# Patient Record
Sex: Male | Born: 1960 | Race: White | Hispanic: No | Marital: Married | Smoking: Never smoker
Health system: Southern US, Community
[De-identification: ages and names within clinical notes are randomized; demographics above are authoritative.]

## PROBLEM LIST (undated history)

## (undated) DIAGNOSIS — I10 Essential (primary) hypertension: Secondary | ICD-10-CM

## (undated) DIAGNOSIS — M1990 Unspecified osteoarthritis, unspecified site: Secondary | ICD-10-CM

## (undated) DIAGNOSIS — E119 Type 2 diabetes mellitus without complications: Secondary | ICD-10-CM

## (undated) DIAGNOSIS — F1290 Cannabis use, unspecified, uncomplicated: Secondary | ICD-10-CM

## (undated) DIAGNOSIS — F419 Anxiety disorder, unspecified: Secondary | ICD-10-CM

## (undated) DIAGNOSIS — G4730 Sleep apnea, unspecified: Secondary | ICD-10-CM

## (undated) DIAGNOSIS — G4733 Obstructive sleep apnea (adult) (pediatric): Secondary | ICD-10-CM

## (undated) DIAGNOSIS — E669 Obesity, unspecified: Secondary | ICD-10-CM

## (undated) DIAGNOSIS — R072 Precordial pain: Secondary | ICD-10-CM

## (undated) DIAGNOSIS — M797 Fibromyalgia: Secondary | ICD-10-CM

## (undated) DIAGNOSIS — E785 Hyperlipidemia, unspecified: Secondary | ICD-10-CM

## (undated) HISTORY — PX: TONSILLECTOMY: SUR1361

## (undated) HISTORY — PX: KNEE ARTHROSCOPY: SUR90

## (undated) HISTORY — PX: OTHER SURGICAL HISTORY: SHX169

## (undated) HISTORY — PX: UVULECTOMY: SHX2631

## (undated) HISTORY — PX: HERNIA REPAIR: SHX51

---

## 1987-11-20 DIAGNOSIS — G4733 Obstructive sleep apnea (adult) (pediatric): Secondary | ICD-10-CM

## 1987-11-20 HISTORY — DX: Obstructive sleep apnea (adult) (pediatric): G47.33

## 2002-07-09 ENCOUNTER — Ambulatory Visit (HOSPITAL_COMMUNITY): Admission: RE | Admit: 2002-07-09 | Discharge: 2002-07-09 | Payer: Self-pay | Admitting: Pediatrics

## 2002-07-09 ENCOUNTER — Encounter: Payer: Self-pay | Admitting: Pediatrics

## 2003-02-02 ENCOUNTER — Ambulatory Visit: Admission: RE | Admit: 2003-02-02 | Discharge: 2003-02-02 | Payer: Self-pay | Admitting: Otolaryngology

## 2003-07-28 ENCOUNTER — Encounter (INDEPENDENT_AMBULATORY_CARE_PROVIDER_SITE_OTHER): Payer: Self-pay | Admitting: *Deleted

## 2003-07-28 ENCOUNTER — Ambulatory Visit (HOSPITAL_COMMUNITY): Admission: RE | Admit: 2003-07-28 | Discharge: 2003-07-29 | Payer: Self-pay | Admitting: Otolaryngology

## 2004-12-12 ENCOUNTER — Ambulatory Visit (HOSPITAL_COMMUNITY): Admission: RE | Admit: 2004-12-12 | Discharge: 2004-12-12 | Payer: Self-pay | Admitting: Pediatrics

## 2004-12-18 ENCOUNTER — Ambulatory Visit (HOSPITAL_COMMUNITY): Admission: RE | Admit: 2004-12-18 | Discharge: 2004-12-18 | Payer: Self-pay | Admitting: Pediatrics

## 2004-12-30 ENCOUNTER — Ambulatory Visit: Admission: RE | Admit: 2004-12-30 | Discharge: 2004-12-30 | Payer: Self-pay | Admitting: Otolaryngology

## 2004-12-30 ENCOUNTER — Ambulatory Visit: Payer: Self-pay | Admitting: Pulmonary Disease

## 2006-07-15 ENCOUNTER — Ambulatory Visit (HOSPITAL_COMMUNITY): Admission: RE | Admit: 2006-07-15 | Discharge: 2006-07-15 | Payer: Self-pay | Admitting: Pediatrics

## 2006-07-17 ENCOUNTER — Ambulatory Visit (HOSPITAL_COMMUNITY): Admission: RE | Admit: 2006-07-17 | Discharge: 2006-07-17 | Payer: Self-pay | Admitting: Pediatrics

## 2007-08-18 ENCOUNTER — Ambulatory Visit: Payer: Self-pay | Admitting: Orthopedic Surgery

## 2007-09-22 ENCOUNTER — Ambulatory Visit: Payer: Self-pay | Admitting: Orthopedic Surgery

## 2007-09-22 DIAGNOSIS — M722 Plantar fascial fibromatosis: Secondary | ICD-10-CM | POA: Insufficient documentation

## 2007-09-22 DIAGNOSIS — M25569 Pain in unspecified knee: Secondary | ICD-10-CM | POA: Insufficient documentation

## 2008-03-01 ENCOUNTER — Emergency Department (HOSPITAL_COMMUNITY): Admission: EM | Admit: 2008-03-01 | Discharge: 2008-03-01 | Payer: Self-pay | Admitting: Emergency Medicine

## 2009-11-19 HISTORY — PX: COLONOSCOPY: SHX174

## 2009-11-22 ENCOUNTER — Encounter: Payer: Self-pay | Admitting: Internal Medicine

## 2009-11-30 ENCOUNTER — Ambulatory Visit: Payer: Self-pay | Admitting: Internal Medicine

## 2009-11-30 ENCOUNTER — Ambulatory Visit (HOSPITAL_COMMUNITY): Admission: RE | Admit: 2009-11-30 | Discharge: 2009-11-30 | Payer: Self-pay | Admitting: Internal Medicine

## 2010-12-10 ENCOUNTER — Encounter: Payer: Self-pay | Admitting: Pediatrics

## 2010-12-19 NOTE — Letter (Signed)
Summary: Internal Other Domingo Dimes  Internal Other Domingo Dimes   Imported By: Cloria Spring LPN 60/45/4098 11:91:47  _____________________________________________________________________  External Attachment:    Type:   Image     Comment:   External Document

## 2011-04-06 NOTE — Procedures (Signed)
Lance Frazier, Lance Frazier              ACCOUNT NO.:  [id number]   MEDICAL RECORD NO.:  [id number]          PATIENT TYPE:  OUT   LOCATION:  SLEEP LAB                     FACILITY:  [facility]   PHYSICIAN:  Marcelyn Bruins, M.D. [facility] DATE OF BIRTH:  1961/11/13   DATE OF STUDY:  12/30/2004                              NOCTURNAL POLYSOMNOGRAM   REFERRING PHYSICIAN:  Suzanna Obey, MD   INDICATION FOR THE STUDY:  Hypersomnia with sleep apnea.   SLEEP ARCHITECTURE:  The patient had a total sleep time of 350 minutes with  a sleep efficiency of 83%. There was decreased slow wave sleep and adequate  REM. Sleep onset latency was prolonged at 63 minutes and REM latency was  normal.   IMPRESSION:  1.  Split-night study reveals severe obstructive sleep apnea/hypopnea      syndrome with 112 obstructive events noted in the first 63 minutes of      sleep. This gave the patient a respiratory disturbance index of 108      events per hour with O2 desaturation as low as 82%. Events were not      positional however, they were associated with loud snoring. By protocol      the patient was placed on a large ResMed continuous positive airway      pressure mask and titration was initiated. The patient was increased as      high as 9 cm with definite breakthrough events noted. I would consider a      final treatment pressure of 10-11 cm.  2.  No clinically significant cardiac arrhythmia.  3.  Large numbers of leg jerks with mild to moderate sleep disruption.      Clinical correlation is suggested.      KC/MEDQ  D:  01/15/2005 15:54:38  T:  01/15/2005 18:40:52  Job:  [id number]

## 2011-04-06 NOTE — Procedures (Signed)
NAME:  Lance Frazier, Lance Frazier NO.:  [id number]   MEDICAL RECORD NO.:  [id number]          PATIENT TYPE:  OUT   LOCATION:  RAD                           FACILITY:  [facility]   PHYSICIAN:  Darlin Priestly, MD  DATE OF BIRTH:  [birth year]   DATE OF PROCEDURE:  07/17/2006  DATE OF DISCHARGE:                                  ECHOCARDIOGRAM   [name] is a 50 year old male patient of Dr. Milford Cage with a history of  possible stroke. He is now referred for a 2-D echocardiogram to evaluate LV  function and valvular structure to rule out possible source of embolus.   The aortic is within normal limits at 3.9 cm.   The left atrium is within normal limits at 4.0 cm.   There were no clots seen. The patient appears to be sinus rhythm during the  procedure.   ____________ thickened at 1.4 and 1.3 cm, respectively.   The aortic valve appears to be structurally normal.   The mitral valve appears to be structurally normal with trivial mitral  regurgitation.   Structurally normal pulmonic valve with trivial pulmonic regurgitation.   Structurally normal tricuspid valve with trivial tricuspid regurgitation.   Left ventricular internal dimensions within normal limits at ____________  2.7 cm, respectively. There is good overall left ventricular function,  estimated EF of 60%.   Normal LV size and systolic function.   CONCLUSION:  1. Mild concentric left ventricular hypertrophy with normal left      ventricular systolic function estimated at 60%.  2. Structurally normal aortic valve.  3. Structurally normal mitral valve with trivial mitral regurgitation.  4. Trivial pulmonic regurgitation.  5. Structurally normal tricuspid valve with trivial tricuspid      regurgitation.  6. Normal right ventricular size and systolic function.  7. No intracardiac evidence of mass or thrombus present.      Darlin Priestly, MD  Electronically Signed     RHM/MEDQ  D:  07/17/2006  T:  07/18/2006   Job:  [id number]

## 2011-04-06 NOTE — Op Note (Signed)
NAME:  Lance Frazier, Lance Frazier                        ACCOUNT NO.:  [id number]   MEDICAL RECORD NO.:  [id number]                   PATIENT TYPE:  OIB   LOCATION:  2550                                 FACILITY:  [facility]   PHYSICIAN:  Suzanna Obey, M.D.                    DATE OF BIRTH:  10/01/1961   DATE OF PROCEDURE:  07/28/2003  DATE OF DISCHARGE:                                 OPERATIVE REPORT   PREOPERATIVE DIAGNOSIS:  Obstructive sleep apnea, deviated septum, turbinate  hypertrophy.   POSTOPERATIVE DIAGNOSIS:  Obstructive sleep apnea, deviated septum,  turbinate hypertrophy.   OPERATION PERFORMED:  Septoplasty, submucous resection of inferior  turbinates, uvulopalatopharyngoplasty, tonsillectomy.   SURGEON:  Suzanna Obey, M.D.   ANESTHESIA:  General endotracheal tube.   ESTIMATED BLOOD LOSS:  Less than approximately 50mL.   INDICATIONS FOR PROCEDURE:  The patient is a 50 year old who has had a  problem with obstructive breathing and disturbed sleep which he had an RDI  of 25 on his sleep study with oxygen desaturation.  He has not been able to  tolerate CPAP and now is a candidate for surgery.  He does have anatomy  amenable to correction.  He was informed of the risks and benefits of the  procedure including bleeding, infection, perforation, change in the external  appearance of the nose.  Chronic crusting and drying, numbness of the teeth,  velopharyngeal insufficiency, change in the voice, chronic pain, and risks  of the anesthetic.  All questions were answered and consent was obtained.   DESCRIPTION OF PROCEDURE:  The patient was taken to the operating room and  placed in supine position.  After adequate general endotracheal tube  anesthesia, he was placed in the supine position, prepped and draped in the  usual sterile fashion.  The right hemitransfixion incision was performed  after injecting with 1% lidocaine with 1:100,000 epinephrine in the septum  and inferior turbinates.   The right hemitransfixion incision was performed  raising a mucoperichondrial and mucoperiosteal flap.  The cartilage was  divided about 1 cm posterior to the caudal strut and the cartilage was  removed with a Therapist, nutritional.  The deviated portion of the bone was removed  with Jansen-Middleton forceps.  The inferior spur was removed with the 4 mm  osteotome.  This corrected the septal deflection.  The turbinates were  infractured, midline incision made with a 15 blade.  Mucosal flap elevated  superiorly.  The inferior mucosa and bone were removed with turbinate  scissors.  The edge was cauterized with suction cautery and the turbinated  was outfractured bilaterally and the flap was laid back down over the raw  surface.  The hemitransfixion incision was closed with interrupted 4-0  chromic and 4-0 plain gut quilting stitch was placed in the septum.  Telfa  rolls soaked in bacitracin were placed into the nose bilaterally and secured  with a  3-0 nylon.  The operation was directed to his mouth where a Crowe-  Davis mouth gag was inserted, retracted and suspended from a Mayo stand.  The patient had large tonsils and a very thick palate and uvula.  Electrocautery was used to dissect above the uvula and then dissecting into  the left anterior tonsillar pillar identifying the capsular tonsil and  removing it with electrocautery dissection.  The dissection was carried  across the palate into the right side.  Again the tonsil removed.  This  entire specimen, tonsils, palate and uvula were removed as an en bloc  specimen.  Suction cautery  was used to obtain hemostasis.  The palate and tonsillar fossa was then  closed with interrupted 3-0 Vicryl.  There was good hemostasis.  The CroweEarlene Plater was released and resuspended.  There was hemostasis present in all  locations.  The patient was then awakened and brought to recovery in stable  condition.  Counts were correct.                                                 Suzanna Obey, M.D.    Cordelia Pen  D:  07/28/2003  T:  07/28/2003  Job:  [id number]   cc:   Dr. Idelle Jo, Sidney Ace

## 2011-05-21 ENCOUNTER — Other Ambulatory Visit (HOSPITAL_COMMUNITY): Payer: Self-pay | Admitting: Pediatrics

## 2011-05-21 ENCOUNTER — Ambulatory Visit (HOSPITAL_COMMUNITY)
Admission: RE | Admit: 2011-05-21 | Discharge: 2011-05-21 | Disposition: A | Discharge status: Home / Self Care | Payer: BC Managed Care – PPO | Source: Ambulatory Visit | Attending: Pediatrics | Admitting: Pediatrics

## 2011-05-21 DIAGNOSIS — M79609 Pain in unspecified limb: Secondary | ICD-10-CM | POA: Insufficient documentation

## 2011-05-21 DIAGNOSIS — M7989 Other specified soft tissue disorders: Secondary | ICD-10-CM

## 2011-05-21 DIAGNOSIS — M79669 Pain in unspecified lower leg: Secondary | ICD-10-CM

## 2012-02-05 ENCOUNTER — Other Ambulatory Visit: Payer: Self-pay

## 2012-02-05 ENCOUNTER — Encounter (HOSPITAL_COMMUNITY)
Admission: RE | Admit: 2012-02-05 | Discharge: 2012-02-05 | Disposition: A | Discharge status: Home / Self Care | Payer: BC Managed Care – PPO | Source: Ambulatory Visit | Attending: General Surgery | Admitting: General Surgery

## 2012-02-05 ENCOUNTER — Encounter (HOSPITAL_COMMUNITY): Payer: Self-pay | Admitting: Pharmacy Technician

## 2012-02-05 ENCOUNTER — Encounter (HOSPITAL_COMMUNITY): Payer: Self-pay

## 2012-02-05 HISTORY — DX: Fibromyalgia: M79.7

## 2012-02-05 HISTORY — DX: Essential (primary) hypertension: I10

## 2012-02-05 HISTORY — DX: Anxiety disorder, unspecified: F41.9

## 2012-02-05 HISTORY — DX: Sleep apnea, unspecified: G47.30

## 2012-02-05 HISTORY — DX: Unspecified osteoarthritis, unspecified site: M19.90

## 2012-02-05 LAB — SURGICAL PCR SCREEN
MRSA, PCR: NEGATIVE
Staphylococcus aureus: POSITIVE — AB

## 2012-02-05 LAB — DIFFERENTIAL
Basophils Absolute: 0 10*3/uL (ref 0.0–0.1)
Basophils Relative: 0 % (ref 0–1)
Lymphocytes Relative: 35 % (ref 12–46)
Neutro Abs: 3.7 10*3/uL (ref 1.7–7.7)
Neutrophils Relative %: 53 % (ref 43–77)

## 2012-02-05 LAB — BASIC METABOLIC PANEL
Calcium: 9.4 mg/dL (ref 8.4–10.5)
Chloride: 102 mEq/L (ref 96–112)
Creatinine, Ser: 0.99 mg/dL (ref 0.50–1.35)
GFR calc Af Amer: >90 mL/min (ref >90)
GFR calc non Af Amer: >90 mL/min (ref >90)

## 2012-02-05 LAB — CBC
Platelets: 182 10*3/uL (ref 150–400)
RDW: 13 % (ref 11.5–15.5)
WBC: 7 10*3/uL (ref 4.0–10.5)

## 2012-02-05 NOTE — H&P (Signed)
  NTS SOAP Note  Vital Signs:  Vitals as of: 02/05/2012: Systolic 154: Diastolic 94: Heart Rate 80: Temp 97.45F: Height 48ft 7in: Weight 266Lbs 0 Ounces: OFC 0in: Respiratory Rate 0: O2 Saturation 0: Pain Level 7: BMI 42  BMI : 41.66 kg/m2  Subjective: This 51 Years 31 Months old Male presents for of  HEMORRHOIDS: ,Has had hemorrhoidal issues for many years.  Had thrombosed hemorrhoid incised in the past.  Is have ongoing issues with hemorrhoids.  Has some blood on toilet paper over the past few weeks.  Had a TCS several years ago which was unremarkable.  Review of Symptoms:  Constitutional:unremarkable Head:unremarkable Eyes:unremarkable Nose/Mouth/Throat:unremarkable Cardiovascular:unremarkable Respiratory:unremarkable Gastrointestinal:unremarkable Genitourinary:unremarkable Musculoskeletal:unremarkable Skin:unremarkable Hematolgic/Lymphatic:unremarkable Allergic/Immunologic:unremarkable   Past Medical History:Reviewed   Past Medical History  Surgical History: umbilical herniorrhaphy Medical Problems:  Diabetes, High Blood pressure Allergies: nkda Medications: metformin, alprazolam, celebrex, terazosin, celebrex, furosemide, vitamins, fish oil   Social History:Reviewed   Social History  Preferred Language: English ([place]) Race:  White Ethnicity: Not Hispanic / Latino Age: 51 Years 3 Months Marital Status:  M Alcohol:  No Recreational drug(s):  No   Smoking Status: Never smoker reviewed on 02/05/2012  Family History:Reviewed   Family History              Father:  Coronary Artery Disease             Mother:  Cancer-kidney    Objective Information: General:Well appearing, well nourished in no distress. Head:Atraumatic; no masses; no abnormalities Heart:RRR, no murmur or gallop.  Normal S1, S2.  No S3, S4.  Lungs:CTA bilaterally, no wheezes, rhonchi, rales.  Breathing  unlabored. Abdomen:Soft, NT/ND, no HSM, no masses. Prolapsed hemorrrhoid with mucosa present along the right lateral aspect of the anus.  No active bleeding noted.  Assessment:Hemorrhoidal disease  Diagnosis &amp; Procedure: DiagnosisCode: 455.0, ProcedureCode: 40981,    Plan:Scheduled for hemorrhoidectomy on 02/06/12.   Patient Education:Alternative treatments to surgery were discussed with patient (and family).Risks and benefits  of procedure were fully explained to the patient (and family) who gave informed consent. Patient/family questions were addressed.  Follow-up:Pending Surgery

## 2012-02-05 NOTE — Patient Instructions (Addendum)
18 Lance Frazier  02/05/2012   Your procedure is scheduled on:  02/06/2012  Report to [facility] at  1015  AM.  Call this number if you have problems the morning of surgery: [phone]   Remember:   Do not eat food:After Midnight.  May have clear liquids:until Midnight .  Clear liquids include soda, tea, black coffee, apple or grape juice, broth.  Take these medicines the morning of surgery with A SIP OF WATER: norco,hytrin,xanax   Do not wear jewelry, make-up or nail polish.  Do not wear lotions, powders, or perfumes. You may wear deodorant.  Do not shave 48 hours prior to surgery.  Do not bring valuables to the hospital.  Contacts, dentures or bridgework may not be worn into surgery.  Leave suitcase in the car. After surgery it may be brought to your room.  For patients admitted to the hospital, checkout time is 11:00 AM the day of discharge.   Patients discharged the day of surgery will not be allowed to drive home.  Name and phone number of your driver: family  Special Instructions: CHG Shower Use Special Wash: 1/2 bottle night before surgery and 1/2 bottle morning of surgery.   Please read over the following fact sheets that you were given: Pain Booklet, MRSA Information, Surgical Site Infection Prevention, Anesthesia Post-op Instructions and Care and Recovery After Surgery Hemorrhoidectomy Hemorrhoidectomy is surgery to remove hemorrhoids. Hemorrhoids are veins that have become swollen in the rectum. The rectum is the area from the bottom end of the intestines to the opening where bowel movements leave the body. Hemorrhoids can be uncomfortable. They can cause itching, bleeding and pain if a blood clot forms in them (thrombose). If hemorrhoids are small, surgery may not be needed. But if they cover a larger area, surgery is usually suggested.  LET YOUR CAREGIVER KNOW ABOUT:   Any allergies.   All medications you are taking, including:   Herbs, eyedrops, over-the-counter  medications and creams.   Blood thinners (anticoagulants), aspirin or other drugs that could affect blood clotting.   Use of steroids (by mouth or as creams).   Previous problems with anesthetics, including local anesthetics.   Possibility of pregnancy, if this applies.   Any history of blood clots.   Any history of bleeding or other blood problems.   Previous surgery.   Smoking history.   Other health problems.  RISKS AND COMPLICATIONS All surgery carries some risk. However, hemorrhoid surgery usually goes smoothly. Possible complications could include:  Urinary retention.   Bleeding.   Infection.   A painful incision.   A reaction to the anesthesia (this is not common).  BEFORE THE PROCEDURE   Stop using aspirin and non-steroidal anti-inflammatory drugs (NSAIDs) for pain relief. This includes prescription drugs and over-the-counter drugs such as ibuprofen and naproxen. Also stop taking vitamin E. If possible, do this two weeks before your surgery.   If you take blood-thinners, ask your healthcare provider when you should stop taking them.   You will probably have blood and urine tests done several days before your surgery.   Do not eat or drink for about 8 hours before the surgery.   Arrive at least an hour before the surgery, or whenever your surgeon recommends. This will give you time to check in and fill out any needed paperwork.   Hemorrhoidectomy is often an outpatient procedure. This means you will be able to go home the same day. Sometimes, though, people stay overnight in the  hospital after the procedure. Ask your surgeon what to expect. Either way, make arrangements in advance for someone to drive you home.  PROCEDURE   The preparation:   You will change into a hospital gown.   You will be given an IV. A needle will be inserted in your arm. Medication can flow directly into your body through this needle.   You might be given an enema to clear your  rectum.   Once in the operating room, you will probably lie on your side or be repositioned later to lying on your stomach.   You will be given anesthesia (medication) so you will not feel anything during the surgery. The surgery often is done with local anesthesia (the area near the hemorrhoids will be numb and you will be drowsy but awake). Sometimes, general anesthesia is used (you will be asleep during the procedure).   The procedure:   There are a few different procedures for hemorrhoids. Be sure to ask you surgeon about the procedure, the risks and benefits.   Be sure to ask about what you need to do to take care of the wound, if there is one.  AFTER THE PROCEDURE  You will stay in a recovery area until the anesthesia has worn off. Your blood pressure and pulse will be checked every so often.   You may feel a lot of pain in the area of the rectum.   Take all pain medication prescribed by your surgeon. Ask before taking any over-the-counter pain medicines.   Sometimes sitting in a warm bath can help relieve your pain.   To make sure you have bowel movements without straining:   You will probably need to take stool softeners (usually a pill) for a few days.   You should drink 8 to 10 glasses of water each day.   Your activity will be restricted for awhile. Ask your caregiver for a list of what you should and should not do while you recover.  Document Released: 09/02/2009 Document Revised: 10/25/2011 Document Reviewed: 09/02/2009 [facility] Patient Information 2012 [place], [place].PATIENT INSTRUCTIONS POST-ANESTHESIA  IMMEDIATELY FOLLOWING SURGERY:  Do not drive or operate machinery for the first twenty four hours after surgery.  Do not make any important decisions for twenty four hours after surgery or while taking narcotic pain medications or sedatives.  If you develop intractable nausea and vomiting or a severe headache please notify your doctor immediately.  FOLLOW-UP:  Please  make an appointment with your surgeon as instructed. You do not need to follow up with anesthesia unless specifically instructed to do so.  WOUND CARE INSTRUCTIONS (if applicable):  Keep a dry clean dressing on the anesthesia/puncture wound site if there is drainage.  Once the wound has quit draining you may leave it open to air.  Generally you should leave the bandage intact for twenty four hours unless there is drainage.  If the epidural site drains for more than 36-48 hours please call the anesthesia department.  QUESTIONS?:  Please feel free to call your physician or the hospital operator if you have any questions, and they will be happy to assist you.     [name] Anesthesia Department [address] [place] [phone]

## 2012-02-06 ENCOUNTER — Encounter (HOSPITAL_COMMUNITY): Payer: Self-pay | Admitting: Anesthesiology

## 2012-02-06 ENCOUNTER — Ambulatory Visit (HOSPITAL_COMMUNITY)
Admission: RE | Admit: 2012-02-06 | Discharge: 2012-02-06 | Disposition: A | Discharge status: Home / Self Care | Payer: BC Managed Care – PPO | Source: Ambulatory Visit | Attending: General Surgery | Admitting: General Surgery

## 2012-02-06 ENCOUNTER — Encounter (HOSPITAL_COMMUNITY)
Admission: RE | Disposition: A | Discharge status: Home / Self Care | Payer: Self-pay | Source: Ambulatory Visit | Attending: General Surgery

## 2012-02-06 ENCOUNTER — Ambulatory Visit (HOSPITAL_COMMUNITY): Payer: BC Managed Care – PPO | Admitting: Anesthesiology

## 2012-02-06 ENCOUNTER — Encounter (HOSPITAL_COMMUNITY): Payer: Self-pay | Admitting: *Deleted

## 2012-02-06 DIAGNOSIS — Z01812 Encounter for preprocedural laboratory examination: Secondary | ICD-10-CM | POA: Insufficient documentation

## 2012-02-06 DIAGNOSIS — K602 Anal fissure, unspecified: Secondary | ICD-10-CM | POA: Insufficient documentation

## 2012-02-06 DIAGNOSIS — K648 Other hemorrhoids: Secondary | ICD-10-CM | POA: Insufficient documentation

## 2012-02-06 DIAGNOSIS — E119 Type 2 diabetes mellitus without complications: Secondary | ICD-10-CM | POA: Insufficient documentation

## 2012-02-06 DIAGNOSIS — Z01810 Encounter for preprocedural cardiovascular examination: Secondary | ICD-10-CM | POA: Insufficient documentation

## 2012-02-06 DIAGNOSIS — K644 Residual hemorrhoidal skin tags: Secondary | ICD-10-CM | POA: Insufficient documentation

## 2012-02-06 DIAGNOSIS — I10 Essential (primary) hypertension: Secondary | ICD-10-CM | POA: Insufficient documentation

## 2012-02-06 HISTORY — PX: HEMORRHOID SURGERY: SHX153

## 2012-02-06 LAB — GLUCOSE, CAPILLARY: Glucose-Capillary: 118 mg/dL — ABNORMAL HIGH (ref 70–99)

## 2012-02-06 SURGERY — HEMORRHOIDECTOMY
Anesthesia: General | Site: Rectum | Wound class: Clean Contaminated

## 2012-02-06 MED ORDER — PROPOFOL 10 MG/ML IV BOLUS
INTRAVENOUS | Status: DC | PRN
  Administered 2012-02-06: 200 mg via INTRAVENOUS

## 2012-02-06 MED ORDER — LIDOCAINE VISCOUS 2 % MT SOLN
OROMUCOSAL | Status: AC
  Filled 2012-02-06: qty 15

## 2012-02-06 MED ORDER — FENTANYL CITRATE 0.05 MG/ML IJ SOLN
INTRAMUSCULAR | Status: DC | PRN
  Administered 2012-02-06: 50 ug via INTRAVENOUS
  Administered 2012-02-06 (×2): 25 ug via INTRAVENOUS
  Administered 2012-02-06 (×2): 50 ug via INTRAVENOUS

## 2012-02-06 MED ORDER — DEXAMETHASONE SODIUM PHOSPHATE 4 MG/ML IJ SOLN
INTRAMUSCULAR | Status: AC
  Filled 2012-02-06: qty 1

## 2012-02-06 MED ORDER — ERTAPENEM SODIUM 1 G IJ SOLR
1.0000 g | INTRAMUSCULAR | Status: DC | PRN
  Administered 2012-02-06: 1 g via INTRAVENOUS

## 2012-02-06 MED ORDER — DEXAMETHASONE SODIUM PHOSPHATE 4 MG/ML IJ SOLN
4.0000 mg | Freq: Once | INTRAMUSCULAR | Status: AC
  Administered 2012-02-06: 4 mg via INTRAVENOUS

## 2012-02-06 MED ORDER — ERTAPENEM SODIUM 1 G IJ SOLR: 1.0000 g | INTRAMUSCULAR | Status: DC

## 2012-02-06 MED ORDER — GLYCOPYRROLATE 0.2 MG/ML IJ SOLN
0.2000 mg | Freq: Once | INTRAMUSCULAR | Status: AC
  Administered 2012-02-06: 0.2 mg via INTRAVENOUS

## 2012-02-06 MED ORDER — ENOXAPARIN SODIUM 40 MG/0.4ML ~~LOC~~ SOLN
SUBCUTANEOUS | Status: AC
  Filled 2012-02-06: qty 0.4

## 2012-02-06 MED ORDER — SODIUM CHLORIDE 0.9 % IR SOLN
Status: DC | PRN
  Administered 2012-02-06: 1000 mL

## 2012-02-06 MED ORDER — BUPIVACAINE HCL (PF) 0.5 % IJ SOLN
INTRAMUSCULAR | Status: AC
  Filled 2012-02-06: qty 30

## 2012-02-06 MED ORDER — LIDOCAINE HCL 1 % IJ SOLN
INTRAMUSCULAR | Status: DC | PRN
  Administered 2012-02-06: 50 mg via INTRADERMAL

## 2012-02-06 MED ORDER — MUPIROCIN 2 % EX OINT
TOPICAL_OINTMENT | CUTANEOUS | Status: AC
  Filled 2012-02-06: qty 22

## 2012-02-06 MED ORDER — LIDOCAINE VISCOUS 2 % MT SOLN
OROMUCOSAL | Status: DC | PRN
  Administered 2012-02-06: 1 via OROMUCOSAL

## 2012-02-06 MED ORDER — FENTANYL CITRATE 0.05 MG/ML IJ SOLN
INTRAMUSCULAR | Status: AC
  Administered 2012-02-06: 50 ug via INTRAVENOUS
  Filled 2012-02-06: qty 2

## 2012-02-06 MED ORDER — ENOXAPARIN SODIUM 40 MG/0.4ML ~~LOC~~ SOLN
40.0000 mg | Freq: Once | SUBCUTANEOUS | Status: AC
  Administered 2012-02-06: 40 mg via SUBCUTANEOUS

## 2012-02-06 MED ORDER — SODIUM CHLORIDE 0.9 % IV SOLN
INTRAVENOUS | Status: AC
  Filled 2012-02-06: qty 1

## 2012-02-06 MED ORDER — LACTATED RINGERS IV SOLN
INTRAVENOUS | Status: DC
  Administered 2012-02-06: 11:00:00 via INTRAVENOUS

## 2012-02-06 MED ORDER — BUPIVACAINE HCL (PF) 0.5 % IJ SOLN
INTRAMUSCULAR | Status: DC | PRN
  Administered 2012-02-06: 6 mL

## 2012-02-06 MED ORDER — MIDAZOLAM HCL 2 MG/2ML IJ SOLN
1.0000 mg | INTRAMUSCULAR | Status: DC | PRN
  Administered 2012-02-06 (×2): 2 mg via INTRAVENOUS

## 2012-02-06 MED ORDER — MIDAZOLAM HCL 2 MG/2ML IJ SOLN
INTRAMUSCULAR | Status: AC
  Filled 2012-02-06: qty 2

## 2012-02-06 MED ORDER — FENTANYL CITRATE 0.05 MG/ML IJ SOLN
25.0000 ug | INTRAMUSCULAR | Status: DC | PRN
  Administered 2012-02-06 (×3): 50 ug via INTRAVENOUS

## 2012-02-06 MED ORDER — FENTANYL CITRATE 0.05 MG/ML IJ SOLN
INTRAMUSCULAR | Status: AC
  Filled 2012-02-06: qty 5

## 2012-02-06 MED ORDER — ONDANSETRON HCL 4 MG/2ML IJ SOLN: 4.0000 mg | Freq: Once | INTRAMUSCULAR | Status: DC | PRN

## 2012-02-06 MED ORDER — GLYCOPYRROLATE 0.2 MG/ML IJ SOLN
INTRAMUSCULAR | Status: AC
  Filled 2012-02-06: qty 1

## 2012-02-06 MED ORDER — OXYCODONE-ACETAMINOPHEN 7.5-325 MG PO TABS: 1.0000 | ORAL_TABLET | ORAL | Status: AC | PRN

## 2012-02-06 SURGICAL SUPPLY — 28 items
BAG HAMPER (MISCELLANEOUS) ×2 IMPLANT
CLOTH BEACON ORANGE TIMEOUT ST (SAFETY) ×2 IMPLANT
COVER SURGICAL LIGHT HANDLE (MISCELLANEOUS) ×4 IMPLANT
DECANTER SPIKE VIAL GLASS SM (MISCELLANEOUS) ×2 IMPLANT
DRAPE PROXIMA HALF (DRAPES) ×2 IMPLANT
ELECT REM PT RETURN 9FT ADLT (ELECTROSURGICAL) ×2
ELECTRODE REM PT RTRN 9FT ADLT (ELECTROSURGICAL) ×1 IMPLANT
FORMALIN 10 PREFIL 120ML (MISCELLANEOUS) ×2 IMPLANT
GLOVE BIO SURGEON STRL SZ7.5 (GLOVE) ×2 IMPLANT
GLOVE ECLIPSE 6.5 STRL STRAW (GLOVE) ×1 IMPLANT
GLOVE EXAM NITRILE MD LF STRL (GLOVE) ×1 IMPLANT
GLOVE INDICATOR 7.0 STRL GRN (GLOVE) ×1 IMPLANT
GOWN STRL REIN XL XLG (GOWN DISPOSABLE) ×5 IMPLANT
HEMOSTAT SURGICEL 4X8 (HEMOSTASIS) ×2 IMPLANT
KIT ROOM TURNOVER AP CYSTO (KITS) ×2 IMPLANT
LIGASURE IMPACT 36 18CM CVD LR (INSTRUMENTS) ×1 IMPLANT
MANIFOLD NEPTUNE II (INSTRUMENTS) ×2 IMPLANT
NDL HYPO 25X1 1.5 SAFETY (NEEDLE) ×1 IMPLANT
NEEDLE HYPO 25X1 1.5 SAFETY (NEEDLE) ×2 IMPLANT
NS IRRIG 1000ML POUR BTL (IV SOLUTION) ×2 IMPLANT
PACK PERI GYN (CUSTOM PROCEDURE TRAY) ×2 IMPLANT
PAD ARMBOARD 7.5X6 YLW CONV (MISCELLANEOUS) ×2 IMPLANT
SET BASIN LINEN APH (SET/KITS/TRAYS/PACK) ×2 IMPLANT
SHEARS HARMONIC 9CM CVD (BLADE) IMPLANT
SPONGE GAUZE 4X4 12PLY (GAUZE/BANDAGES/DRESSINGS) ×2 IMPLANT
SUT SILK 0 FSL (SUTURE) ×2 IMPLANT
SUT VIC AB 2-0 CT2 27 (SUTURE) ×2 IMPLANT
SYR CONTROL 10ML LL (SYRINGE) ×2 IMPLANT

## 2012-02-06 NOTE — Interval H&P Note (Signed)
History and Physical Interval Note:  02/06/2012 11:04 AM  Lance Frazier  has presented today for surgery, with the diagnosis of Hemorrhoids  The various methods of treatment have been discussed with the patient and family. After consideration of risks, benefits and other options for treatment, the patient has consented to  Procedure(s) (LRB): HEMORRHOIDECTOMY (N/A) as a surgical intervention .  The patients' history has been reviewed, patient examined, no change in status, stable for surgery.  I have reviewed the patients' chart and labs.  Questions were answered to the patient's satisfaction.     Franky Macho A

## 2012-02-06 NOTE — Anesthesia Preprocedure Evaluation (Signed)
Anesthesia Evaluation  Patient identified by MRN, date of birth, ID band Patient awake    Reviewed: Allergy & Precautions, H&P , NPO status , Patient's Chart, lab work & pertinent test results  Airway Mallampati: I TM Distance: >3 FB Neck ROM: Full    Dental  (+) Teeth Intact   Pulmonary sleep apnea (s/p UVPP) and Continuous Positive Airway Pressure Ventilation ,  breath sounds clear to auscultation        Cardiovascular hypertension, Pt. on medications Rhythm:Regular Rate:Normal     Neuro/Psych Anxiety  Neuromuscular disease    GI/Hepatic   Endo/Other  Diabetes mellitus-, Well Controlled, Type 2, Oral Hypoglycemic AgentsMorbid obesity  Renal/GU      Musculoskeletal  (+) Fibromyalgia -  Abdominal (+) + obese,   Peds  Hematology   Anesthesia Other Findings   Reproductive/Obstetrics                           Anesthesia Physical Anesthesia Plan  ASA: III  Anesthesia Plan: General   Post-op Pain Management:    Induction: Intravenous  Airway Management Planned: LMA  Additional Equipment:   Intra-op Plan:   Post-operative Plan: Extubation in OR  Informed Consent: I have reviewed the patients History and Physical, chart, labs and discussed the procedure including the risks, benefits and alternatives for the proposed anesthesia with the patient or authorized representative who has indicated his/her understanding and acceptance.     Plan Discussed with:   Anesthesia Plan Comments: (Possible GOT if LMA is not satisfactory.)        Anesthesia Quick Evaluation

## 2012-02-06 NOTE — Op Note (Signed)
Patient:  Lance Frazier  DOB:  [birth year]  MRN:  [id number]   Preop Diagnosis:  Hemorrhoidal disease  Postop Diagnosis:  Same, chronic anal fissure  Procedure:  Hemorrhoidectomy  Surgeon:  Franky Macho, M.D.  Anes:  General  Indications:  Patient is a 51 year old white male who presents with hemorrhoidal disease. The risks and benefits of the procedure including bleeding, infection, and recurrence of hemorrhoidal disease were fully explained to the patient, gave informed consent.  Procedure note:  Patient is placed in the lithotomy position after general anesthesia was administered. The perineum was prepped and draped using usual sterile technique with Betadine. Surgical site confirmation was performed.  On anoscopy, the patient had an internal hemorrhoid along with external hemorrhoidal skin tissue at approximately the 5:00 position. These were excised without difficulty using the LigaSure. In addition, the patient had evidence of a chronic anal fissure at the 4:00 position. The tissue was cauterized and the remaining mucosa was brought over this fissure using 2-0 Vicryl interrupted sutures. The was no significant anal strictures. No other mass lesions were noted. 0.5% Sensorcaine was instilled the surrounding perineum. Surgicel and Viscous Xylocaine rectal packing was then placed.  All tape and needle counts were correct the end of the procedure. Patient was awakened and transferred to PACU in stable condition.  Complications:  None  EBL:  Minimal  Specimen:  Hemorrhoidal tissue

## 2012-02-06 NOTE — Discharge Instructions (Signed)
Hemorrhoidectomy Care After Hemorrhoidectomy is the removal of enlarged (dilated) veins around the rectum. Until the surgical areas are healed, control of pain and avoiding constipation are the greatest challenges for patients.  For as long as 24 hours after receiving an anesthetic (the medication that made you sleep), and while taking narcotic pain relievers, you may feel dizzy, weak and drowsy. For that reason, the following information applies to the first 24-hour period following surgery, and continues for as long as you are taking narcotic pain medications.  Do not drive a car, ride a bicycle, participate in activities in which you could be hurt. Do not take public transportation until you are off narcotic pain medications and until your caregiver says it is okay.   Do not drink alcohol, take tranquilizers, or medications not prescribed or allowed by your surgical caregiver.   Do not sign important papers or contracts for at least 24 hours or while taking narcotic medications.   Have a responsible person with you for 24 hours.  RISKS AND COMPLICATIONS Some problems that may occur following this procedure include:  Infection. A germ starts growing in the tissue surrounding the site operated on. This can usually be treated with antibiotics.   Damage to the rectal sphincter could occur. This is the muscle that opens in your anus to allow a bowel movement. This could cause incontinence. This is uncommon.   Bleeding following surgery can be a complication of almost any surgery. Your surgeon takes every precaution to keep this from happening.   Complications of anesthesia.  HOME CARE INSTRUCTIONS  Avoid straining when having bowel movements.   Avoid heavy lifting (more than 10 pounds (4.5 kilograms)).   Only take over-the-counter or prescription medicines for pain, discomfort, or fever as directed by your caregiver.   Take hot sitz baths for 20 to 30 minutes, 3 to 4 times per day.   To  keep swelling down, apply an ice pack for twenty minutes three to four times per day between sitz baths. Use a towel between your skin and the ice pack. Do not do this if it causes too much discomfort.   Keep anal area clean and dry. Following a bowel movement, you can gently wash the area with tucks (available for purchase at a drugstore) or cotton swabs. Gently pat the area dry. Do not rub the area.   Eat a well balanced diet and drink 6 to 8 glasses of water every day to avoid constipation. A bulk laxative may be also be helpful.  SEEK MEDICAL CARE IF:   You have increasing pain or tenderness near or in the surgical site.   You are unable to eat or drink.   You develop nausea or vomiting.   You develop uncontrolled bleeding such as soaking two to three pads in one hour.   You have constipation, not helped by changing your diet or increasing your fluid intake. Pain medications are a common cause of constipation.   You have pain and redness (inflammation) extending outside the area of your surgery.   You develop an unexplained oral temperature above 102 F (38.9 C), or any other signs of infection.   You have any other questions or concerns following surgery.  Document Released: 01/26/2004 Document Revised: 10/25/2011 Document Reviewed: 04/25/2009 [employer] Patient Information 2012 [employer]. 

## 2012-02-06 NOTE — Transfer of Care (Signed)
Immediate Anesthesia Transfer of Care Note  Patient: Lance Frazier  Procedure(s) Performed: Procedure(s) (LRB): HEMORRHOIDECTOMY (N/A)  Patient Location: PACU  Anesthesia Type: General  Level of Consciousness: awake, alert  and oriented  Airway & Oxygen Therapy: Patient Spontanous Breathing  Post-op Assessment: Report given to PACU RN  Post vital signs: Reviewed and stable  Complications: No apparent anesthesia complications

## 2012-02-06 NOTE — Anesthesia Procedure Notes (Addendum)
Performed by: Glynn Octave E   Procedure Name: LMA Insertion Date/Time: 02/06/2012 11:21 AM Performed by: Moshe Salisbury Pre-anesthesia Checklist: Patient identified, Patient being monitored, Emergency Drugs available, Timeout performed and Suction available Patient Re-evaluated:Patient Re-evaluated prior to inductionOxygen Delivery Method: Circle System Utilized Preoxygenation: Pre-oxygenation with 100% oxygen Intubation Type: IV induction Ventilation: Mask ventilation without difficulty LMA: LMA inserted LMA Size: 4.0 and 5.0 Number of attempts: 1 Placement Confirmation: positive ETCO2 and breath sounds checked- equal and bilateral

## 2012-02-06 NOTE — Anesthesia Postprocedure Evaluation (Signed)
  Anesthesia Post-op Note  Patient: Lance Frazier  Procedure(s) Performed: Procedure(s) (LRB): HEMORRHOIDECTOMY (N/A)  Patient Location: PACU  Anesthesia Type: General  Level of Consciousness: awake, alert  and oriented  Airway and Oxygen Therapy: Patient Spontanous Breathing and Patient connected to face mask oxygen  Post-op Pain: none  Post-op Assessment: Post-op Vital signs reviewed, Patient's Cardiovascular Status Stable, Respiratory Function Stable, Patent Airway and No signs of Nausea or vomiting  Post-op Vital Signs: Reviewed and stable  Complications: No apparent anesthesia complications

## 2012-02-08 ENCOUNTER — Encounter (HOSPITAL_COMMUNITY): Payer: Self-pay | Admitting: General Surgery

## 2012-05-27 ENCOUNTER — Encounter (HOSPITAL_COMMUNITY): Payer: Self-pay | Admitting: *Deleted

## 2012-05-27 ENCOUNTER — Observation Stay (HOSPITAL_COMMUNITY)
Admission: EM | Admit: 2012-05-27 | Discharge: 2012-05-28 | Disposition: A | Discharge status: Home / Self Care | Payer: BC Managed Care – PPO | Attending: Internal Medicine | Admitting: Internal Medicine

## 2012-05-27 ENCOUNTER — Emergency Department (HOSPITAL_COMMUNITY): Payer: BC Managed Care – PPO

## 2012-05-27 DIAGNOSIS — M722 Plantar fascial fibromatosis: Secondary | ICD-10-CM

## 2012-05-27 DIAGNOSIS — I10 Essential (primary) hypertension: Secondary | ICD-10-CM | POA: Diagnosis present

## 2012-05-27 DIAGNOSIS — I498 Other specified cardiac arrhythmias: Secondary | ICD-10-CM | POA: Insufficient documentation

## 2012-05-27 DIAGNOSIS — E785 Hyperlipidemia, unspecified: Secondary | ICD-10-CM | POA: Diagnosis present

## 2012-05-27 DIAGNOSIS — M25569 Pain in unspecified knee: Secondary | ICD-10-CM

## 2012-05-27 DIAGNOSIS — IMO0001 Reserved for inherently not codable concepts without codable children: Secondary | ICD-10-CM

## 2012-05-27 DIAGNOSIS — R072 Precordial pain: Secondary | ICD-10-CM | POA: Diagnosis present

## 2012-05-27 DIAGNOSIS — R079 Chest pain, unspecified: Principal | ICD-10-CM

## 2012-05-27 DIAGNOSIS — M797 Fibromyalgia: Secondary | ICD-10-CM | POA: Diagnosis present

## 2012-05-27 DIAGNOSIS — F411 Generalized anxiety disorder: Secondary | ICD-10-CM | POA: Insufficient documentation

## 2012-05-27 DIAGNOSIS — E1169 Type 2 diabetes mellitus with other specified complication: Secondary | ICD-10-CM | POA: Diagnosis present

## 2012-05-27 DIAGNOSIS — E669 Obesity, unspecified: Secondary | ICD-10-CM | POA: Diagnosis present

## 2012-05-27 DIAGNOSIS — G4733 Obstructive sleep apnea (adult) (pediatric): Secondary | ICD-10-CM | POA: Diagnosis present

## 2012-05-27 DIAGNOSIS — E119 Type 2 diabetes mellitus without complications: Secondary | ICD-10-CM

## 2012-05-27 DIAGNOSIS — F1290 Cannabis use, unspecified, uncomplicated: Secondary | ICD-10-CM | POA: Diagnosis present

## 2012-05-27 DIAGNOSIS — F419 Anxiety disorder, unspecified: Secondary | ICD-10-CM | POA: Diagnosis present

## 2012-05-27 HISTORY — DX: Precordial pain: R07.2

## 2012-05-27 HISTORY — DX: Cannabis use, unspecified, uncomplicated: F12.90

## 2012-05-27 HISTORY — DX: Obstructive sleep apnea (adult) (pediatric): G47.33

## 2012-05-27 HISTORY — DX: Hyperlipidemia, unspecified: E78.5

## 2012-05-27 HISTORY — DX: Obesity, unspecified: E66.9

## 2012-05-27 LAB — D-DIMER, QUANTITATIVE: D-Dimer, Quant: 0.46 ug/mL-FEU (ref 0.00–0.48)

## 2012-05-27 LAB — BASIC METABOLIC PANEL
CO2: 26 mEq/L (ref 19–32)
Calcium: 9.1 mg/dL (ref 8.4–10.5)
GFR calc non Af Amer: >90 mL/min (ref >90)
Potassium: 4.3 mEq/L (ref 3.5–5.1)
Sodium: 138 mEq/L (ref 135–145)

## 2012-05-27 LAB — CBC WITH DIFFERENTIAL/PLATELET
Basophils Absolute: 0 10*3/uL (ref 0.0–0.1)
Eosinophils Relative: 3 % (ref 0–5)
Lymphocytes Relative: 38 % (ref 12–46)
Lymphs Abs: 2.4 10*3/uL (ref 0.7–4.0)
MCV: 87.7 fL (ref 78.0–100.0)
Neutrophils Relative %: 51 % (ref 43–77)
Platelets: 153 10*3/uL (ref 150–400)
RBC: 5.37 MIL/uL (ref 4.22–5.81)
RDW: 12.9 % (ref 11.5–15.5)
WBC: 6.2 10*3/uL (ref 4.0–10.5)

## 2012-05-27 LAB — CARDIAC PANEL(CRET KIN+CKTOT+MB+TROPI)
Relative Index: 2.3 (ref 0.0–2.5)
Total CK: 146 U/L (ref 7–232)
Troponin I: <0.3 ng/mL (ref <0.30)
Troponin I: <0.3 ng/mL (ref <0.30)

## 2012-05-27 LAB — GLUCOSE, CAPILLARY: Glucose-Capillary: 120 mg/dL — ABNORMAL HIGH (ref 70–99)

## 2012-05-27 LAB — TROPONIN I: Troponin I: <0.3 ng/mL (ref <0.30)

## 2012-05-27 MED ORDER — FAMOTIDINE 20 MG PO TABS
20.0000 mg | ORAL_TABLET | Freq: Every day | ORAL | Status: DC
  Administered 2012-05-27 – 2012-05-28 (×2): 20 mg via ORAL
  Filled 2012-05-27 (×2): qty 1

## 2012-05-27 MED ORDER — ACETAMINOPHEN 650 MG RE SUPP: 650.0000 mg | Freq: Four times a day (QID) | RECTAL | Status: DC | PRN

## 2012-05-27 MED ORDER — OMEGA-3-ACID ETHYL ESTERS 1 G PO CAPS
3.0000 g | ORAL_CAPSULE | Freq: Every day | ORAL | Status: DC
  Administered 2012-05-27 – 2012-05-28 (×2): 3 g via ORAL
  Filled 2012-05-27 (×2): qty 3

## 2012-05-27 MED ORDER — ALBUTEROL SULFATE (5 MG/ML) 0.5% IN NEBU: 2.5000 mg | INHALATION_SOLUTION | RESPIRATORY_TRACT | Status: DC | PRN

## 2012-05-27 MED ORDER — ALPRAZOLAM 1 MG PO TABS: 2.0000 mg | ORAL_TABLET | Freq: Every evening | ORAL | Status: DC | PRN

## 2012-05-27 MED ORDER — ADULT MULTIVITAMIN W/MINERALS CH
1.0000 | ORAL_TABLET | Freq: Every day | ORAL | Status: DC
  Administered 2012-05-27 – 2012-05-28 (×2): 1 via ORAL
  Filled 2012-05-27 (×2): qty 1

## 2012-05-27 MED ORDER — TERAZOSIN HCL 1 MG PO CAPS
1.0000 mg | ORAL_CAPSULE | Freq: Every day | ORAL | Status: DC
  Administered 2012-05-27: 1 mg via ORAL
  Filled 2012-05-27: qty 1

## 2012-05-27 MED ORDER — ALUM & MAG HYDROXIDE-SIMETH 200-200-20 MG/5ML PO SUSP: 30.0000 mL | Freq: Four times a day (QID) | ORAL | Status: DC | PRN

## 2012-05-27 MED ORDER — ASPIRIN EC 81 MG PO TBEC
81.0000 mg | DELAYED_RELEASE_TABLET | Freq: Every day | ORAL | Status: DC
  Administered 2012-05-28: 81 mg via ORAL
  Filled 2012-05-27: qty 1

## 2012-05-27 MED ORDER — OXYCODONE-ACETAMINOPHEN 5-325 MG PO TABS
1.0000 | ORAL_TABLET | ORAL | Status: DC | PRN
Start: 2012-05-27 — End: 2012-05-28
  Administered 2012-05-27: 2 via ORAL
  Filled 2012-05-27: qty 2

## 2012-05-27 MED ORDER — MORPHINE SULFATE 4 MG/ML IJ SOLN
4.0000 mg | INTRAMUSCULAR | Status: DC | PRN
  Administered 2012-05-27: 4 mg via INTRAVENOUS
  Filled 2012-05-27: qty 1

## 2012-05-27 MED ORDER — MORPHINE SULFATE 4 MG/ML IJ SOLN
4.0000 mg | Freq: Once | INTRAMUSCULAR | Status: AC
  Administered 2012-05-27: 4 mg via INTRAVENOUS

## 2012-05-27 MED ORDER — ENOXAPARIN SODIUM 40 MG/0.4ML ~~LOC~~ SOLN
40.0000 mg | SUBCUTANEOUS | Status: DC
  Administered 2012-05-27: 40 mg via SUBCUTANEOUS
  Filled 2012-05-27: qty 0.4

## 2012-05-27 MED ORDER — NITROGLYCERIN 0.4 MG SL SUBL: 0.4000 mg | SUBLINGUAL_TABLET | SUBLINGUAL | Status: DC | PRN

## 2012-05-27 MED ORDER — OXYCODONE-ACETAMINOPHEN 7.5-325 MG PO TABS: 1.0000 | ORAL_TABLET | ORAL | Status: DC | PRN

## 2012-05-27 MED ORDER — ONDANSETRON HCL 4 MG/2ML IJ SOLN
4.0000 mg | Freq: Once | INTRAMUSCULAR | Status: AC
  Administered 2012-05-27: 4 mg via INTRAVENOUS
  Filled 2012-05-27: qty 2

## 2012-05-27 MED ORDER — ASPIRIN 81 MG PO CHEW
324.0000 mg | CHEWABLE_TABLET | Freq: Once | ORAL | Status: AC
  Administered 2012-05-27: 324 mg via ORAL
  Filled 2012-05-27: qty 4

## 2012-05-27 MED ORDER — ONDANSETRON HCL 4 MG PO TABS: 4.0000 mg | ORAL_TABLET | Freq: Four times a day (QID) | ORAL | Status: DC | PRN

## 2012-05-27 MED ORDER — INSULIN ASPART 100 UNIT/ML ~~LOC~~ SOLN: 0.0000 [IU] | Freq: Three times a day (TID) | SUBCUTANEOUS | Status: DC

## 2012-05-27 MED ORDER — FUROSEMIDE 20 MG PO TABS
20.0000 mg | ORAL_TABLET | Freq: Every day | ORAL | Status: DC
  Administered 2012-05-28: 20 mg via ORAL
  Filled 2012-05-27: qty 1

## 2012-05-27 MED ORDER — CELECOXIB 100 MG PO CAPS: 100.0000 mg | ORAL_CAPSULE | ORAL | Status: DC | PRN

## 2012-05-27 MED ORDER — ONDANSETRON HCL 4 MG/2ML IJ SOLN: 4.0000 mg | Freq: Four times a day (QID) | INTRAMUSCULAR | Status: DC | PRN

## 2012-05-27 MED ORDER — MORPHINE SULFATE 4 MG/ML IJ SOLN
INTRAMUSCULAR | Status: AC
  Filled 2012-05-27: qty 1

## 2012-05-27 MED ORDER — ACETAMINOPHEN 325 MG PO TABS: 650.0000 mg | ORAL_TABLET | Freq: Four times a day (QID) | ORAL | Status: DC | PRN

## 2012-05-27 MED ORDER — NITROGLYCERIN 0.4 MG SL SUBL
0.4000 mg | SUBLINGUAL_TABLET | Freq: Once | SUBLINGUAL | Status: AC
  Administered 2012-05-27: 0.4 mg via SUBLINGUAL
  Filled 2012-05-27: qty 25

## 2012-05-27 MED ORDER — INSULIN ASPART 100 UNIT/ML ~~LOC~~ SOLN: 0.0000 [IU] | Freq: Every day | SUBCUTANEOUS | Status: DC

## 2012-05-27 MED ORDER — OXYCODONE HCL 5 MG PO TABS: 2.5000 mg | ORAL_TABLET | ORAL | Status: DC | PRN

## 2012-05-27 NOTE — ED Notes (Signed)
Second NTG SL given.

## 2012-05-27 NOTE — ED Notes (Signed)
MD at bedside. 

## 2012-05-27 NOTE — ED Notes (Signed)
Pt states that he is getting a HA

## 2012-05-27 NOTE — ED Notes (Signed)
CP x 3 days, states pain is getting worse, now with SOB, denies hx of CP, denies taking any ASA today

## 2012-05-27 NOTE — ED Provider Notes (Signed)
History     CSN: [id number]  Arrival date & time 05/27/12  1100   First MD Initiated Contact with Patient 05/27/12 1121      Chief Complaint  Patient presents with  . Chest Pain    (Consider location/radiation/quality/duration/timing/severity/associated sxs/prior treatment) HPI....anterior chest pain for 3 days.  pain is fleeting and occurs every 15 seconds.  No radiation. Not associated with exercise. Mild dyspnea. No diaphoresis or nausea. Pain is minimal to moderate.  Risk factors include diabetes, hypertension, family history (father died at 18 of an MI)  Past Medical History  Diagnosis Date  . Hypertension   . Arthritis   . Anxiety   . Sleep apnea   . Fibromyalgia   . Diabetes mellitus     Past Surgical History  Procedure Date  . Knee arthroscopy     right x2  . Uvulectomy   . Hernia repair     umbilical-[facility]  . Tonsillectomy   . Hemorrhoid surgery 02/06/2012    Procedure: HEMORRHOIDECTOMY;  Surgeon: Dalia Heading, MD;  Location: [facility];  Service: General;  Laterality: N/A;    Family History  Problem Relation Age of Onset  . Anesthesia problems Neg Hx   . Hypotension Neg Hx   . Malignant hyperthermia Neg Hx   . Pseudochol deficiency Neg Hx     History  Substance Use Topics  . Smoking status: Never Smoker   . Smokeless tobacco: Not on file  . Alcohol Use: No      Review of Systems  All other systems reviewed and are negative.    Allergies  Review of patient's allergies indicates no known allergies.  Home Medications   Current Outpatient Rx  Name Route Sig Dispense Refill  . ALPRAZOLAM 2 MG PO TABS Oral Take 2 mg by mouth at bedtime as needed. For sleep    . CELECOXIB 100 MG PO CAPS Oral Take 100 mg by mouth as needed. For foot pain    . OMEGA-3 FATTY ACIDS 1000 MG PO CAPS Oral Take 3 g by mouth daily.    . FUROSEMIDE 20 MG PO TABS Oral Take 20 mg by mouth.    . METFORMIN HCL 1000 MG PO TABS Oral Take 1,000 mg by mouth 2 (two) times daily.      . ADULT MULTIVITAMIN W/MINERALS CH Oral Take 1 tablet by mouth daily.    . OXYCODONE-ACETAMINOPHEN 7.5-325 MG PO TABS Oral Take 1-2 tablets by mouth every 4 (four) hours as needed for pain. 50 tablet 0  . TERAZOSIN HCL 1 MG PO CAPS Oral Take 1 mg by mouth at bedtime.      BP 124/72  Pulse 78  Temp 98.7 F (37.1 C) (Oral)  Resp 16  Ht 5\' 8"  (1.727 m)  Wt 260 lb (117.935 kg)  BMI 39.53 kg/m2  SpO2 97%  Physical Exam  Nursing note and vitals reviewed. Constitutional: He is oriented to person, place, and time. He appears well-developed and well-nourished.  HENT:  Head: Normocephalic and atraumatic.  Eyes: Conjunctivae and EOM are normal. Pupils are equal, round, and reactive to light.  Neck: Normal range of motion. Neck supple.  Cardiovascular: Normal rate and regular rhythm.   Pulmonary/Chest: Effort normal and breath sounds normal.  Abdominal: Soft. Bowel sounds are normal.  Musculoskeletal: Normal range of motion.  Neurological: He is alert and oriented to person, place, and time.  Skin: Skin is warm and dry.  Psychiatric: He has a normal mood and affect.  ED Course  Procedures (including critical care time)  Labs Reviewed  BASIC METABOLIC PANEL - Abnormal; Notable for the following:    Glucose, Bld 129 (*)     All other components within normal limits  CBC WITH DIFFERENTIAL  TROPONIN I  D-DIMER, QUANTITATIVE   Dg Chest Portable 1 View  05/27/2012  *RADIOLOGY REPORT*  Clinical Data: Chest pain.  Hypertension.  Diabetes.  PORTABLE CHEST - 1 VIEW  Comparison: None.  Findings: Normal sized heart.  Clear lungs with normal vascularity. Mild central peribronchial thickening.  Thoracic spine degenerative changes.  IMPRESSION: Mild bronchitic changes.  Original Report Authenticated By: Darrol Angel, M.D.     No diagnosis found.   Date: 05/27/2012  Rate: 66  Rhythm: normal sinus rhythm  QRS Axis: normal  Intervals: normal  ST/T Wave abnormalities: normal  Conduction  Disutrbances: none  Narrative Interpretation: unremarkable     MDM  Patient has cardiac risk factors. History is moderate. Nitroglycerin x3 given with minimal to moderate results. Aspirin started. Admit        Donnetta Hutching, MD 05/27/12 1334

## 2012-05-27 NOTE — H&P (Signed)
BARTOSZ LUGINBILL MRN: [id number] DOB/AGE: Mar 27, 1961 51 y.o. Primary Care Physician:HALM, Viviann Spare, MD Admit date: 05/27/2012 Chief Complaint: Chest pain HPI: The patient is a 51 year old man with a history significant for hypertension, fibromyalgia, and diabetes mellitus, who presents to the hospital today with a chief complaint of chest pain. His pain is located on the right. There is occasionally some radiation to his right neck. He rates the pain a 3/10 in intensity. It is associated with mild shortness of breath, occasionally pleuritic. It is not associated with diaphoresis, nausea, or dizziness. He denies heartburn, cough, fever, and chills. However, he does manual labor as a landscaper. He was recently trimming some trees The pain occurs at rest and with activity. It was intermittent for the first couple days, however, over the past 24 hours, the pain has been more constant. The pain is sometimes a pressure-like pain.  In the emergency department, he was noted to be afebrile and hemodynamically stable. Currently, in his hospital room, he is the same. His chest x-ray reveals chronic bronchitic changes. His EKG reveals normal sinus rhythm with a heart rate of 66 beats per minute. His initial cardiac enzymes are normal. His d-dimer is within normal limits. He is being admitted for further evaluation and management.  Past Medical History  Diagnosis Date  . Hypertension   . Arthritis   . Anxiety   . Fibromyalgia   . Diabetes mellitus   . Obstructive sleep apnea 05/27/2012  . Marijuana use     Past Surgical History  Procedure Date  . Knee arthroscopy     right x2  . Uvulectomy   . Hernia repair     umbilical-[facility]  . Tonsillectomy   . Hemorrhoid surgery 02/06/2012    Procedure: HEMORRHOIDECTOMY;  Surgeon: Dalia Heading, MD;  Location: [facility];  Service: General;  Laterality: N/A;    Prior to Admission medications   Medication Sig Start Date End Date Taking? Authorizing Provider  alprazolam  Prudy Feeler) 2 MG tablet Take 2 mg by mouth at bedtime as needed. For sleep   Yes Historical Provider, MD  celecoxib (CELEBREX) 100 MG capsule Take 100 mg by mouth as needed. For foot pain   Yes Historical Provider, MD  fish oil-omega-3 fatty acids 1000 MG capsule Take 3 g by mouth daily.   Yes Historical Provider, MD  furosemide (LASIX) 20 MG tablet Take 20 mg by mouth.   Yes Historical Provider, MD  metFORMIN (GLUCOPHAGE) 1000 MG tablet Take 1,000 mg by mouth 2 (two) times daily.   Yes Historical Provider, MD  Multiple Vitamin (MULITIVITAMIN WITH MINERALS) TABS Take 1 tablet by mouth daily.   Yes Historical Provider, MD  oxyCODONE-acetaminophen (PERCOCET) 7.5-325 MG per tablet Take 1-2 tablets by mouth every 4 (four) hours as needed for pain. 02/06/12 02/05/13 Yes Dalia Heading, MD  terazosin (HYTRIN) 1 MG capsule Take 1 mg by mouth at bedtime.   Yes Historical Provider, MD    Allergies: No Known Allergies  Family History  Problem Relation Age of Onset  . Anesthesia problems Neg Hx   . Hypotension Neg Hx   . Malignant hyperthermia Neg Hx   . Pseudochol deficiency Neg Hx   Family history: His father died of a heart attack at 57 years of age. His mother is 65 years of age and has a history of kidney cancer.    Social History: He is married. He has 2 children. He is employed as a Administrator. He denies tobacco and alcohol use.  He smokes marijuana on occasion. He denies any other illicit drug use.         ROS: Positive for fibromyalgia pain, feet swelling, and insomnia. Review of systems as above in history present illness. Otherwise, review of systems is negative.  PHYSICAL EXAM: Blood pressure 121/81, pulse 57, temperature 98.4 F (36.9 C), temperature source Oral, resp. rate 24, height 5\' 8"  (1.727 m), weight 120.4 kg (265 lb 6.9 oz), SpO2 96.00%.  General: Pleasant overweight 51 year old Caucasian man, sitting up in bed, in no acute distress. HEENT: Head is normocephalic,  nontraumatic. Pupils are equal, round, and reactive to light. Extraocular movements are intact. Oropharynx reveals moist mucous membranes. No posterior exudates or erythema. Neck: Obese, supple, no adenopathy, no thyromegaly. No exquisite tenderness over the paracervical muscles. Lungs: Clear to auscultation bilaterally. Heart: S1, S2, with no murmurs rubs or gallops. Chest wall reveals mild tenderness over the right pectoris major muscle. No erythema, edema, or rash. Abdomen: Positive bowel sounds, soft, mildly obese, nontender, nondistended. No hepatosplenomegaly. No masses palpated. Extremities: Trace of pedal edema bilaterally. Pedal pulses palpable. No acute hot red joints. Neurologic: He is alert and oriented x3. Cranial nerves II through XII are intact. Strength is 5 over 5 throughout. Sensation is grossly intact.  Basic Metabolic Panel:  Basename 05/27/12 1124  NA 138  K 4.3  CL 103  CO2 26  GLUCOSE 129*  BUN 10  CREATININE 0.96  CALCIUM 9.1  MG --  PHOS --   Liver Function Tests: No results found for this basename: AST:2,ALT:2,ALKPHOS:2,BILITOT:2,PROT:2,ALBUMIN:2 in the last 72 hours No results found for this basename: LIPASE:2,AMYLASE:2 in the last 72 hours No results found for this basename: AMMONIA:2 in the last 72 hours CBC:  Basename 05/27/12 1124  WBC 6.2  NEUTROABS 3.2  HGB 16.7  HCT 47.1  MCV 87.7  PLT 153   Cardiac Enzymes:  Basename 05/27/12 1323 05/27/12 1124  CKTOTAL 146 --  CKMB 3.5 --  CKMBINDEX -- --  TROPONINI <0.30 <0.30   BNP: No results found for this basename: PROBNP:3 in the last 72 hours D-Dimer:  Basename 05/27/12 1124  DDIMER 0.46   CBG: No results found for this basename: GLUCAP:6 in the last 72 hours Hemoglobin A1C: No results found for this basename: HGBA1C in the last 72 hours Fasting Lipid Panel: No results found for this basename: CHOL,HDL,LDLCALC,TRIG,CHOLHDL,LDLDIRECT in the last 72 hours Thyroid Function Tests: No  results found for this basename: TSH,T4TOTAL,FREET4,T3FREE,THYROIDAB in the last 72 hours Anemia Panel: No results found for this basename: VITAMINB12,FOLATE,FERRITIN,TIBC,IRON,RETICCTPCT in the last 72 hours Coagulation: No results found for this basename: LABPROT:2,INR:2 in the last 72 hours Urine Drug Screen: Drugs of Abuse  No results found for this basename: labopia, cocainscrnur, labbenz, amphetmu, thcu, labbarb    Alcohol Level: No results found for this basename: ETH:2 in the last 72 hours Urinalysis: No results found for this basename: COLORURINE:2,APPERANCEUR:2,LABSPEC:2,PHURINE:2,GLUCOSEU:2,HGBUR:2,BILIRUBINUR:2,KETONESUR:2,PROTEINUR:2,UROBILINOGEN:2,NITRITE:2,LEUKOCYTESUR:2 in the last 72 hours    No results found for this or any previous visit (from the past 240 hour(s)).   Results for orders placed during the hospital encounter of 05/27/12 (from the past 48 hour(s))  CBC WITH DIFFERENTIAL     Status: Normal   Collection Time   05/27/12 11:24 AM      Component Value Range Comment   WBC 6.2  4.0 - 10.5 K/uL    RBC 5.37  4.22 - 5.81 MIL/uL    Hemoglobin 16.7  13.0 - 17.0 g/dL    HCT 47.1  39.0 - 52.0 %    MCV 87.7  78.0 - 100.0 fL    MCH 31.1  26.0 - 34.0 pg    MCHC 35.5  30.0 - 36.0 g/dL    RDW 16.1  09.6 - 04.5 %    Platelets 153  150 - 400 K/uL    Neutrophils Relative 51  43 - 77 %    Neutro Abs 3.2  1.7 - 7.7 K/uL    Lymphocytes Relative 38  12 - 46 %    Lymphs Abs 2.4  0.7 - 4.0 K/uL    Monocytes Relative 7  3 - 12 %    Monocytes Absolute 0.4  0.1 - 1.0 K/uL    Eosinophils Relative 3  0 - 5 %    Eosinophils Absolute 0.2  0.0 - 0.7 K/uL    Basophils Relative 1  0 - 1 %    Basophils Absolute 0.0  0.0 - 0.1 K/uL   BASIC METABOLIC PANEL     Status: Abnormal   Collection Time   05/27/12 11:24 AM      Component Value Range Comment   Sodium 138  135 - 145 mEq/L    Potassium 4.3  3.5 - 5.1 mEq/L    Chloride 103  96 - 112 mEq/L    CO2 26  19 - 32 mEq/L    Glucose,  Bld 129 (*) 70 - 99 mg/dL    BUN 10  6 - 23 mg/dL    Creatinine, Ser 4.09  0.50 - 1.35 mg/dL    Calcium 9.1  8.4 - 81.1 mg/dL    GFR calc non Af Amer >90  >90 mL/min    GFR calc Af Amer >90  >90 mL/min   TROPONIN I     Status: Normal   Collection Time   05/27/12 11:24 AM      Component Value Range Comment   Troponin I <0.30  <0.30 ng/mL   D-DIMER, QUANTITATIVE     Status: Normal   Collection Time   05/27/12 11:24 AM      Component Value Range Comment   D-Dimer, Quant 0.46  0.00 - 0.48 ug/mL-FEU   CARDIAC PANEL(CRET KIN+CKTOT+MB+TROPI)     Status: Normal   Collection Time   05/27/12  1:23 PM      Component Value Range Comment   Total CK 146  7 - 232 U/L    CK, MB 3.5  0.3 - 4.0 ng/mL    Troponin I <0.30  <0.30 ng/mL    Relative Index 2.4  0.0 - 2.5     Dg Chest Portable 1 View  05/27/2012  *RADIOLOGY REPORT*  Clinical Data: Chest pain.  Hypertension.  Diabetes.  PORTABLE CHEST - 1 VIEW  Comparison: None.  Findings: Normal sized heart.  Clear lungs with normal vascularity. Mild central peribronchial thickening.  Thoracic spine degenerative changes.  IMPRESSION: Mild bronchitic changes.  Original Report Authenticated By: Darrol Angel, M.D.    Impression:  Principal Problem:  *Chest pain, precordial Active Problems:  Hypertension  Diabetes mellitus type 2 in obese  Obstructive sleep apnea  Marijuana use  Fibromyalgia  Chronic anxiety   This is a 51 year old man with history of hypertension, diabetes mellitus, fibromyalgia, and obstructive sleep apnea. His father died of a heart attack at 80 years of age. His chest pain appears somewhat atypical, however, because of his risk factors, it warrants further evaluation. His relatively normal EKG and cardiac enzymes are reassuring.  Plan: 1.  Will admit for observation. 2. Sublingual nitroglycerin as needed for chest pain. Otherwise, as needed morphine will be ordered for pain. 3. Continue his chronic medications. We'll add  prophylactic Pepcid. Was aspirin daily at 81 mg. 4. We'll hold metformin and treat his diabetes with sliding scale NovoLog. 5. For further evaluation, we'll order cardiac enzymes, lipase, liver transaminases, TSH, hemoglobin A1c, and fasting lipid profile. 6. We'll consult cardiology for possible cardiac stress test.      Lavella Myren 05/27/2012, 5:18 PM

## 2012-05-27 NOTE — Progress Notes (Signed)
Pt has own cpap machine to use tonight

## 2012-05-27 NOTE — ED Notes (Signed)
Third SL nTG given, EDP is aware of pt's pain returning

## 2012-05-27 NOTE — ED Notes (Signed)
Pt c/o mid center chest pain that is non radiating that started Saturday, pt states that the pain comes and goes, pain was associated with sob today, denies any n/v or diaphoresis. Pt states that the pain will come at rest.

## 2012-05-28 ENCOUNTER — Encounter (HOSPITAL_COMMUNITY): Payer: Self-pay | Admitting: Cardiology

## 2012-05-28 DIAGNOSIS — G4733 Obstructive sleep apnea (adult) (pediatric): Secondary | ICD-10-CM

## 2012-05-28 DIAGNOSIS — R079 Chest pain, unspecified: Secondary | ICD-10-CM

## 2012-05-28 DIAGNOSIS — E669 Obesity, unspecified: Secondary | ICD-10-CM | POA: Insufficient documentation

## 2012-05-28 DIAGNOSIS — E785 Hyperlipidemia, unspecified: Secondary | ICD-10-CM | POA: Diagnosis present

## 2012-05-28 HISTORY — DX: Hyperlipidemia, unspecified: E78.5

## 2012-05-28 LAB — COMPREHENSIVE METABOLIC PANEL
Alkaline Phosphatase: 48 U/L (ref 39–117)
BUN: 13 mg/dL (ref 6–23)
CO2: 27 mEq/L (ref 19–32)
Calcium: 8.9 mg/dL (ref 8.4–10.5)
GFR calc Af Amer: 85 mL/min — ABNORMAL LOW (ref >90)
GFR calc non Af Amer: 73 mL/min — ABNORMAL LOW (ref >90)
Glucose, Bld: 163 mg/dL — ABNORMAL HIGH (ref 70–99)
Total Protein: 6.2 g/dL (ref 6.0–8.3)

## 2012-05-28 LAB — CBC
HCT: 42.4 % (ref 39.0–52.0)
Hemoglobin: 15 g/dL (ref 13.0–17.0)
RDW: 12.7 % (ref 11.5–15.5)
WBC: 5.3 10*3/uL (ref 4.0–10.5)

## 2012-05-28 LAB — GLUCOSE, CAPILLARY: Glucose-Capillary: 115 mg/dL — ABNORMAL HIGH (ref 70–99)

## 2012-05-28 LAB — CARDIAC PANEL(CRET KIN+CKTOT+MB+TROPI)
CK, MB: 3.5 ng/mL (ref 0.3–4.0)
Relative Index: 2.5 (ref 0.0–2.5)
Relative Index: 2.7 — ABNORMAL HIGH (ref 0.0–2.5)
Total CK: 119 U/L (ref 7–232)
Troponin I: <0.3 ng/mL (ref <0.30)
Troponin I: <0.3 ng/mL (ref <0.30)

## 2012-05-28 LAB — LIPASE, BLOOD: Lipase: 20 U/L (ref 11–59)

## 2012-05-28 LAB — LIPID PANEL
Cholesterol: 156 mg/dL (ref 0–200)
HDL: 21 mg/dL — ABNORMAL LOW (ref >39)
Total CHOL/HDL Ratio: 7.4 RATIO

## 2012-05-28 MED ORDER — CYCLOBENZAPRINE HCL 10 MG PO TABS
5.0000 mg | ORAL_TABLET | Freq: Once | ORAL | Status: AC
  Administered 2012-05-28: 5 mg via ORAL
  Filled 2012-05-28: qty 1

## 2012-05-28 MED ORDER — FAMOTIDINE 20 MG PO TABS: 20.0000 mg | ORAL_TABLET | Freq: Every day | ORAL | Status: DC

## 2012-05-28 MED ORDER — CYCLOBENZAPRINE HCL 5 MG PO TABS: 5.0000 mg | ORAL_TABLET | Freq: Three times a day (TID) | ORAL | Status: AC | PRN

## 2012-05-28 NOTE — Progress Notes (Signed)
UR Chart Review Completed  

## 2012-05-28 NOTE — Consult Note (Signed)
Patient Name: Lance Frazier  MRN: [id number]  HPI: Lance Frazier is an 51 y.o. malereferred for consultation by Dr.Denise Sherrie Mustache, MD for chest pain.  Mr. Holley has enjoyed generally good health, but does have multiple cardiovascular risk factors including hypertension and diabetes. Has not previously been evaluated by a cardiologist nor has he undergone coronary angiography. He has no known vascular disease or cardiovascular disease. An echocardiogram performed in 2007 was normal except for mild LVH.  Lipids have been reportedly good in the past with no need for pharmacologic treatment.  Current symptoms began approximately 3 days ago when he noted intermittent localized lower right parasternal discomfort of moderate severity. Episodes were fairly fleeting initially, but did become more sustained. There was no relationship to activity or exertion, but there has been a modest respiratory component. He denies localized tenderness. 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  Past Medical History  Diagnosis Date  . Hypertension   . Arthritis   . Anxiety   . Fibromyalgia   . Diabetes mellitus   . Obstructive sleep apnea 1989    CPAP treatment since 1989; status post uvulectomy  . Marijuana use    Past Surgical History  Procedure Date  . Knee arthroscopy     right x2  . Uvulectomy   . Hernia repair     umbilical-[facility]  . Tonsillectomy   . Hemorrhoid surgery 02/06/2012    Procedure: HEMORRHOIDECTOMY;  Surgeon: Dalia Heading, MD;  Location: [facility];  Service: General;  Laterality: N/A;  . Colonoscopy 11/2009    Negative screening study   Family History  Problem Relation Age of Onset  . Anesthesia problems Neg Hx   . Hypotension Neg Hx   . Malignant hyperthermia Neg Hx   . Pseudochol deficiency Neg Hx   . Coronary artery disease Father 46    Acute MI   Social History:  reports that he has never smoked. He does not have any smokeless tobacco history on file. He  reports that he does not drink alcohol or use illicit drugs.  Allergies: No Known Allergies  Medications: I have reviewed the patient's current medications.  Results for orders placed during the hospital encounter of 05/27/12 (from the past 48 hour(s))  CBC WITH DIFFERENTIAL     Status: Normal   Collection Time   05/27/12 11:24 AM      Component Value Range Comment   WBC 6.2  4.0 - 10.5 K/uL    RBC 5.37  4.22 - 5.81 MIL/uL    Hemoglobin 16.7  13.0 - 17.0 g/dL    HCT 30.8  65.7 - 84.6 %    MCV 87.7  78.0 - 100.0 fL   TROPONIN I     Status: Normal   Collection Time   05/27/12 11:24 AM      Component Value Range Comment   Troponin I <0.30  <0.30 ng/mL   D-DIMER, QUANTITATIVE     Status: Normal   Collection Time   05/27/12 11:24 AM      Component Value Range Comment   D-Dimer, Quant 0.46  0.00 - 0.48 ug/mL-FEU   CARDIAC PANEL(CRET KIN+CKTOT+MB+TROPI)     Status: Normal   Collection Time   05/27/12  1:23 PM      Component Value Range Comment   Total CK 146  7 - 232 U/L    CK, MB 3.5  0.3 - 4.0 ng/mL    Troponin I <0.30  <0.30 ng/mL  Relative Index 2.4  0.0 - 2.5   HEMOGLOBIN A1C     Status: Abnormal   Collection Time   05/27/12  5:19 PM      Component Value Range Comment   Hemoglobin A1C 5.9 (*) <5.7 %    Mean Plasma Glucose 123 (*) <117 mg/dL   TSH     Status: Normal   Collection Time   05/27/12  5:19 PM      Component Value Range Comment   TSH 1.213  0.350 - 4.500 uIU/mL   CARDIAC PANEL(CRET KIN+CKTOT+MB+TROPI)     Status: Normal   Collection Time   05/27/12  5:24 PM      Component Value Range Comment   Total CK 145  7 - 232 U/L    CK, MB 3.3  0.3 - 4.0 ng/mL    Troponin I <0.30  <0.30 ng/mL     143 (*) 70 - 99 mg/dL   CARDIAC PANEL(CRET KIN+CKTOT+MB+TROPI)     Status: Normal   Collection Time   05/28/12  1:18 AM      Component Value Range Comment   Total CK 139  7 - 232 U/L    CK, MB 3.5  0.3 - 4.0 ng/mL    Troponin I <0.30  <0.30 ng/mL    Relative Index 2.5  0.0 - 2.5     LIPID PANEL     Status: Abnormal   Collection Time   05/28/12  4:48 AM      Component Value Range Comment   Cholesterol 156  0 - 200 mg/dL    Triglycerides 161 (*) <150 mg/dL    HDL 21 (*) >09 mg/dL    Total CHOL/HDL Ratio 7.4      VLDL 74 (*) 0 - 40 mg/dL    LDL Cholesterol 61  0 - 99 mg/dL   COMPREHENSIVE METABOLIC PANEL     Status: Abnormal   Collection Time   05/28/12  4:48 AM      Component Value Range Comment   Sodium 137  135 - 145 mEq/L    Potassium 3.5  3.5 - 5.1 mEq/L DELTA CHECK NOTED   Chloride 102  96 - 112 mEq/L    CO2 27  19 - 32 mEq/L    Glucose, Bld 163 (*) 70 - 99 mg/dL    BUN 13  6 - 23 mg/dL    Creatinine, Ser 6.04  0.50 - 1.35 mg/dL    Calcium 8.9  8.4 - 54.0 mg/dL    Total Protein 6.2  6.0 - 8.3 g/dL    Albumin 3.3 (*) 3.5 - 5.2 g/dL    AST 17  0 - 37 U/L    ALT 23  0 - 53 U/L    Alkaline Phosphatase 48  39 - 117 U/L    Total Bilirubin 0.3  0.3 - 1.2 mg/dL    GFR calc non Af Amer 73 (*) >90 mL/min    GFR calc Af Amer 85 (*) >90 mL/min   CBC     Status: Normal  LIPASE, BLOOD     Status: Normal   Dg Chest Portable 1 View  05/27/2012  *RADIOLOGY REPORT*  Clinical Data: Chest pain.  Hypertension.  Diabetes.  PORTABLE CHEST - 1 VIEW  Comparison: None.  Findings: Normal sized heart.  Clear lungs with normal vascularity. Mild central peribronchial thickening.  Thoracic spine degenerative changes.  IMPRESSION: Mild bronchitic changes.  Original Report Authenticated By: Darrol Angel, M.D.  Review of Systems: General: no anorexia,  weight fluctuates  Cardiac: no dyspnea, orthopnea, PND,  or syncope Respiratory: no cough, sputum production or hemoptysis GI: no nausea, abdominal pain, emesis, diarrhea or constipation Integument: no significant lesions Neurologic: No muscle weakness or paralysis; no speech disturbance; no headache; Chronic muscle pain   All other systems reviewed and are negative.  Physical Exam: Blood pressure 118/79, pulse 60, temperature  97.7 F (36.5 C), temperature source Oral, resp. rate 20, height 5\' 8"  (1.727 m), weight 120.4 kg (265 lb 6.9 oz), SpO2 91.00%. Body mass index is 40.36 kg/(m^2). General-Well-developed; no acute distress; overweight  HEENT-[place]/AT; PERRL; EOM intact; conjunctiva and lids nl Neck-No JVD; no carotid bruits Endocrine- slight  thyromegaly Lungs-Clear lung fields; resonant percussion; normal I-to-E ratio; decreased breath sounds at the bases  Cardiovascular- normal PMI;  distant  S1 and S2 Abdomen-BS normal;  firm and non-tender without masses or organomegaly Musculoskeletal-No deformities, cyanosis or clubbing Neurologic-Nl cranial nerves; symmetric strength and tone Skin- Warm, no significant lesions Extremities-Nl distal pulses; no edema  Assessment/Plan:  Patient Active Problem List   Diagnosis Date Noted  . Chest pain, precordial 05/27/2012  . Hypertension 05/27/2012  . Diabetes mellitus type 2 in obese 05/27/2012  . Obstructive sleep apnea 05/27/2012  . Marijuana use 05/27/2012  . Fibromyalgia 05/27/2012  . Chronic anxiety 05/27/2012  . KNEE PAIN, RIGHT 09/22/2007  . PLANTAR FASCIITIS, RIGHT 09/22/2007   Although patient's chest discomfort is atypical, there is adequate risk and concern to justify proceeding with stress testing.  Because myocardial infarction has been ruled out, and EKG is low risk, patient can be discharged to return within the next few days for stress testing.  George Mason Bing, MD 05/28/2012, 11:23 AM

## 2012-05-28 NOTE — Progress Notes (Signed)
D/c instructions reviewed with patient and wife.  Verbalized understanding.  Pt dc'd to home with wife.  Schonewitz, Candelaria Stagers 05/28/2012

## 2012-05-28 NOTE — Discharge Summary (Signed)
Physician Discharge Summary  RAFAN Frazier MRN: [id number] DOB/AGE: 04/06/1961 51 y.o.  PCP: Vivia Ewing, MD   Admit date: 05/27/2012 Discharge date: 05/28/2012  Discharge Diagnoses:  1. Atypical chest pain. Myocardial infarction ruled out. 2. Hyperlipidemia. The patient's fasting lipid profile revealed total cholesterol of 156, triglycerides of 369, HDL of 21, and LDL of 61. 3. Hypertension. Well controlled. 4. Type 2 diabetes mellitus. Well controlled. Hemoglobin A1c 5.9. 5. Obstructive sleep apnea, on CPAP. 6. Fibromyalgia. 7. Chronic anxiety. 8. Mild bradycardia.   Medication List  As of 05/28/2012 12:20 PM   TAKE these medications         alprazolam 2 MG tablet   Commonly known as: XANAX   Take 2 mg by mouth at bedtime as needed. For sleep      celecoxib 100 MG capsule   Commonly known as: CELEBREX   Take 100 mg by mouth as needed. For foot pain      cyclobenzaprine 5 MG tablet   Commonly known as: FLEXERIL   Take 1 tablet (5 mg total) by mouth 3 (three) times daily as needed for muscle spasms.      famotidine 20 MG tablet   Commonly known as: PEPCID   Take 1 tablet (20 mg total) by mouth daily.      fish oil-omega-3 fatty acids 1000 MG capsule   Take 3 g by mouth daily.      furosemide 20 MG tablet   Commonly known as: LASIX   Take 20 mg by mouth.      metFORMIN 1000 MG tablet   Commonly known as: GLUCOPHAGE   Take 1,000 mg by mouth 2 (two) times daily.      multivitamin with minerals Tabs   Take 1 tablet by mouth daily.      oxyCODONE-acetaminophen 7.5-325 MG per tablet   Commonly known as: PERCOCET   Take 1-2 tablets by mouth every 4 (four) hours as needed for pain.      terazosin 1 MG capsule   Commonly known as: HYTRIN   Take 1 mg by mouth at bedtime.            Discharge Condition: Improved.  Disposition: 01-Home or Self Care   Consults: [place] Bing, M.D.   Significant Diagnostic Studies: Dg Chest Portable 1  View  05/27/2012  *RADIOLOGY REPORT*  Clinical Data: Chest pain.  Hypertension.  Diabetes.  PORTABLE CHEST - 1 VIEW  Comparison: None.  Findings: Normal sized heart.  Clear lungs with normal vascularity. Mild central peribronchial thickening.  Thoracic spine degenerative changes.  IMPRESSION: Mild bronchitic changes.  Original Report Authenticated By: Darrol Angel, M.D.     Microbiology: No results found for this or any previous visit (from the past 240 hour(s)).   Labs: Results for orders placed during the hospital encounter of 05/27/12 (from the past 48 hour(s))  CBC WITH DIFFERENTIAL     Status: Normal   Collection Time   05/27/12 11:24 AM      Component Value Range Comment   WBC 6.2  4.0 - 10.5 K/uL    RBC 5.37  4.22 - 5.81 MIL/uL    Hemoglobin 16.7  13.0 - 17.0 g/dL    HCT 08.6  57.8 - 46.9 %    MCV 87.7  78.0 - 100.0 fL    MCH 31.1  26.0 - 34.0 pg    MCHC 35.5  30.0 - 36.0 g/dL    RDW 62.9  52.8 - 41.3 %  Platelets 153  150 - 400 K/uL    Neutrophils Relative 51  43 - 77 %    Neutro Abs 3.2  1.7 - 7.7 K/uL    Lymphocytes Relative 38  12 - 46 %    Lymphs Abs 2.4  0.7 - 4.0 K/uL    Monocytes Relative 7  3 - 12 %    Monocytes Absolute 0.4  0.1 - 1.0 K/uL    Eosinophils Relative 3  0 - 5 %    Eosinophils Absolute 0.2  0.0 - 0.7 K/uL    Basophils Relative 1  0 - 1 %    Basophils Absolute 0.0  0.0 - 0.1 K/uL   BASIC METABOLIC PANEL     Status: Abnormal   Collection Time   05/27/12 11:24 AM      Component Value Range Comment   Sodium 138  135 - 145 mEq/L    Potassium 4.3  3.5 - 5.1 mEq/L    Chloride 103  96 - 112 mEq/L    CO2 26  19 - 32 mEq/L    Glucose, Bld 129 (*) 70 - 99 mg/dL    BUN 10  6 - 23 mg/dL    Creatinine, Ser 1.61  0.50 - 1.35 mg/dL    Calcium 9.1  8.4 - 09.6 mg/dL    GFR calc non Af Amer >90  >90 mL/min    GFR calc Af Amer >90  >90 mL/min   TROPONIN I     Status: Normal   Collection Time   05/27/12 11:24 AM      Component Value Range Comment   Troponin I  <0.30  <0.30 ng/mL   D-DIMER, QUANTITATIVE     Status: Normal   Collection Time   05/27/12 11:24 AM      Component Value Range Comment   D-Dimer, Quant 0.46  0.00 - 0.48 ug/mL-FEU   CARDIAC PANEL(CRET KIN+CKTOT+MB+TROPI)     Status: Normal   Collection Time   05/27/12  1:23 PM      Component Value Range Comment   Total CK 146  7 - 232 U/L    CK, MB 3.5  0.3 - 4.0 ng/mL    Troponin I <0.30  <0.30 ng/mL    Relative Index 2.4  0.0 - 2.5   GLUCOSE, CAPILLARY     Status: Abnormal   Collection Time   05/27/12  5:18 PM      Component Value Range Comment   Glucose-Capillary 120 (*) 70 - 99 mg/dL    Comment 1 Notify RN      Comment 2 Documented in Chart     HEMOGLOBIN A1C     Status: Abnormal   Collection Time   05/27/12  5:19 PM      Component Value Range Comment   Hemoglobin A1C 5.9 (*) <5.7 %    Mean Plasma Glucose 123 (*) <117 mg/dL   TSH     Status: Normal   Collection Time   05/27/12  5:19 PM      Component Value Range Comment   TSH 1.213  0.350 - 4.500 uIU/mL   CARDIAC PANEL(CRET KIN+CKTOT+MB+TROPI)     Status: Normal   Collection Time   05/27/12  5:24 PM      Component Value Range Comment   Total CK 145  7 - 232 U/L    CK, MB 3.3  0.3 - 4.0 ng/mL    Troponin I <0.30  <0.30 ng/mL    Relative  Index 2.3  0.0 - 2.5   GLUCOSE, CAPILLARY     Status: Abnormal   Collection Time   05/27/12  9:15 PM      Component Value Range Comment   Glucose-Capillary 143 (*) 70 - 99 mg/dL   CARDIAC PANEL(CRET KIN+CKTOT+MB+TROPI)     Status: Normal   Collection Time   05/28/12  1:18 AM      Component Value Range Comment   Total CK 139  7 - 232 U/L    CK, MB 3.5  0.3 - 4.0 ng/mL    Troponin I <0.30  <0.30 ng/mL    Relative Index 2.5  0.0 - 2.5   LIPID PANEL     Status: Abnormal   Collection Time   05/28/12  4:48 AM      Component Value Range Comment   Cholesterol 156  0 - 200 mg/dL    Triglycerides 098 (*) <150 mg/dL    HDL 21 (*) >11 mg/dL    Total CHOL/HDL Ratio 7.4      VLDL 74 (*) 0 - 40 mg/dL     LDL Cholesterol 61  0 - 99 mg/dL   COMPREHENSIVE METABOLIC PANEL     Status: Abnormal   Collection Time   05/28/12  4:48 AM      Component Value Range Comment   Sodium 137  135 - 145 mEq/L    Potassium 3.5  3.5 - 5.1 mEq/L DELTA CHECK NOTED   Chloride 102  96 - 112 mEq/L    CO2 27  19 - 32 mEq/L    Glucose, Bld 163 (*) 70 - 99 mg/dL    BUN 13  6 - 23 mg/dL    Creatinine, Ser 9.14  0.50 - 1.35 mg/dL    Calcium 8.9  8.4 - 78.2 mg/dL    Total Protein 6.2  6.0 - 8.3 g/dL    Albumin 3.3 (*) 3.5 - 5.2 g/dL    AST 17  0 - 37 U/L    ALT 23  0 - 53 U/L    Alkaline Phosphatase 48  39 - 117 U/L    Total Bilirubin 0.3  0.3 - 1.2 mg/dL    GFR calc non Af Amer 73 (*) >90 mL/min    GFR calc Af Amer 85 (*) >90 mL/min   CBC     Status: Normal   Collection Time   05/28/12  4:48 AM      Component Value Range Comment   WBC 5.3  4.0 - 10.5 K/uL    RBC 4.81  4.22 - 5.81 MIL/uL    Hemoglobin 15.0  13.0 - 17.0 g/dL    HCT 95.6  21.3 - 08.6 %    MCV 88.1  78.0 - 100.0 fL    MCH 31.2  26.0 - 34.0 pg    MCHC 35.4  30.0 - 36.0 g/dL    RDW 57.8  46.9 - 62.9 %    Platelets 150  150 - 400 K/uL   LIPASE, BLOOD     Status: Normal   Collection Time   05/28/12  4:48 AM      Component Value Range Comment   Lipase 20  11 - 59 U/L   CARDIAC PANEL(CRET KIN+CKTOT+MB+TROPI)     Status: Abnormal   Collection Time   05/28/12  7:09 AM      Component Value Range Comment   Total CK 119  7 - 232 U/L    CK, MB 3.2  0.3 - 4.0 ng/mL    Troponin I <0.30  <0.30 ng/mL    Relative Index 2.7 (*) 0.0 - 2.5   GLUCOSE, CAPILLARY     Status: Abnormal   Collection Time   05/28/12  7:22 AM      Component Value Range Comment   Glucose-Capillary 109 (*) 70 - 99 mg/dL    Comment 1 Notify RN     GLUCOSE, CAPILLARY     Status: Abnormal   Collection Time   05/28/12 11:12 AM      Component Value Range Comment   Glucose-Capillary 115 (*) 70 - 99 mg/dL    Comment 1 Notify RN        HPI : The patient is a 51 year old man with a  history significant for hypertension, fibromyalgia, and diabetes mellitus, who presented to the emergency department on 05/27/2012 with a chief complaint of right-sided chest pain. In the emergency department, he was noted to be afebrile and hemodynamically stable. His chest x-ray revealed chronic bronchitic changes. His EKG revealed normal sinus rhythm with a heart rate of 66 beats per minute and no acute abnormalities. His initial cardiac enzymes were normal. His d-dimer was within normal limits. He was admitted for further evaluation and management.  HOSPITAL COURSE: The patient was apparently given 3 sublingual nitroglycerin tablets in the emergency department with little if any relief. Sublingual nitroglycerin was continued when necessary. He was started on antiplatelet therapy with aspirin. His pain was treated with as needed morphine. H2 blocker therapy was started with Pepcid. His other chronic medications were continued for treatment of fibromyalgia. Metformin was temporarily withheld, but his diabetes was treated with sliding scale NovoLog. For further evaluation, a number of studies were ordered. All of his cardiac enzymes were well within normal limits. The results of his fasting lipid profile were dictated above. He had been taking fish oil capsules on an inconsistent basis, in fact, he had stopped taking it for 2 months. Rather than starting him on another pharmacological agent for hyperlipidemia/hypertriglyceridemia, he was instructed to restart the fish oil capsules at 3 g daily and to take it more consistently. He voiced understanding. His TSH was within normal limits at 1.2. His hemoglobin A1c was 5.9. His followup EKG revealed sinus bradycardia with a heart rate of 57 beats per minute, but no ST or T wave abnormalities.  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 Therefore,  Flexeril was started as needed. It did give him some symptomatic relief.   Because of his strong family history of coronary artery disease, cardiologist, Dr. Dietrich Pates was consulted. He evaluated the patient. From his standpoint, the patient could be discharged to home, but with a followup outpatient cardiac stress test. He arranged for the stress test to be performed on 05/29/2012.  The patient remained hemodynamically stable and afebrile. He was discharged to home in improved and stable condition.    Discharge Exam: Blood pressure 118/79, pulse 60, temperature 97.7 F (36.5 C), temperature source Oral, resp. rate 20, height 5\' 8"  (1.727 m), weight 120.4 kg (265 lb 6.9 oz), SpO2 91.00%.  Lungs: Clear to auscultation bilaterally. Heart: S1, S2, with borderline bradycardia. Abdomen: Obese, positive bowel sounds, soft, nontender, nondistended. Extremities: No pedal edema. Neurologic: He is alert and oriented x3. Cranial nerves II through XII are intact.   Discharge Orders    Future Appointments: Provider: Department: Dept Phone:  Center:   05/29/2012 10:00 AM [facility] [phone]     Future Orders Please Complete By Expires   Diet - low sodium heart healthy      Diet Carb Modified      Increase activity slowly      Discharge instructions      Comments:   Take your fish oil capsules more consistently and daily. Follow a low-fat diet.      Follow-up Information    Follow up with [facility] on 05/29/2012. (10:00 am for Treadmill Stress Test)    Contact information:   [address] [place] [address] [phone]         Total discharge time: Greater than 35 minutes.   Signed: Bera Pinela 05/28/2012, 12:20 PM

## 2012-05-29 ENCOUNTER — Encounter (HOSPITAL_COMMUNITY): Payer: Self-pay | Admitting: Cardiology

## 2012-05-29 ENCOUNTER — Ambulatory Visit (INDEPENDENT_AMBULATORY_CARE_PROVIDER_SITE_OTHER): Payer: BC Managed Care – PPO | Admitting: Cardiology

## 2012-05-29 DIAGNOSIS — R072 Precordial pain: Secondary | ICD-10-CM

## 2012-05-29 NOTE — Progress Notes (Signed)
Stress Lab Nurses Notes - Lance Frazier  Lance Frazier 05/29/2012 Reason for doing test: Chest Pain Type of test: Regular GTX Nurse performing test: Parke Poisson, RN Nuclear Medicine Tech: Not Applicable Echo Tech: Not Applicable MD performing test: R. Rothbart Family MD: Halm Test explained and consent signed: yes IV started: No IV started Symptoms: none Treatment/Intervention: None Reason test stopped: reached target HR After recovery IV was: none Patient to return to Nuc. Med at : NA Patient discharged: Home Patient's Condition upon discharge was: stable Comments: During test peak BP 192/78 & HR 157.  Recovery BP 140/70 & HR 87.  No symptoms noted in recovery. [name] T  Graded Exercise Test-Interpretation      Graded exercise performed to a work load of 10.1 METs and a heart rate of 157, 93% of age-predicted maximum.  Exercise discontinued due to dyspnea and fatigue; no chest discomfort reported.  Blood pressure increased from a resting value of 145/80 to 190/80 in recovery, a normal response..  No arrhythmias noted.  EKG: normal sinus rhythm, low-voltage, otherwise normal. Stress EKG:  No significant change. Impression:  Negative graded exercise test revealing good exercise tolerance, no or arrhythmias and no evidence for myocardial ischemia.  Other findings as noted.  [name], M.D.

## 2012-09-09 ENCOUNTER — Other Ambulatory Visit (HOSPITAL_COMMUNITY): Payer: Self-pay | Admitting: Sports Medicine

## 2012-09-09 DIAGNOSIS — R52 Pain, unspecified: Secondary | ICD-10-CM

## 2012-09-10 ENCOUNTER — Ambulatory Visit (HOSPITAL_COMMUNITY)
Admission: RE | Admit: 2012-09-10 | Discharge: 2012-09-10 | Disposition: A | Discharge status: Home / Self Care | Payer: BC Managed Care – PPO | Source: Ambulatory Visit | Attending: Sports Medicine | Admitting: Sports Medicine

## 2012-09-10 ENCOUNTER — Other Ambulatory Visit (HOSPITAL_COMMUNITY): Payer: Self-pay | Admitting: Sports Medicine

## 2012-09-10 ENCOUNTER — Ambulatory Visit (HOSPITAL_COMMUNITY): Admission: RE | Admit: 2012-09-10 | Payer: BC Managed Care – PPO | Source: Ambulatory Visit

## 2012-09-10 DIAGNOSIS — M25519 Pain in unspecified shoulder: Secondary | ICD-10-CM | POA: Insufficient documentation

## 2012-09-10 DIAGNOSIS — M719 Bursopathy, unspecified: Secondary | ICD-10-CM | POA: Insufficient documentation

## 2012-09-10 DIAGNOSIS — M67919 Unspecified disorder of synovium and tendon, unspecified shoulder: Secondary | ICD-10-CM | POA: Insufficient documentation

## 2012-09-10 DIAGNOSIS — R52 Pain, unspecified: Secondary | ICD-10-CM

## 2012-09-15 ENCOUNTER — Other Ambulatory Visit (HOSPITAL_COMMUNITY): Payer: Self-pay | Admitting: Sports Medicine

## 2012-09-15 DIAGNOSIS — M545 Low back pain, unspecified: Secondary | ICD-10-CM

## 2012-09-17 ENCOUNTER — Ambulatory Visit (HOSPITAL_COMMUNITY)
Admission: RE | Admit: 2012-09-17 | Discharge: 2012-09-17 | Disposition: A | Discharge status: Home / Self Care | Payer: BC Managed Care – PPO | Source: Ambulatory Visit | Attending: Sports Medicine | Admitting: Sports Medicine

## 2012-09-17 DIAGNOSIS — R209 Unspecified disturbances of skin sensation: Secondary | ICD-10-CM | POA: Insufficient documentation

## 2012-09-17 DIAGNOSIS — M5137 Other intervertebral disc degeneration, lumbosacral region: Secondary | ICD-10-CM | POA: Insufficient documentation

## 2012-09-17 DIAGNOSIS — M545 Low back pain, unspecified: Secondary | ICD-10-CM | POA: Insufficient documentation

## 2012-09-17 DIAGNOSIS — M5126 Other intervertebral disc displacement, lumbar region: Secondary | ICD-10-CM | POA: Insufficient documentation

## 2012-09-17 DIAGNOSIS — M51379 Other intervertebral disc degeneration, lumbosacral region without mention of lumbar back pain or lower extremity pain: Secondary | ICD-10-CM | POA: Insufficient documentation

## 2013-07-15 ENCOUNTER — Encounter: Payer: Self-pay | Admitting: Family Medicine

## 2013-07-15 ENCOUNTER — Ambulatory Visit (INDEPENDENT_AMBULATORY_CARE_PROVIDER_SITE_OTHER): Payer: BC Managed Care – PPO | Admitting: Family Medicine

## 2013-07-15 VITALS — BP 118/84 | Temp 98.2°F | Wt 275.0 lb

## 2013-07-15 DIAGNOSIS — R7309 Other abnormal glucose: Secondary | ICD-10-CM

## 2013-07-15 DIAGNOSIS — H60399 Other infective otitis externa, unspecified ear: Secondary | ICD-10-CM

## 2013-07-15 DIAGNOSIS — R739 Hyperglycemia, unspecified: Secondary | ICD-10-CM

## 2013-07-15 DIAGNOSIS — H60392 Other infective otitis externa, left ear: Secondary | ICD-10-CM

## 2013-07-15 LAB — GLUCOSE, POCT (MANUAL RESULT ENTRY): POC Glucose: 175 mg/dl — AB (ref 70–99)

## 2013-07-15 MED ORDER — METFORMIN HCL 1000 MG PO TABS: 1000.0000 mg | ORAL_TABLET | Freq: Two times a day (BID) | ORAL | Status: DC

## 2013-07-15 MED ORDER — NEOMYCIN-POLYMYXIN-HC 1 % OT SOLN: 3.0000 [drp] | Freq: Four times a day (QID) | OTIC | Status: DC

## 2013-07-15 NOTE — Progress Notes (Signed)
CC - 1 concern over possibly having new onset diabetes 2 - left ear pain  HPI Diabetes concern - Several years ago pt was dxd with prediabetes and was on metformin. He lost some weight and was able to d/c the metformin a few years ago (he is unsure exactly when). Over the winter he has had back problems which have led hom to be more sedentary. As a result he has been eating more and gained 20-30 pounds this year. Over the past month he has noticed increased thirst and urination as well as just not feeling well. The past 3 days he has had a dull headache and occasional blurred vision. He checked his sugar and it was over 300.   Ear pain - left ear, for 2 days, worsneing, has not tried any meds, has not been swimming a lot nor has he had any FBs in his ear canal. No h/o ear problems. No associated sx.   ROS - pos - polyuria, polydipsia, left ear pain  FH - multiple family members pos for DM  PE -  BP 118/84  Temp(Src) 98.2 F (36.8 C) (Temporal)  Wt 275 lb (124.739 kg)  BMI 41.82 kg/m2  General Appearance:  Alert, cooperative, no distress, appears stated age  Head:  Normocephalic, without obvious abnormality, atraumatic  Eyes:  PERRL, conjunctiva/corneas clear, EOM's intact, fundi benign, both eyes  Ears:  Normal TM's and external ear canals on right side, left ear cnaal swollen and with exudate - consistant with otitis externa, no discharge, TM visible  Nose: Nares normal, septum midline, mucosa normal, no drainage or sinus tenderness  Throat: Lips, mucosa, and tongue normal but lips slightly dry; teeth and gums normal  Neck: Supple, symmetrical, trachea midline, no adenopathy, thyroid: not enlarged, symmetric, no tenderness/mass/nodules, no carotid bruit or JVD     Lungs:   Clear to auscultation bilaterally, respirations unlabored  Chest Wall:  No tenderness or deformity  Heart:  Regular rate and rhythm, S1, S2 normal, no murmur, rub or gallop  Abdomen:   Soft, non-tender, bowel sounds  active all four quadrants,  no masses, no organomegaly        Extremities: Extremities normal, atraumatic, no cyanosis or edema  Pulses: 2+ and symmetric  Skin: Skin color, texture, turgor normal, no rashes or lesions  Lymph nodes: Cervical, supraclavicular, and axillary nodes normal  Neurologic: Normal    A/P  1 - hyperglycemia - suspect DM, FSBG today 173, pt does not look unwell and exam reassuring. Will check labs including a1c. Restart metformin (suggested he start with 500 bid for a couple days and then increase to 1000 bid to minimize adverse effects). Record sugars 4x/day for next 2 days. Hydrate well. Discussed diet and have given handout on diet. Suggetsed he refrain from activities that will firther dehydrate him, such as yard work. Will see him back in 2 days. If worse before that time he will rtc or go to the ED.   2 - OE - cortisporin, recheck Friday on his hyperglycemia f/u

## 2013-07-15 NOTE — Patient Instructions (Addendum)

## 2013-07-16 LAB — CBC WITH DIFFERENTIAL/PLATELET
Eosinophils Absolute: 0.1 10*3/uL (ref 0.0–0.7)
Hemoglobin: 17.3 g/dL — ABNORMAL HIGH (ref 13.0–17.0)
Lymphocytes Relative: 27 % (ref 12–46)
Lymphs Abs: 3 10*3/uL (ref 0.7–4.0)
MCH: 29.9 pg (ref 26.0–34.0)
MCV: 86.4 fL (ref 78.0–100.0)
Monocytes Relative: 7 % (ref 3–12)
Neutrophils Relative %: 65 % (ref 43–77)
RBC: 5.79 MIL/uL (ref 4.22–5.81)
WBC: 10.8 10*3/uL — ABNORMAL HIGH (ref 4.0–10.5)

## 2013-07-16 LAB — COMPREHENSIVE METABOLIC PANEL
ALT: 34 U/L (ref 0–53)
Albumin: 4.4 g/dL (ref 3.5–5.2)
CO2: 26 mEq/L (ref 19–32)
Calcium: 9.3 mg/dL (ref 8.4–10.5)
Chloride: 98 mEq/L (ref 96–112)
Glucose, Bld: 199 mg/dL — ABNORMAL HIGH (ref 70–99)
Sodium: 133 mEq/L — ABNORMAL LOW (ref 135–145)
Total Bilirubin: 0.7 mg/dL (ref 0.3–1.2)
Total Protein: 7.3 g/dL (ref 6.0–8.3)

## 2013-07-16 LAB — HEMOGLOBIN A1C
Hgb A1c MFr Bld: 9.4 % — ABNORMAL HIGH (ref <5.7)
Mean Plasma Glucose: 223 mg/dL — ABNORMAL HIGH (ref <117)

## 2013-07-17 ENCOUNTER — Ambulatory Visit (INDEPENDENT_AMBULATORY_CARE_PROVIDER_SITE_OTHER): Payer: BC Managed Care – PPO | Admitting: Family Medicine

## 2013-07-17 VITALS — BP 124/78 | Wt 277.2 lb

## 2013-07-17 DIAGNOSIS — H6691 Otitis media, unspecified, right ear: Secondary | ICD-10-CM

## 2013-07-17 DIAGNOSIS — H6690 Otitis media, unspecified, unspecified ear: Secondary | ICD-10-CM

## 2013-07-17 DIAGNOSIS — E119 Type 2 diabetes mellitus without complications: Secondary | ICD-10-CM

## 2013-07-17 DIAGNOSIS — H60392 Other infective otitis externa, left ear: Secondary | ICD-10-CM

## 2013-07-17 DIAGNOSIS — H60399 Other infective otitis externa, unspecified ear: Secondary | ICD-10-CM

## 2013-07-17 MED ORDER — AMOXICILLIN 875 MG PO TABS: 875.0000 mg | ORAL_TABLET | Freq: Two times a day (BID) | ORAL | Status: AC

## 2013-07-17 MED ORDER — CIPROFLOXACIN-DEXAMETHASONE 0.3-0.1 % OT SUSP: 4.0000 [drp] | Freq: Two times a day (BID) | OTIC | Status: DC

## 2013-07-17 NOTE — Progress Notes (Signed)
Diabetes Follow-Up Visit Chief Complaint:  DM Chief Complaint  Patient presents with  . Follow-up     Exam Regularity:  nl Edema:  None  Polyuria:  mild  Polydipsia:  mild Polyphagia:  none  BMI:  Body mass index is 42.16 kg/(m^2).   Weight changes:  n/a General Appearance:  alert, oriented, no acute distress, well nourished and obese Mood/Affect:  normal  HPI:  Pt here for f/u from visit 3 days ago when he was feeling unwell and we were both concerned about DM. In the past he had been pre-DM but was able to dc meds as he lost weight. Today he feels much better. Blurred vision competely resolved. Headaches polyuria and polydipsia mostly resolved.   Low fat/carbohydrate diet?  Yes Nicotine Abuse?  No Medication Compliance?  Yes Exercise?  No Alcohol Abuse?  No  Home BG Monitoring:  Checking 2 times a day. Average:  185   High: 328  Low:  103   Lab Results  Component Value Date   HGBA1C 9.4* 07/15/2013    Lab Results  Component Value Date   MICROALBUR 0.94 07/15/2013    Lab Results  Component Value Date   CHOL 156 05/28/2012   HDL 21* 05/28/2012   LDLCALC 61 05/28/2012   TRIG 369* 05/28/2012   CHOLHDL 7.4 05/28/2012      Assessment: 1.  Diabetes.  We discussed that his a1c confirms DM. He will continue his metformin at 1000mg  bid. He has started his dietary modifications and will continue. I have referred him to DM ed and I think this will be helpful in the dietary area in particular.  2.  Blood Pressure.  At goal 3.  Lipids.  ldl pending, discussed he may need a statin. Have asked him to start 81mg  ec asa 4.  Foot Care.  No issues, discussed soft tissue and nails 5.  Dental Care.  Discussed, has not been for 1 year and he will schedule 6.  Eye Care/Exam.  Follows with optho, due to go in so he will schedule  Recommendations: 1.  Patient is counseled on appropriate foot care. 2.  BP goal < 130/80. 3.  LDL goal of < 100, HDL > 40 and TG < 150. 4.  Eye Exam yearly and  Dental Exam every 6 months. 5.  Dietary recommendations:  DM diet, discuss with DM ed, if further questions let us know.  6.  Physical Activity recommendations:  Pt says he is starting to get out walking. Encouraged this.   He needs a refill on his test strips but is not sure the brand. He will call.    Other problem today - b/l otalgia. Has been on cortisporin for 2 days for OE of left canal. Says it has not helped. PE reveals no change. Will change to ciprodex. Right TM looks infected. Starting on amox. If not clering up he will let us know.   7.  Medication recommendations at this time are as follows:  Cont metformin, start asa 8.  Return to clinic in 3 mos, earlier if needed   Time spent counseling patient:  25 min  Physician time spent with patient:  25 min

## 2013-07-17 NOTE — Patient Instructions (Addendum)
Start aspirin 81mg  per day Follow up with DM ed  Diabetes and Exercise Regular exercise is important and can help:   Control blood glucose (sugar).  Decrease blood pressure.    Control blood lipids (cholesterol, triglycerides).  Improve overall health. BENEFITS FROM EXERCISE  Improved fitness.  Improved flexibility.  Improved endurance.  Increased bone density.  Weight control.  Increased muscle strength.  Decreased body fat.  Improvement of the body's use of insulin, a hormone.  Increased insulin sensitivity.  Reduction of insulin needs.  Reduced stress and tension.  Helps you feel better. People with diabetes who add exercise to their lifestyle gain additional benefits, including:  Weight loss.  Reduced appetite.  Improvement of the body's use of blood glucose.  Decreased risk factors for heart disease:  Lowering of cholesterol and triglycerides.  Raising the level of good cholesterol (high-density lipoproteins, HDL).  Lowering blood sugar.  Decreased blood pressure. TYPE 1 DIABETES AND EXERCISE  Exercise will usually lower your blood glucose.  If blood glucose is greater than 240 mg/dl, check urine ketones. If ketones are present, do not exercise.  Location of the insulin injection sites may need to be adjusted with exercise. Avoid injecting insulin into areas of the body that will be exercised. For example, avoid injecting insulin into:  The arms when playing tennis.  The legs when jogging. For more information, discuss this with your caregiver.  Keep a record of:  Food intake.  Type and amount of exercise.  Expected peak times of insulin action.  Blood glucose levels. Do this before, during, and after exercise. Review your records with your caregiver. This will help you to develop guidelines for adjusting food intake and insulin amounts.  TYPE 2 DIABETES AND EXERCISE  Regular physical activity can help control blood  glucose.  Exercise is important because it may:  Increase the body's sensitivity to insulin.  Improve blood glucose control.  Exercise reduces the risk of heart disease. It decreases serum cholesterol and triglycerides. It also lowers blood pressure.  Those who take insulin or oral hypoglycemic agents should watch for signs of hypoglycemia. These signs include dizziness, shaking, sweating, chills, and confusion.  Body water is lost during exercise. It must be replaced. This will help to avoid loss of body fluids (dehydration) or heat stroke. Be sure to talk to your caregiver before starting an exercise program to make sure it is safe for you. Remember, any activity is better than none.  Document Released: 01/26/2004 Document Revised: 01/28/2012 Document Reviewed: 05/12/2009 [facility] Patient Information 2014 [place], [place].

## 2013-07-29 ENCOUNTER — Telehealth: Payer: Self-pay | Admitting: *Deleted

## 2013-07-29 ENCOUNTER — Other Ambulatory Visit: Payer: Self-pay | Admitting: Family Medicine

## 2013-07-29 DIAGNOSIS — E119 Type 2 diabetes mellitus without complications: Secondary | ICD-10-CM

## 2013-07-29 MED ORDER — GLUCOSE BLOOD VI STRP: ORAL_STRIP | Status: DC

## 2013-07-29 NOTE — Telephone Encounter (Signed)
Message copied by [facility], Bonnell Public on Wed Jul 29, 2013  2:47 PM ------      Message from: Acey Lav      Created: Wed Jul 29, 2013  2:19 PM       Great - please let pt know i have sent hin in strips. If he needs anything else for his monitoring, let me know. Thanks AW. ------

## 2013-07-29 NOTE — Telephone Encounter (Signed)
Pt called and left VM on nurse line stating that MD advised him to call and inform her of type of diabetic strips he uses. He stated that he uses contour strips. Will route to MD

## 2013-07-29 NOTE — Telephone Encounter (Signed)
Nurse called , no answer, message left for callback.  

## 2013-07-30 ENCOUNTER — Telehealth: Payer: Self-pay | Admitting: *Deleted

## 2013-07-30 NOTE — Telephone Encounter (Signed)
Pt notified and appreciative.

## 2013-07-30 NOTE — Telephone Encounter (Signed)
Message copied by [facility], Bonnell Public on Thu Jul 30, 2013 10:15 AM ------      Message from: Acey Lav      Created: Wed Jul 29, 2013  2:19 PM       Great - please let pt know i have sent hin in strips. If he needs anything else for his monitoring, let me know. Thanks AW. ------

## 2013-09-17 ENCOUNTER — Telehealth (HOSPITAL_COMMUNITY): Payer: Self-pay | Admitting: Dietician

## 2013-09-17 NOTE — Telephone Encounter (Signed)
Referral from [facility] entered on 07/17/13, however, just located in referral work que for dx: DM.

## 2013-09-17 NOTE — Telephone Encounter (Signed)
Noted pt was previous referred in 01/2011, however, pt did not respond to contact attempts to schedule appointment.

## 2013-09-17 NOTE — Telephone Encounter (Signed)
Called at 1037. Appointment scheduled for 09/23/13 at 1400.

## 2013-09-23 ENCOUNTER — Encounter (HOSPITAL_COMMUNITY): Payer: Self-pay | Admitting: Dietician

## 2013-09-23 NOTE — Progress Notes (Signed)
Outpatient Initial Nutrition Assessment  Date:09/23/2013   Appt Start Time: 1409  Referring Physician: TMPA Reason for Visit: diabetes  Nutrition Assessment:  Height: 5\' 7"  (170.2 cm)   Weight: 274 lb (124.286 kg)   IBW: 148#  %IBW: 147% UBW: 235#  %UBW: 117% Body mass index is 42.9 kg/(m^2).  Goal Weight: 247# (10% loss of current weight) Weight hx: Pt reports a 40# (17%) wt gain x 1 year due to inactivity as a result of back pain.   Estimated nutritional needs:  Kcals/ day: 2000-2100 Protein (grams)/day: 100-125 Fluid (L)/ day: 2.0-2.1  PMH:  Past Medical History  Diagnosis Date  . Hypertension   . Arthritis   . Anxiety   . Fibromyalgia   . Diabetes mellitus   . Obstructive sleep apnea 1989    CPAP treatment since 1989; status post uvulectomy  . Marijuana use   . Obesity     BMI of 40.4 at a weight of 265 pounds  . Hyperlipidemia 05/28/2012  . Chest pain, precordial 05/27/2012    Myocardial infarction ruled out in 05/2012; atypical chest discomfort, most likely of musculoskeletal origin.     Medications:  Current Outpatient Rx  Name  Route  Sig  Dispense  Refill  . celecoxib (CELEBREX) 100 MG capsule   Oral   Take 100 mg by mouth as needed. For foot pain         . ciprofloxacin-dexamethasone (CIPRODEX) otic suspension   Left Ear   Place 4 drops into the left ear 2 (two) times daily.   7.5 mL   0   . glucose blood (BAYER CONTOUR TEST) test strip      Use as instructed   100 each   12   . ketoprofen (ORUDIS) 75 MG capsule               . ketorolac (TORADOL) 10 MG tablet               . metFORMIN (GLUCOPHAGE) 1000 MG tablet   Oral   Take 1 tablet (1,000 mg total) by mouth 2 (two) times daily.   60 tablet   2   . NEOMYCIN-POLYMYXIN-HYDROCORTISONE (CORTISPORIN) 1 % SOLN otic solution   Left Ear   Place 3 drops into the left ear 4 (four) times daily.   10 mL   0     Labs: CMP     Component Value Date/Time   NA 133* 07/15/2013 1126   K 4.4  07/15/2013 1126   CL 98 07/15/2013 1126   CO2 26 07/15/2013 1126   GLUCOSE 199* 07/15/2013 1126   BUN 19 07/15/2013 1126   CREATININE 1.18 07/15/2013 1126   CREATININE 1.13 05/28/2012 0448   CALCIUM 9.3 07/15/2013 1126   PROT 7.3 07/15/2013 1126   ALBUMIN 4.4 07/15/2013 1126   AST 22 07/15/2013 1126   ALT 34 07/15/2013 1126   ALKPHOS 61 07/15/2013 1126   BILITOT 0.7 07/15/2013 1126   GFRNONAA 73* 05/28/2012 0448   GFRAA 85* 05/28/2012 0448    Lipid Panel     Component Value Date/Time   CHOL 156 05/28/2012 0448   TRIG 369* 05/28/2012 0448   HDL 21* 05/28/2012 0448   CHOLHDL 7.4 05/28/2012 0448   VLDL 74* 05/28/2012 0448   LDLCALC 61 05/28/2012 0448     Lab Results  Component Value Date   HGBA1C 9.4* 07/15/2013   HGBA1C 5.9* 05/27/2012   Lab Results  Component Value Date   MICROALBUR 0.94 07/15/2013  LDLCALC 61 05/28/2012   CREATININE 1.18 07/15/2013     Lifestyle/ social habits: Mr. Serfass is a very pleasant gentleman who resides in [place] with his wife and 3 daughters (one who is a Holiday representative at [employer] and twin girls who are seniors in [employer]). He works full time as a Administrator.  He reports that his stress level is a 5/10, citing back pain as his main source of stress. He reveals that he has had back issues for the last year, which as caused him to be very inactive due to the pain. He recently had a procedure done at the pain management clinic approximately one month ago, which he reveals has drastically improved his quality of life. He reports is is a struggle for him to get through his work day and he spends most of his time on the couch. Prior to his back issues, he reports he was very active, walking daily.  He reports that he was checking his CBGS daily, however, now only checks about 2 times per week. He generally checks immediately after breakfast and achieves readings in the 190's or higher.   Nutrition hx/habits: Records indicate that pt was referred to [facility] in 01/2011 for dx: prediabetes, however, pt did not follow through with appointment.  Mr. Sossamon was recently diagnosed with diabetes at his last office visit. Per MD notes, he has had a hx of prediabetes and was able to lose weight in the past. Pt reports that he was without a doctor for the past 8 months due to the departure of Dr. Milford Cage. He reports his weight loss efforts over the past 2 years have been inconsistent. He states he tried being vegan, which consisted of fruit and vegetables drinks made out of a juicer- he reports he lost 22 pounds in 17 days on this diet, but was unable to sustain it long term, due to the lack of variety. He also reports that he is "addicted" to food; he reveals her craves sweets and describes himself as a "grazer". Unfortunately, due to his back issues, he finds himself sitting in front of the TV and snacking. He also eats his meals in front of the TV.  Mr. Housey has made some positive changes in his diet, including decreasing the amount of time he eats out (down from 4 times a week to two times per week). Unfortunately, he complains of constant hunger and cravings. He eats several high calorie snacks (potato chips) throughout the day and drinks 3 cans of Coke daily.  He reports that he would like to take control of his diabetes and reduce dosages of medications, if possible. His goal is to treat diabetes with diet and exercise alone.   Diet recall: Breakfast (0900-1000): cheerios with 2% milk and can of Coke; Lunch (1300-1400): sandwich with wheat bread, [place], mayo, and cheese with potato chips, and Coke; Snack: can of Coke; Dinner: spaghetti OR pasta, Coke; Snack: chips.   Nutrition Diagnosis: Excessive carbohydrate intake r/t excessive snacking, undesireable food choices AEB Hgb A1c: 9.6.  Nutrition Intervention: Nutrition rx: 1500-1600 kcal NAS, diabetic diet; 3 meals per day (limit 1 starch per meal); low calorie beverages only; physical activity as  tolerated; low calorie beverages only  Education/Counseling Provided: Educated pt on principles of diabetic diet. Discussed carbohydrate metabolism in relation to diabetes. Educated pt on basic self-management principles including: signs and symptoms of hyperglycemia and hypoglycemia, goals for fasting and postprandial blood sugars, goals for Hgb A1c, importance  of checking feet, importance of keeping PCP appointments, and foot care. Educated pt on plate method, portion sizes, and sources of carbohydrate. Discussed importance of regular meal pattern. Discussed importance of adding sources of whole grains to diet to improve glycemic control. Also encouraged to choose low fat dairy, lean meats, and whole fruits and vegetables more often. Discussed options of artificial sweeteners and encouraged pt to use which brand she liked best. Discussed nutritional content of foods commonly eaten and discussed healthier alternatives. Discussed importance of compliance to prevent further complications of disease. Educated pt on importance of physical activity (goal of at least 30 minutes 5 times per week) along with a healthy diet to achieve weight loss and glycemic goals. Encouraged slow, moderate weight loss of 1-2# per week, or 7-10% of current body weight. Provided "Carb Counting and Meal Planning", "Diabetes and You", and "Your Guide To Better Office Visits" handouts. Used TeachBack to assess understanding.   Understanding, Motivation, Ability to Follow Recommendations: Expect fair compliance  Monitoring and Evaluation: Goals: 1) 1-2# weight loss per week; 2) Physical activity as tolerated; 3) Hgb A1c < 7.0  Recommendations: 1) For weight loss: 1500-1600 kcals daily; 2) Drink water, low calorie drink mixes, or flavored water instead of regular soda; 3) Be as physically active as possible; 4) Eat in an environment away from distractions; 5) Choose fruit, veggies, or yogurt as snacks instead of chips; 6) Check AM  fasting blood sugar or wait to test 1-2 hours after meals for a more accurate reading  F/U: PRN. Provided RD contact information.   Kairee Isa A. Mayford Knife, RD, LDN 09/23/2013  Appt EndTime: 1535

## 2013-10-19 ENCOUNTER — Encounter: Payer: Self-pay | Admitting: Family Medicine

## 2013-10-19 ENCOUNTER — Ambulatory Visit (INDEPENDENT_AMBULATORY_CARE_PROVIDER_SITE_OTHER): Payer: BC Managed Care – PPO | Admitting: Family Medicine

## 2013-10-19 VITALS — BP 126/74 | HR 89 | Temp 97.8°F | Resp 20 | Ht 64.2 in | Wt 269.5 lb

## 2013-10-19 DIAGNOSIS — H6692 Otitis media, unspecified, left ear: Secondary | ICD-10-CM

## 2013-10-19 DIAGNOSIS — H6690 Otitis media, unspecified, unspecified ear: Secondary | ICD-10-CM

## 2013-10-19 DIAGNOSIS — E119 Type 2 diabetes mellitus without complications: Secondary | ICD-10-CM

## 2013-10-19 MED ORDER — ASPIRIN EC 81 MG PO TBEC
81.0000 mg | DELAYED_RELEASE_TABLET | Freq: Every day | ORAL | Status: DC
Start: 2013-10-19 — End: 2014-08-30

## 2013-10-19 MED ORDER — AZITHROMYCIN 250 MG PO TABS: ORAL_TABLET | ORAL | Status: DC

## 2013-10-19 MED ORDER — GLIPIZIDE 5 MG PO TABS: ORAL_TABLET | ORAL | Status: DC

## 2013-10-19 MED ORDER — LISINOPRIL 2.5 MG PO TABS: 2.5000 mg | ORAL_TABLET | Freq: Every day | ORAL | Status: DC

## 2013-10-19 NOTE — Patient Instructions (Signed)

## 2013-10-19 NOTE — Progress Notes (Signed)
Diabetes Follow-Up Visit Chief Complaint:   Chief Complaint  Patient presents with  . Follow-up     Exam Regularity:  Normal Edema:  trace  Polyuria:  none  Polydipsia:  none Polyphagia:  none  BMI:  Body mass index is 45.95 kg/(m^2).   Weight changes:  Down 7# General Appearance:  alert, oriented, no acute distress and well nourished Mood/Affect:  normal  HPI:  Lance Frazier says he feels great. He had a procedure done to his back which has aleviated his chronic pain. He is starting PT soon and after that will be able to exercise. He has lost 7 pounds today.   He c/o a full feeling in his left ear. It hurt recently.   Low fat/carbohydrate diet?  Yes Nicotine Abuse?  No Medication Compliance?  Yes Exercise?  Yes - PT for back at this point Alcohol Abuse?  No  Home BG Monitoring:  Checking  Every other day. Average:  Fasting 150  High: 280  Low:  137 Postprandial averaging 180  Lab Results  Component Value Date   HGBA1C 9.4* 07/15/2013    Lab Results  Component Value Date   MICROALBUR 0.94 07/15/2013    Lab Results  Component Value Date   CHOL 156 05/28/2012   HDL 21* 05/28/2012   LDLCALC 61 05/28/2012   TRIG 369* 05/28/2012   CHOLHDL 7.4 05/28/2012    ROS - per hpi  PE Nursing note and vitals reviewed. Constitutional:  oriented to person, place, and time.  appears well-developed and well-nourished.  HENT:  Right Ear: External ear normal.  Left Ear: red and distorted LR Cardiovascular: Normal rate, regular rhythm and normal heart sounds.   Pulmonary/Chest: Effort normal and breath sounds normal.  Abdominal: Soft. Bowel sounds are normal. She exhibits no distension. There is no tenderness. There is no rebound.  Skin: Skin is warm and dry.  Psychiatric: She has a normal mood and affect. Her behavior is normal.  Feet - mildly ingrown left second nail, cut in half moon instead of straight, too short. No other abnormalities.   Assessment: 1.  Diabetes.  Improving, will  check a1c this visit 2.  Blood Pressure.  At goal, will start ace at low dose for cardiac protective 3.  Lipids.  Not on statin. Will start and check flp. 4.  Foot Care.  Epsom salt soaks for mildly ingrown nail. Checks feet daily.  5.  Dental Care.  Has not been, will go in next few mos 6.  Eye Care/Exam.  Is due, will see in next few mos  Recommendations: 1.  Patient is counseled on appropriate foot care. 2.  BP goal < 130/80. 3.  LDL goal of < 100, HDL > 40 and TG < 150. 4.  Eye Exam yearly and Dental Exam every 6 months. 5.  Dietary recommendations:  Per dietician, discussed today, pt comfortable with them and committed 6.  Physical Activity recommendations:  Increase as tolerated and when cleared by PT 7.  Medication recommendations at this time are as follows:  Start low dose asa and ace, get flp checked and will start statin based on results. Cont metformin and start glucotrol.  8.  Return to clinic in 3-4 months   Time spent counseling patient:   Physician time spent with patient:    Left OM - azithromycin

## 2013-11-24 ENCOUNTER — Other Ambulatory Visit: Payer: Self-pay | Admitting: Family Medicine

## 2013-12-21 ENCOUNTER — Other Ambulatory Visit: Payer: Self-pay | Admitting: Family Medicine

## 2014-01-15 LAB — LIPID PANEL
Cholesterol: 186 mg/dL (ref 0–200)
HDL: 28 mg/dL — ABNORMAL LOW (ref >39)
Total CHOL/HDL Ratio: 6.6 Ratio
Triglycerides: 466 mg/dL — ABNORMAL HIGH (ref <150)

## 2014-01-15 LAB — HEMOGLOBIN A1C
Hgb A1c MFr Bld: 6.8 % — ABNORMAL HIGH (ref <5.7)
Mean Plasma Glucose: 148 mg/dL — ABNORMAL HIGH (ref <117)

## 2014-01-18 ENCOUNTER — Telehealth: Payer: Self-pay | Admitting: *Deleted

## 2014-01-18 ENCOUNTER — Ambulatory Visit (INDEPENDENT_AMBULATORY_CARE_PROVIDER_SITE_OTHER): Payer: BC Managed Care – PPO | Admitting: Family Medicine

## 2014-01-18 ENCOUNTER — Encounter: Payer: Self-pay | Admitting: Family Medicine

## 2014-01-18 DIAGNOSIS — E119 Type 2 diabetes mellitus without complications: Secondary | ICD-10-CM

## 2014-01-18 DIAGNOSIS — Z0000 Encounter for general adult medical examination without abnormal findings: Secondary | ICD-10-CM

## 2014-01-18 DIAGNOSIS — Z23 Encounter for immunization: Secondary | ICD-10-CM

## 2014-01-18 MED ORDER — ORLISTAT 120 MG PO CAPS: 120.0000 mg | ORAL_CAPSULE | Freq: Three times a day (TID) | ORAL | Status: DC

## 2014-01-18 NOTE — Telephone Encounter (Signed)
Notified pt and he stated that he was fasting and that he has an appointment with MD in office today. Explained that MD would go over all results with him when he came in.

## 2014-01-18 NOTE — Patient Instructions (Signed)

## 2014-01-18 NOTE — Telephone Encounter (Signed)
Message copied by [facility], Louis Meckel on Mon Jan 18, 2014 11:50 AM ------      Message from: Doran Heater      Created: Mon Jan 18, 2014  8:20 AM       Please let pt know that a1c is 6.8 - very good for diabetic. Keep up the good work. Triglycerides high - was he fasting at the time of test? ------

## 2014-01-18 NOTE — Progress Notes (Signed)
   Subjective:    Patient ID: Lance Frazier, male    DOB: 26-Jan-1961, 53 y.o.   MRN: [id number]  HPI  Pt here for cpe and to discuss obestiy.   obestyiy - has been working on diet and exercise. Would like to try something else if it will not be a danger to his heart as he has heard weight loss drugs can be.   # Health maint - PSA and DRE- no FH, declines DRE, discussed pros and cons of psa, pt would like the test - c-scope/pert FH - 11/30/09 - due in 5 years, in 2016 - BP screen -  - cholesterol screen - due today - fasting glucose screen -  Diabetic - improved a1c from 9.4 - 6.8 last month - chlamydia screen (if male under age 24) - Hep C (born 61-1967) - due - tdap (once over age 35 in place of DT) - - due - flu - had per pt - D - due   Review of Systems A 12 point review of systems is negative except as per hpi.       Objective:   Physical Exam Nursing note and vitals reviewed. Constitutional: He is oriented to person, place, and time. He  appears well-developed and well-nourished. morbidly obese HENT:  Right Ear: External ear normal.  Left Ear: External ear normal.  Nose: Nose normal.  Mouth/Throat: Oropharynx is clear and moist. No oropharyngeal exudate.  Eyes: Conjunctivae are normal. Pupils are equal, round, and reactive to light.  Neck: Normal range of motion. Neck supple. No thyromegaly present.  Cardiovascular: Normal rate, regular rhythm and normal heart sounds.   Pulmonary/Chest: Effort normal and breath sounds normal.  Abdominal: Soft. Bowel sounds are normal.  no distension. There is no tenderness. There is no rebound.  Lymphadenopathy:    He has no cervical adenopathy.  Neurological: He is alert and oriented to person, place, and time. He has normal reflexes.  Skin: Skin is warm and dry.He has no concerning moles or skin lesions Psychiatric: He has a normal mood and affect. His behavior is normal.        Assessment & Plan:  Lance Frazier was seen  today for annual exam.  Diagnoses and associated orders for this visit:  Severe obesity (BMI >= 40) - LDL cholesterol, direct - Discontinue: orlistat (XENICAL) 120 MG capsule; Take 1 capsule (120 mg total) by mouth 3 (three) times daily with meals. Work up to this dose slowly over a couple weeks. - orlistat (XENICAL) 120 MG capsule; Take 1 capsule (120 mg total) by mouth 3 (three) times daily with meals. Work up to this dose slowly over a couple weeks.  Diabetes - LDL cholesterol, direct - Hepatic function panel  Health maintenance examination - LDL cholesterol, direct - PSA - Tdap vaccine greater than or equal to 7yo IM - Vit D  25 hydroxy (rtn osteoporosis monitoring) - Hepatitis C antibody  Orders as above. Discussed vitamin requrements with oprlistat. F/u as scheduled for DM, earlier if needed.

## 2014-01-19 LAB — HEPATIC FUNCTION PANEL
ALBUMIN: 4.3 g/dL (ref 3.5–5.2)
ALK PHOS: 42 U/L (ref 39–117)
ALT: 24 U/L (ref 0–53)
AST: 18 U/L (ref 0–37)
BILIRUBIN INDIRECT: 0.3 mg/dL (ref 0.2–1.2)
BILIRUBIN TOTAL: 0.4 mg/dL (ref 0.2–1.2)
Bilirubin, Direct: 0.1 mg/dL (ref 0.0–0.3)
Total Protein: 6.8 g/dL (ref 6.0–8.3)

## 2014-01-19 LAB — VITAMIN D 25 HYDROXY (VIT D DEFICIENCY, FRACTURES): Vit D, 25-Hydroxy: 41 ng/mL (ref 30–89)

## 2014-01-19 LAB — LDL CHOLESTEROL, DIRECT: Direct LDL: 111 mg/dL — ABNORMAL HIGH

## 2014-01-19 LAB — PSA: PSA: 2.48 ng/mL (ref <=4.00)

## 2014-01-19 LAB — HEPATITIS C ANTIBODY: HCV AB: NEGATIVE

## 2014-01-22 ENCOUNTER — Other Ambulatory Visit: Payer: Self-pay | Admitting: Family Medicine

## 2014-01-22 ENCOUNTER — Telehealth: Payer: Self-pay | Admitting: *Deleted

## 2014-01-22 NOTE — Telephone Encounter (Signed)
Pt notified and appreciative.

## 2014-01-22 NOTE — Telephone Encounter (Signed)
Message copied by [facility], Louis Meckel on Fri Jan 22, 2014 10:46 AM ------      Message from: Doran Heater      Created: Thu Jan 21, 2014  3:47 PM       Please let pt know labs are all wnl (except direct ldl which is near optimal). No concerns regarding labs. Thanks AW ------

## 2014-02-25 ENCOUNTER — Other Ambulatory Visit: Payer: Self-pay | Admitting: Physical Medicine and Rehabilitation

## 2014-02-25 DIAGNOSIS — M47817 Spondylosis without myelopathy or radiculopathy, lumbosacral region: Secondary | ICD-10-CM

## 2014-02-26 ENCOUNTER — Other Ambulatory Visit: Payer: Self-pay | Admitting: *Deleted

## 2014-02-26 DIAGNOSIS — E119 Type 2 diabetes mellitus without complications: Secondary | ICD-10-CM

## 2014-02-26 MED ORDER — METFORMIN HCL 1000 MG PO TABS: ORAL_TABLET | ORAL | Status: DC

## 2014-02-26 MED ORDER — GLIPIZIDE 5 MG PO TABS: ORAL_TABLET | ORAL | Status: DC

## 2014-03-01 ENCOUNTER — Ambulatory Visit (HOSPITAL_COMMUNITY): Payer: BC Managed Care – PPO

## 2014-04-20 ENCOUNTER — Ambulatory Visit: Payer: BC Managed Care – PPO | Admitting: Family Medicine

## 2014-06-21 ENCOUNTER — Ambulatory Visit (HOSPITAL_COMMUNITY)
Admission: RE | Admit: 2014-06-21 | Discharge: 2014-06-21 | Disposition: A | Discharge status: Home / Self Care | Payer: BC Managed Care – PPO | Source: Ambulatory Visit | Attending: Physical Medicine and Rehabilitation | Admitting: Physical Medicine and Rehabilitation

## 2014-06-21 DIAGNOSIS — M47817 Spondylosis without myelopathy or radiculopathy, lumbosacral region: Secondary | ICD-10-CM

## 2014-07-23 ENCOUNTER — Other Ambulatory Visit (HOSPITAL_COMMUNITY): Payer: Self-pay | Admitting: Neurosurgery

## 2014-08-10 ENCOUNTER — Encounter (HOSPITAL_COMMUNITY): Payer: Self-pay | Admitting: Pharmacy Technician

## 2014-08-10 ENCOUNTER — Encounter (HOSPITAL_COMMUNITY)
Admission: RE | Admit: 2014-08-10 | Discharge: 2014-08-10 | Disposition: A | Discharge status: Home / Self Care | Payer: BC Managed Care – PPO | Source: Ambulatory Visit | Attending: General Surgery | Admitting: General Surgery

## 2014-08-10 ENCOUNTER — Encounter (HOSPITAL_COMMUNITY): Payer: Self-pay

## 2014-08-10 DIAGNOSIS — K6289 Other specified diseases of anus and rectum: Secondary | ICD-10-CM | POA: Diagnosis not present

## 2014-08-10 DIAGNOSIS — I10 Essential (primary) hypertension: Secondary | ICD-10-CM | POA: Diagnosis not present

## 2014-08-10 DIAGNOSIS — K648 Other hemorrhoids: Secondary | ICD-10-CM | POA: Diagnosis not present

## 2014-08-10 DIAGNOSIS — E119 Type 2 diabetes mellitus without complications: Secondary | ICD-10-CM | POA: Diagnosis not present

## 2014-08-10 DIAGNOSIS — Z7982 Long term (current) use of aspirin: Secondary | ICD-10-CM | POA: Diagnosis not present

## 2014-08-10 DIAGNOSIS — K645 Perianal venous thrombosis: Secondary | ICD-10-CM | POA: Diagnosis present

## 2014-08-10 DIAGNOSIS — F411 Generalized anxiety disorder: Secondary | ICD-10-CM | POA: Diagnosis not present

## 2014-08-10 DIAGNOSIS — Z79899 Other long term (current) drug therapy: Secondary | ICD-10-CM | POA: Diagnosis not present

## 2014-08-10 LAB — CBC WITH DIFFERENTIAL/PLATELET
Basophils Absolute: 0 10*3/uL (ref 0.0–0.1)
Basophils Relative: 0 % (ref 0–1)
EOS PCT: 1 % (ref 0–5)
Eosinophils Absolute: 0.1 10*3/uL (ref 0.0–0.7)
HCT: 47.9 % (ref 39.0–52.0)
Hemoglobin: 17.3 g/dL — ABNORMAL HIGH (ref 13.0–17.0)
LYMPHS ABS: 2.7 10*3/uL (ref 0.7–4.0)
Lymphocytes Relative: 31 % (ref 12–46)
MCH: 31 pg (ref 26.0–34.0)
MCHC: 36.1 g/dL — ABNORMAL HIGH (ref 30.0–36.0)
MCV: 85.8 fL (ref 78.0–100.0)
Monocytes Absolute: 0.6 10*3/uL (ref 0.1–1.0)
Monocytes Relative: 7 % (ref 3–12)
NEUTROS PCT: 61 % (ref 43–77)
Neutro Abs: 5.2 10*3/uL (ref 1.7–7.7)
PLATELETS: 214 10*3/uL (ref 150–400)
RBC: 5.58 MIL/uL (ref 4.22–5.81)
RDW: 13 % (ref 11.5–15.5)
WBC: 8.8 10*3/uL (ref 4.0–10.5)

## 2014-08-10 LAB — BASIC METABOLIC PANEL
Anion gap: 13 (ref 5–15)
BUN: 10 mg/dL (ref 6–23)
CO2: 28 mEq/L (ref 19–32)
Calcium: 9.5 mg/dL (ref 8.4–10.5)
Chloride: 96 mEq/L (ref 96–112)
Creatinine, Ser: 1.15 mg/dL (ref 0.50–1.35)
GFR calc Af Amer: 82 mL/min — ABNORMAL LOW (ref >90)
GFR, EST NON AFRICAN AMERICAN: 70 mL/min — AB (ref >90)
Glucose, Bld: 333 mg/dL — ABNORMAL HIGH (ref 70–99)
Potassium: 4.2 mEq/L (ref 3.7–5.3)
Sodium: 137 mEq/L (ref 137–147)

## 2014-08-10 NOTE — Patient Instructions (Addendum)
Lance Frazier  08/10/2014   Your procedure is scheduled on:  08/13/2014  Report to [facility] at *8:30 AM.  Call this number if you have problems the morning of surgery: [phone]   Remember:   Do not eat food or drink liquids after midnight.   Take these medicines the morning of surgery with A SIP OF WATER: Lisinopril   Do not wear jewelry, make-up or nail polish.  Do not wear lotions, powders, or perfumes. You may wear deodorant.  Do not shave 48 hours prior to surgery. Men may shave face and neck.  Do not bring valuables to the hospital.  [facility] is not responsible                  for any belongings or valuables.               Contacts, dentures or bridgework may not be worn into surgery.  Leave suitcase in the car. After surgery it may be brought to your room.  For patients admitted to the hospital, discharge time is determined by your                treatment team.               Patients discharged the day of surgery will not be allowed to drive  home.  Name and phone number of your driver: Lance Frazier  Special Instructions: Shower using CHG 2 nights before surgery and the night before surgery.  If you shower the day of surgery use CHG.  Use special wash - you have one bottle of CHG for all showers.  You should use approximately 1/3 of the bottle for each shower.   Please read over the following fact sheets that you were given: Care and Recovery After Surgery

## 2014-08-10 NOTE — H&P (Signed)
  NTS SOAP Note  Vital Signs:  Vitals as of: 12/31/2480: Systolic 500: Diastolic 95: Heart Rate 90: Temp 96.42F: Height 30ft 5in: Weight 276Lbs 0 Ounces: Pain Level 9: BMI 45.93  BMI : 45.93 kg/m2  Subjective: This 53 Years old Male presents for of painful hemorrhoid.  Just started hurting several days ago.  No blood per rectum.  Review of Symptoms:  Constitutional:unremarkable   Head:unremarkable    Eyes:unremarkable   Nose/Mouth/Throat:unremarkable Cardiovascular:  unremarkable   Respiratory:unremarkable   Gastrointestinal:  unremarkable   Genitourinary:unremarkable     Musculoskeletal:unremarkable   Skin:unremarkable Hematolgic/Lymphatic:unremarkable     Allergic/Immunologic:unremarkable     Past Medical History:    Reviewed  Past Medical History  Surgical History: umbilical herniorrhaphy Medical Problems:  Diabetes, High Blood pressure Allergies: nkda Medications: metformin, alprazolam, celebrex, terazosin, celebrex, furosemide, vitamins, fish oil   Social History:Reviewed  Social History  Preferred Language: English ([place]) Race:  White Ethnicity: Not Hispanic / Latino Age: 15 Years 3 Months Marital Status:  M Alcohol:  No Recreational drug(s):  No   Smoking Status: Never smoker reviewed on 05/27/2014 Functional Status reviewed on 05/27/2014 ------------------------------------------------ Bathing: Normal Cooking: Normal Dressing: Normal Driving: Normal Eating: Normal Managing Meds: Normal Oral Care: Normal Shopping: Normal Toileting: Normal Transferring: Normal Walking: Normal Cognitive Status reviewed on 05/27/2014 ------------------------------------------------ Attention: Normal Decision Making: Normal Language: Normal Memory: Normal Motor: Normal Perception: Normal Problem Solving: Normal Visual and Spatial: Normal   Family History:  Reviewed  Family Health History Mother,  Healthy;  Father, Healthy;     Objective Information: General:  Well appearing, well nourished in no distress. Heart:  RRR, no murmur Lungs:    CTA bilaterally, no wheezes, rhonchi, rales.  Breathing unlabored.   Small thrombosed external hemorrhoid at the 5 o'clock position.  Exam limited secondary to pain.  Assessment:Thrombosed external hemorrhoid  Diagnoses: 455.4 Thrombosed external hemorrhoids (Perianal venous thrombosis)  Procedures: 99213 - OFFICE OUTPATIENT VISIT 15 MINUTES    Plan:Scheduled for hemorrhoidectomy on 08/13/14.

## 2014-08-13 ENCOUNTER — Encounter (HOSPITAL_COMMUNITY): Payer: BC Managed Care – PPO | Admitting: Anesthesiology

## 2014-08-13 ENCOUNTER — Ambulatory Visit (HOSPITAL_COMMUNITY): Payer: BC Managed Care – PPO | Admitting: Anesthesiology

## 2014-08-13 ENCOUNTER — Encounter (HOSPITAL_COMMUNITY): Payer: Self-pay | Admitting: *Deleted

## 2014-08-13 ENCOUNTER — Encounter (HOSPITAL_COMMUNITY)
Admission: RE | Disposition: A | Discharge status: Home / Self Care | Payer: Self-pay | Source: Ambulatory Visit | Attending: General Surgery

## 2014-08-13 ENCOUNTER — Ambulatory Visit (HOSPITAL_COMMUNITY)
Admission: RE | Admit: 2014-08-13 | Discharge: 2014-08-13 | Disposition: A | Discharge status: Home / Self Care | Payer: BC Managed Care – PPO | Source: Ambulatory Visit | Attending: General Surgery | Admitting: General Surgery

## 2014-08-13 DIAGNOSIS — K6289 Other specified diseases of anus and rectum: Secondary | ICD-10-CM | POA: Insufficient documentation

## 2014-08-13 DIAGNOSIS — Z79899 Other long term (current) drug therapy: Secondary | ICD-10-CM | POA: Insufficient documentation

## 2014-08-13 DIAGNOSIS — K645 Perianal venous thrombosis: Secondary | ICD-10-CM | POA: Insufficient documentation

## 2014-08-13 DIAGNOSIS — E119 Type 2 diabetes mellitus without complications: Secondary | ICD-10-CM | POA: Insufficient documentation

## 2014-08-13 DIAGNOSIS — F411 Generalized anxiety disorder: Secondary | ICD-10-CM | POA: Insufficient documentation

## 2014-08-13 DIAGNOSIS — I10 Essential (primary) hypertension: Secondary | ICD-10-CM | POA: Insufficient documentation

## 2014-08-13 DIAGNOSIS — Z7982 Long term (current) use of aspirin: Secondary | ICD-10-CM | POA: Insufficient documentation

## 2014-08-13 DIAGNOSIS — K648 Other hemorrhoids: Secondary | ICD-10-CM | POA: Insufficient documentation

## 2014-08-13 HISTORY — PX: HEMORRHOID SURGERY: SHX153

## 2014-08-13 LAB — GLUCOSE, CAPILLARY
GLUCOSE-CAPILLARY: 213 mg/dL — AB (ref 70–99)
Glucose-Capillary: 207 mg/dL — ABNORMAL HIGH (ref 70–99)

## 2014-08-13 SURGERY — HEMORRHOIDECTOMY
Anesthesia: General | Site: Buttocks

## 2014-08-13 MED ORDER — LIDOCAINE VISCOUS 2 % MT SOLN
OROMUCOSAL | Status: DC | PRN
  Administered 2014-08-13: 20 mL via OROMUCOSAL

## 2014-08-13 MED ORDER — METRONIDAZOLE IN NACL 5-0.79 MG/ML-% IV SOLN
INTRAVENOUS | Status: AC
  Filled 2014-08-13: qty 100

## 2014-08-13 MED ORDER — FENTANYL CITRATE 0.05 MG/ML IJ SOLN
INTRAMUSCULAR | Status: AC
  Filled 2014-08-13: qty 2

## 2014-08-13 MED ORDER — ONDANSETRON HCL 4 MG/2ML IJ SOLN
4.0000 mg | Freq: Once | INTRAMUSCULAR | Status: AC
  Administered 2014-08-13: 4 mg via INTRAVENOUS

## 2014-08-13 MED ORDER — METRONIDAZOLE IN NACL 5-0.79 MG/ML-% IV SOLN: 500.0000 mg | INTRAVENOUS | Status: DC

## 2014-08-13 MED ORDER — BUPIVACAINE HCL (PF) 0.5 % IJ SOLN
INTRAMUSCULAR | Status: DC | PRN
  Administered 2014-08-13: 10 mL

## 2014-08-13 MED ORDER — FENTANYL CITRATE 0.05 MG/ML IJ SOLN: 25.0000 ug | INTRAMUSCULAR | Status: DC | PRN

## 2014-08-13 MED ORDER — BUPIVACAINE HCL (PF) 0.5 % IJ SOLN
INTRAMUSCULAR | Status: AC
  Filled 2014-08-13: qty 30

## 2014-08-13 MED ORDER — ONDANSETRON HCL 4 MG/2ML IJ SOLN
4.0000 mg | Freq: Once | INTRAMUSCULAR | Status: DC | PRN
Start: 2014-08-13 — End: 2014-08-13

## 2014-08-13 MED ORDER — MIDAZOLAM HCL 2 MG/2ML IJ SOLN
1.0000 mg | INTRAMUSCULAR | Status: DC | PRN
  Administered 2014-08-13: 2 mg via INTRAVENOUS

## 2014-08-13 MED ORDER — FENTANYL CITRATE 0.05 MG/ML IJ SOLN
INTRAMUSCULAR | Status: DC | PRN
  Administered 2014-08-13 (×5): 50 ug via INTRAVENOUS

## 2014-08-13 MED ORDER — ONDANSETRON HCL 4 MG/2ML IJ SOLN
INTRAMUSCULAR | Status: AC
  Filled 2014-08-13: qty 2

## 2014-08-13 MED ORDER — OXYCODONE-ACETAMINOPHEN 7.5-325 MG PO TABS: 1.0000 | ORAL_TABLET | ORAL | Status: DC | PRN

## 2014-08-13 MED ORDER — KETOROLAC TROMETHAMINE 30 MG/ML IJ SOLN
30.0000 mg | Freq: Once | INTRAMUSCULAR | Status: AC
  Administered 2014-08-13: 30 mg via INTRAVENOUS
  Filled 2014-08-13: qty 1

## 2014-08-13 MED ORDER — MIDAZOLAM HCL 2 MG/2ML IJ SOLN
INTRAMUSCULAR | Status: AC
  Filled 2014-08-13: qty 2

## 2014-08-13 MED ORDER — LIDOCAINE HCL 1 % IJ SOLN
INTRAMUSCULAR | Status: DC | PRN
  Administered 2014-08-13: 40 mg via INTRADERMAL

## 2014-08-13 MED ORDER — GLYCOPYRROLATE 0.2 MG/ML IJ SOLN
INTRAMUSCULAR | Status: AC
  Filled 2014-08-13: qty 1

## 2014-08-13 MED ORDER — LACTATED RINGERS IV SOLN
INTRAVENOUS | Status: DC
  Administered 2014-08-13 (×2): via INTRAVENOUS

## 2014-08-13 MED ORDER — FENTANYL CITRATE 0.05 MG/ML IJ SOLN
25.0000 ug | INTRAMUSCULAR | Status: AC
  Administered 2014-08-13: 25 ug via INTRAVENOUS

## 2014-08-13 MED ORDER — GLYCOPYRROLATE 0.2 MG/ML IJ SOLN
0.2000 mg | Freq: Once | INTRAMUSCULAR | Status: AC
  Administered 2014-08-13: 0.2 mg via INTRAVENOUS

## 2014-08-13 MED ORDER — PROPOFOL 10 MG/ML IV BOLUS
INTRAVENOUS | Status: DC | PRN
  Administered 2014-08-13: 200 mg via INTRAVENOUS

## 2014-08-13 MED ORDER — LIDOCAINE VISCOUS 2 % MT SOLN
OROMUCOSAL | Status: AC
  Filled 2014-08-13: qty 15

## 2014-08-13 MED ORDER — SODIUM CHLORIDE 0.9 % IR SOLN
Status: DC | PRN
  Administered 2014-08-13: 300 mL

## 2014-08-13 SURGICAL SUPPLY — 33 items
BAG HAMPER (MISCELLANEOUS) ×2 IMPLANT
CLOTH BEACON ORANGE TIMEOUT ST (SAFETY) ×2 IMPLANT
COVER LIGHT HANDLE STERIS (MISCELLANEOUS) ×4 IMPLANT
COVER MAYO STAND XLG (DRAPE) ×2 IMPLANT
DECANTER SPIKE VIAL GLASS SM (MISCELLANEOUS) ×2 IMPLANT
DRAPE PROXIMA HALF (DRAPES) ×2 IMPLANT
ELECT REM PT RETURN 9FT ADLT (ELECTROSURGICAL) ×2
ELECTRODE REM PT RTRN 9FT ADLT (ELECTROSURGICAL) ×1 IMPLANT
FORMALIN 10 PREFIL 120ML (MISCELLANEOUS) ×2 IMPLANT
GAUZE SPONGE 4X4 12PLY STRL (GAUZE/BANDAGES/DRESSINGS) ×2 IMPLANT
GLOVE BIOGEL M 7.0 STRL (GLOVE) ×1 IMPLANT
GLOVE EXAM NITRILE MD LF STRL (GLOVE) ×1 IMPLANT
GLOVE INDICATOR 6.0 STRL GRN (GLOVE) ×1 IMPLANT
GLOVE SURG SS PI 7.5 STRL IVOR (GLOVE) ×4 IMPLANT
GOWN STRL REUS W/ TWL XL LVL3 (GOWN DISPOSABLE) ×1 IMPLANT
GOWN STRL REUS W/TWL LRG LVL3 (GOWN DISPOSABLE) ×2 IMPLANT
GOWN STRL REUS W/TWL XL LVL3 (GOWN DISPOSABLE) ×2
HEMOSTAT SURGICEL 4X8 (HEMOSTASIS) ×2 IMPLANT
KIT ROOM TURNOVER AP CYSTO (KITS) ×2 IMPLANT
LIGASURE IMPACT 36 18CM CVD LR (INSTRUMENTS) IMPLANT
MANIFOLD NEPTUNE II (INSTRUMENTS) ×2 IMPLANT
NDL HYPO 25X1 1.5 SAFETY (NEEDLE) ×1 IMPLANT
NEEDLE HYPO 25X1 1.5 SAFETY (NEEDLE) ×2 IMPLANT
NS IRRIG 1000ML POUR BTL (IV SOLUTION) ×2 IMPLANT
PACK PERI GYN (CUSTOM PROCEDURE TRAY) ×2 IMPLANT
PAD ARMBOARD 7.5X6 YLW CONV (MISCELLANEOUS) ×2 IMPLANT
SET BASIN LINEN APH (SET/KITS/TRAYS/PACK) ×2 IMPLANT
SPONGE GAUZE 2X2 8PLY STRL LF (GAUZE/BANDAGES/DRESSINGS) ×4 IMPLANT
SURGILUBE 3G PEEL PACK STRL (MISCELLANEOUS) ×2 IMPLANT
SUT SILK 0 FSL (SUTURE) ×2 IMPLANT
SUT VIC AB 2-0 CT2 27 (SUTURE) IMPLANT
SYRINGE CONTROL L 12CC (SYRINGE) ×2 IMPLANT
SYRINGE CONTROL LL 12CC (SYRINGE) ×1 IMPLANT

## 2014-08-13 NOTE — Anesthesia Postprocedure Evaluation (Signed)
  Anesthesia Post-op Note  Patient: Lance Frazier  Procedure(s) Performed: Procedure(s): EXTENSIVE HEMORRHOIDECTOMY (N/A)  Patient Location: PACU  Anesthesia Type:General  Level of Consciousness: awake, alert  and oriented  Airway and Oxygen Therapy: Patient Spontanous Breathing and Patient connected to face mask oxygen  Post-op Pain: mild  Post-op Assessment: Post-op Vital signs reviewed, Patient's Cardiovascular Status Stable, Respiratory Function Stable, Patent Airway and No signs of Nausea or vomiting  Post-op Vital Signs: Reviewed and stable  Last Vitals:  Filed Vitals:   08/13/14 1010  BP: 005/11  Resp:     Complications: No apparent anesthesia complications

## 2014-08-13 NOTE — Interval H&P Note (Signed)
History and Physical Interval Note:  08/13/2014 9:24 AM  Lance Frazier  has presented today for surgery, with the diagnosis of thrombosed external hemorrhoids  The various methods of treatment have been discussed with the patient and family. After consideration of risks, benefits and other options for treatment, the patient has consented to  Procedure(s): EXTENSIVE HEMORRHOIDECTOMY (N/A) as a surgical intervention .  The patient's history has been reviewed, patient examined, no change in status, stable for surgery.  I have reviewed the patient's chart and labs.  Questions were answered to the patient's satisfaction.     Aviva Signs A

## 2014-08-13 NOTE — Discharge Instructions (Signed)
Hemorrhoidectomy °Care After °Hemorrhoidectomy is the removal of enlarged (dilated) veins around the rectum. Until the surgical areas are healed, control of pain and avoiding constipation are the greatest challenges for patients.  °For as long as 24 hours after receiving an anesthetic (the medication that made you sleep), and while taking narcotic pain relievers, you may feel dizzy, weak and drowsy. For that reason, the following information applies to the first 24-hour period following surgery, and continues for as long as you are taking narcotic pain medications. °· Do not drive a car, ride a bicycle, participate in activities in which you could be hurt. Do not take public transportation until you are off narcotic pain medications and until your caregiver says it is okay. °· Do not drink alcohol, take tranquilizers, or medications not prescribed or allowed by your surgical caregiver. °· Do not sign important papers or contracts for at least 24 hours or while taking narcotic medications. °· Have a responsible person with you for 24 hours. °RISKS AND COMPLICATIONS °Some problems that may occur following this procedure include: °· Infection. A germ starts growing in the tissue surrounding the site operated on. This can usually be treated with antibiotics. °· Damage to the rectal sphincter could occur. This is the muscle that opens in your anus to allow a bowel movement. This could cause incontinence. This is uncommon. °· Bleeding following surgery can be a complication of almost any surgery. Your surgeon takes every precaution to keep this from happening. °· Complications of anesthesia. °HOME CARE INSTRUCTIONS °· Avoid straining when having bowel movements. °· Avoid heavy lifting (more than 10 pounds (4.5 kilograms)). °· Only take over-the-counter or prescription medicines for pain, discomfort, or fever as directed by your caregiver. °· Take hot sitz baths for 20 to 30 minutes, 3 to 4 times per day. °· To keep  swelling down, apply an ice pack for twenty minutes three to four times per day between sitz baths. Use a towel between your skin and the ice pack. Do not do this if it causes too much discomfort. °· Keep anal area clean and dry. Following a bowel movement, you can gently wash the area with tucks (available for purchase at a drugstore) or cotton swabs. Gently pat the area dry. Do not rub the area. °· Eat a well balanced diet and drink 6 to 8 glasses of water every day to avoid constipation. A bulk laxative may be also be helpful. °SEEK MEDICAL CARE IF:  °· You have increasing pain or tenderness near or in the surgical site. °· You are unable to eat or drink. °· You develop nausea or vomiting. °· You develop uncontrolled bleeding such as soaking two to three pads in one hour. °· You have constipation, not helped by changing your diet or increasing your fluid intake. Pain medications are a common cause of constipation. °· You have pain and redness (inflammation) extending outside the area of your surgery. °· You develop an unexplained oral temperature above 102° F (38.9° C), or any other signs of infection. °· You have any other questions or concerns following surgery. °Document Released: 01/26/2004 Document Revised: 01/28/2012 Document Reviewed: 04/25/2009 °[employer]® Patient Information ©2015 [employer]. This information is not intended to replace advice given to you by your health care provider. Make sure you discuss any questions you have with your health care provider. ° °

## 2014-08-13 NOTE — Transfer of Care (Signed)
Immediate Anesthesia Transfer of Care Note  Patient: Lance Frazier  Procedure(s) Performed: Procedure(s): EXTENSIVE HEMORRHOIDECTOMY (N/A)  Patient Location: PACU  Anesthesia Type:General  Level of Consciousness: awake, alert  and oriented  Airway & Oxygen Therapy: Patient Spontanous Breathing and Patient connected to face mask oxygen  Post-op Assessment: Report given to PACU RN  Post vital signs: Reviewed and stable  Complications: No apparent anesthesia complications

## 2014-08-13 NOTE — Anesthesia Procedure Notes (Signed)
Procedure Name: LMA Insertion Date/Time: 08/13/2014 10:23 AM Performed by: Tressie Stalker E Pre-anesthesia Checklist: Patient identified, Patient being monitored, Emergency Drugs available, Timeout performed and Suction available Patient Re-evaluated:Patient Re-evaluated prior to inductionOxygen Delivery Method: Circle System Utilized Preoxygenation: Pre-oxygenation with 100% oxygen Intubation Type: IV induction Ventilation: Mask ventilation without difficulty LMA: LMA inserted LMA Size: 4.0 Number of attempts: 1 Placement Confirmation: positive ETCO2 and breath sounds checked- equal and bilateral

## 2014-08-13 NOTE — Op Note (Signed)
Patient:  Lance Frazier  DOB:  1961/10/13  MRN:  [id number]   Preop Diagnosis:  Rectal pain, hemorrhoidal disease  Postop Diagnosis:  Same  Procedure:  Extensive hemorrhoidectomy  Surgeon:  Aviva Signs, M.D.  Anes:  General  Indications:  Patient is a 53 year old white male who presents with intermittent rectal plain and prolapsing hemorrhoid. He has had hemorrhoid surgery in the past. The risks and benefits of the procedure including bleeding, infection, and recurrence of hemorrhoidal disease were fully explained to the patient, who gave informed consent.  Procedure note:  The patient was placed in lithotomy position after general anesthesia was administered. The perineum was prepped and draped using the usual sterile technique with Betadine. Surgical site confirmation was performed.  On rectal examination, the patient had an internal and external hemorrhoid which were mild in nature at the 4:00 position. Another internal hemorrhoid was noted at the 7:00 position. There was no evidence of an anal fissure. The external sphincter allowed 2 fingerbreadths to be placed without difficulty. No internal sphincter stricture was noted. Both the 4:00 and 7:00 hemorrhoidal tissues were removed in a collar-like fashion using the LigaSure. 0.5% Sensorcaine was instilled the surrounding peritoneum. Surgicel and Viscous Xylocaine rectal packing was then placed.  All tape and needle counts were correct at the end of the procedure. The patient was awakened and transferred to PACU in stable condition.  Complications:  None  EBL:  None  Specimen:  Hemorrhoids

## 2014-08-13 NOTE — Anesthesia Preprocedure Evaluation (Signed)
Anesthesia Evaluation  Patient identified by MRN, date of birth, ID band Patient awake    Reviewed: Allergy & Precautions, H&P , NPO status , Patient's Chart, lab work & pertinent test results  Airway Mallampati: I TM Distance: >3 FB Neck ROM: Full    Dental  (+) Teeth Intact   Pulmonary sleep apnea (s/p UVPP) and Continuous Positive Airway Pressure Ventilation ,  breath sounds clear to auscultation        Cardiovascular hypertension, Pt. on medications Rhythm:Regular Rate:Normal     Neuro/Psych Anxiety  Neuromuscular disease    GI/Hepatic   Endo/Other  diabetes, Well Controlled, Type 2, Oral Hypoglycemic AgentsMorbid obesity  Renal/GU      Musculoskeletal  (+) Fibromyalgia -  Abdominal (+) + obese,   Peds  Hematology   Anesthesia Other Findings   Reproductive/Obstetrics                           Anesthesia Physical Anesthesia Plan  ASA: III  Anesthesia Plan: General   Post-op Pain Management:    Induction: Intravenous  Airway Management Planned: LMA  Additional Equipment:   Intra-op Plan:   Post-operative Plan: Extubation in OR  Informed Consent: I have reviewed the patients History and Physical, chart, labs and discussed the procedure including the risks, benefits and alternatives for the proposed anesthesia with the patient or authorized representative who has indicated his/her understanding and acceptance.     Plan Discussed with:   Anesthesia Plan Comments: (Possible GOT if LMA is not satisfactory.)        Anesthesia Quick Evaluation

## 2014-08-13 NOTE — Progress Notes (Signed)
Rectal packing d/c'd. No rectal drainage. Tolerated well. 

## 2014-08-13 NOTE — Interval H&P Note (Signed)
History and Physical Interval Note:  08/13/2014 9:38 AM  [name]  has presented today for surgery, with the diagnosis of thrombosed external hemorrhoids  The various methods of treatment have been discussed with the patient and family. After consideration of risks, benefits and other options for treatment, the patient has consented to  Procedure(s): EXTENSIVE HEMORRHOIDECTOMY (N/A) as a surgical intervention .  The patient's history has been reviewed, patient examined, no change in status, stable for surgery.  I have reviewed the patient's chart and labs.  Questions were answered to the patient's satisfaction.     Aviva Signs A

## 2014-08-13 NOTE — Progress Notes (Signed)
Graham crackers and peanut butter given to pt to eat. Tolerated well.

## 2014-08-13 NOTE — Progress Notes (Signed)
No rooms available in postop. Will d/c from PACU.

## 2014-08-13 NOTE — Progress Notes (Signed)
D/C instructions given to wife. Voiced understanding.

## 2014-08-13 NOTE — Progress Notes (Signed)
Awake. Denies pain. Dressed self. D/C to home in good condition.

## 2014-08-16 ENCOUNTER — Encounter (HOSPITAL_COMMUNITY): Payer: Self-pay | Admitting: General Surgery

## 2014-08-30 ENCOUNTER — Encounter (HOSPITAL_COMMUNITY): Payer: Self-pay | Admitting: Pharmacy Technician

## 2014-08-31 ENCOUNTER — Encounter (HOSPITAL_COMMUNITY): Payer: Self-pay

## 2014-08-31 ENCOUNTER — Encounter (HOSPITAL_COMMUNITY)
Admission: RE | Admit: 2014-08-31 | Discharge: 2014-08-31 | Disposition: A | Discharge status: Home / Self Care | Payer: BC Managed Care – PPO | Source: Ambulatory Visit | Attending: Neurosurgery | Admitting: Neurosurgery

## 2014-08-31 DIAGNOSIS — I10 Essential (primary) hypertension: Secondary | ICD-10-CM | POA: Diagnosis not present

## 2014-08-31 DIAGNOSIS — E785 Hyperlipidemia, unspecified: Secondary | ICD-10-CM | POA: Diagnosis not present

## 2014-08-31 DIAGNOSIS — E119 Type 2 diabetes mellitus without complications: Secondary | ICD-10-CM | POA: Insufficient documentation

## 2014-08-31 DIAGNOSIS — G4733 Obstructive sleep apnea (adult) (pediatric): Secondary | ICD-10-CM | POA: Insufficient documentation

## 2014-08-31 DIAGNOSIS — M1288 Other specific arthropathies, not elsewhere classified, other specified site: Secondary | ICD-10-CM | POA: Insufficient documentation

## 2014-08-31 DIAGNOSIS — M4316 Spondylolisthesis, lumbar region: Secondary | ICD-10-CM | POA: Insufficient documentation

## 2014-08-31 DIAGNOSIS — Z0000 Encounter for general adult medical examination without abnormal findings: Secondary | ICD-10-CM | POA: Diagnosis present

## 2014-08-31 LAB — BASIC METABOLIC PANEL
Anion gap: 16 — ABNORMAL HIGH (ref 5–15)
BUN: 11 mg/dL (ref 6–23)
CALCIUM: 9.5 mg/dL (ref 8.4–10.5)
CO2: 21 meq/L (ref 19–32)
CREATININE: 0.96 mg/dL (ref 0.50–1.35)
Chloride: 99 mEq/L (ref 96–112)
GFR calc Af Amer: >90 mL/min (ref >90)
GFR calc non Af Amer: >90 mL/min (ref >90)
Glucose, Bld: 217 mg/dL — ABNORMAL HIGH (ref 70–99)
Potassium: 4.8 mEq/L (ref 3.7–5.3)
SODIUM: 136 meq/L — AB (ref 137–147)

## 2014-08-31 LAB — CBC
HEMATOCRIT: 46.9 % (ref 39.0–52.0)
HEMOGLOBIN: 17.2 g/dL — AB (ref 13.0–17.0)
MCH: 30.7 pg (ref 26.0–34.0)
MCHC: 36.7 g/dL — ABNORMAL HIGH (ref 30.0–36.0)
MCV: 83.6 fL (ref 78.0–100.0)
Platelets: 238 10*3/uL (ref 150–400)
RBC: 5.61 MIL/uL (ref 4.22–5.81)
RDW: 12.6 % (ref 11.5–15.5)
WBC: 8.8 10*3/uL (ref 4.0–10.5)

## 2014-08-31 LAB — TYPE AND SCREEN
ABO/RH(D): A POS
Antibody Screen: NEGATIVE

## 2014-08-31 LAB — SURGICAL PCR SCREEN
MRSA, PCR: NEGATIVE
Staphylococcus aureus: NEGATIVE

## 2014-08-31 LAB — ABO/RH: ABO/RH(D): A POS

## 2014-08-31 NOTE — Pre-Procedure Instructions (Signed)
Lance Frazier  08/31/2014   Your procedure is scheduled on:  09/08/14  Report to [facility] Admitting at 630 AM.  Call this number if you have problems the morning of surgery: [phone]   Remember:   Do not eat food or drink liquids after midnight.   Take these medicines the morning of surgery with A SIP OF WATER: percocet   Do not wear jewelry, make-up or nail polish.  Do not wear lotions, powders, or perfumes. You may wear deodorant.  Do not shave 48 hours prior to surgery. Men may shave face and neck.  Do not bring valuables to the hospital.  [facility] is not responsible                  for any belongings or valuables.               Contacts, dentures or bridgework may not be worn into surgery.  Leave suitcase in the car. After surgery it may be brought to your room.  For patients admitted to the hospital, discharge time is determined by your                treatment team.               Patients discharged the day of surgery will not be allowed to drive  home.  Name and phone number of your driver: family  Special Instructions: Incentive Spirometry - Practice and bring it with you on the day of surgery.   Please read over the following fact sheets that you were given: Pain Booklet, Coughing and Deep Breathing, MRSA Information and Surgical Site Infection Prevention

## 2014-09-01 NOTE — Progress Notes (Addendum)
Anesthesia Chart Review:  Pt is 53 year old male scheduled for L4-5 laminectomy with posterior lumbar interbody fusion with interbody prosthesis, posterior lateral arthrodesis and posterior nonsegmental instrumentation on 09/08/14 with Dr. Arnoldo Morale.   PMH: HTN, DM, OSA, hyperlipidemia, BMI 44.63.   Medications include: lisinopril, metformin  Preoperative labs reviewed.  Blood glucose 217  Chest x-ray reviewed. No active cardiopulmonary disease.  EKG: Sinus tachycardia, 109 bpm Left posterior fascicular block Inferior infarct , age undetermined Cannot rule out Anterior infarct , age undetermined  Exercise stress test 05/29/2012:  Negative graded exercise test revealing good exercise tolerance, no or arrhythmias and no evidence for myocardial ischemia.  Discussed with Dr. Marcie Bal.  Pt will need cardiology clearance prior to surgery. Relayed this message to Manuela Schwartz in Dr. Arnoldo Morale office.   Willeen Cass, FNP-BC [facility]/Anesthesiology Phone: [phone] 09/01/2014 2:58 PM  09/06/2014 Addendum:   Pt had nuclear stress test on 09/06/2014.  Impression: 1. Resting EKG demonstrates NSR, low voltage complexes, poor R-wave progression, cannot exclude inferior infarct old.  Stress EKG was non diagnostic for ischemia as a pharmacologic stress test. Stress symptoms included dyspnea, headache and nausea.  2. The LV was normal in size both at rest and with stress images. Perfusion imaging study demonstrates mild apical thinning, a small sized insignificant inferoapical ischemia cannot be completely excluded. LV systolic function calculated as 69%. This represents a low risk study. Based on the study patient can be taken up for upcoming surgery with acceptable cardiovascular risk.   Willeen Cass, FNP-BC [facility]/Anesthesiology Phone: [phone] 09/06/2014 3:13 PM

## 2014-09-07 ENCOUNTER — Ambulatory Visit: Payer: BC Managed Care – PPO | Admitting: Cardiovascular Disease

## 2014-09-07 MED ORDER — CEFAZOLIN SODIUM 10 G IJ SOLR
3.0000 g | INTRAMUSCULAR | Status: AC
  Administered 2014-09-08: 3 g via INTRAVENOUS
  Filled 2014-09-07: qty 3000

## 2014-09-08 ENCOUNTER — Inpatient Hospital Stay (HOSPITAL_COMMUNITY): Payer: BC Managed Care – PPO

## 2014-09-08 ENCOUNTER — Inpatient Hospital Stay (HOSPITAL_COMMUNITY)
Admission: RE | Admit: 2014-09-08 | Discharge: 2014-09-09 | DRG: 460 | Disposition: A | Discharge status: Home / Self Care | Payer: BC Managed Care – PPO | Source: Ambulatory Visit | Attending: Neurosurgery | Admitting: Neurosurgery

## 2014-09-08 ENCOUNTER — Inpatient Hospital Stay (HOSPITAL_COMMUNITY): Payer: BC Managed Care – PPO | Admitting: Anesthesiology

## 2014-09-08 ENCOUNTER — Encounter (HOSPITAL_COMMUNITY): Payer: Self-pay | Admitting: *Deleted

## 2014-09-08 ENCOUNTER — Encounter (HOSPITAL_COMMUNITY): Payer: BC Managed Care – PPO | Admitting: Emergency Medicine

## 2014-09-08 ENCOUNTER — Encounter (HOSPITAL_COMMUNITY)
Admission: RE | Disposition: A | Discharge status: Home / Self Care | Payer: Self-pay | Source: Ambulatory Visit | Attending: Neurosurgery

## 2014-09-08 DIAGNOSIS — I10 Essential (primary) hypertension: Secondary | ICD-10-CM | POA: Diagnosis present

## 2014-09-08 DIAGNOSIS — M4316 Spondylolisthesis, lumbar region: Principal | ICD-10-CM | POA: Diagnosis present

## 2014-09-08 DIAGNOSIS — M797 Fibromyalgia: Secondary | ICD-10-CM | POA: Diagnosis present

## 2014-09-08 DIAGNOSIS — G4733 Obstructive sleep apnea (adult) (pediatric): Secondary | ICD-10-CM | POA: Diagnosis present

## 2014-09-08 DIAGNOSIS — Z79899 Other long term (current) drug therapy: Secondary | ICD-10-CM | POA: Diagnosis not present

## 2014-09-08 DIAGNOSIS — E119 Type 2 diabetes mellitus without complications: Secondary | ICD-10-CM | POA: Diagnosis present

## 2014-09-08 DIAGNOSIS — Z6841 Body Mass Index (BMI) 40.0 and over, adult: Secondary | ICD-10-CM

## 2014-09-08 DIAGNOSIS — M5416 Radiculopathy, lumbar region: Secondary | ICD-10-CM | POA: Diagnosis present

## 2014-09-08 DIAGNOSIS — M4310 Spondylolisthesis, site unspecified: Secondary | ICD-10-CM

## 2014-09-08 DIAGNOSIS — M4806 Spinal stenosis, lumbar region: Secondary | ICD-10-CM | POA: Diagnosis present

## 2014-09-08 DIAGNOSIS — E785 Hyperlipidemia, unspecified: Secondary | ICD-10-CM | POA: Diagnosis present

## 2014-09-08 DIAGNOSIS — M549 Dorsalgia, unspecified: Secondary | ICD-10-CM | POA: Diagnosis present

## 2014-09-08 HISTORY — PX: LUMBAR FUSION: SHX111

## 2014-09-08 LAB — GLUCOSE, CAPILLARY
GLUCOSE-CAPILLARY: 183 mg/dL — AB (ref 70–99)
GLUCOSE-CAPILLARY: 212 mg/dL — AB (ref 70–99)
GLUCOSE-CAPILLARY: 207 mg/dL — AB (ref 70–99)
Glucose-Capillary: 271 mg/dL — ABNORMAL HIGH (ref 70–99)
Glucose-Capillary: 261 mg/dL — ABNORMAL HIGH (ref 70–99)

## 2014-09-08 SURGERY — POSTERIOR LUMBAR FUSION 1 LEVEL
Anesthesia: General | Site: Back

## 2014-09-08 MED ORDER — GLYCOPYRROLATE 0.2 MG/ML IJ SOLN
INTRAMUSCULAR | Status: DC | PRN
  Administered 2014-09-08: 0.6 mg via INTRAVENOUS

## 2014-09-08 MED ORDER — FENTANYL CITRATE 0.05 MG/ML IJ SOLN
INTRAMUSCULAR | Status: AC
  Filled 2014-09-08: qty 5

## 2014-09-08 MED ORDER — PROPOFOL 10 MG/ML IV BOLUS
INTRAVENOUS | Status: AC
  Filled 2014-09-08: qty 20

## 2014-09-08 MED ORDER — HYDROMORPHONE HCL 1 MG/ML IJ SOLN
0.2500 mg | INTRAMUSCULAR | Status: DC | PRN
  Administered 2014-09-08 (×4): 0.5 mg via INTRAVENOUS

## 2014-09-08 MED ORDER — INSULIN ASPART 100 UNIT/ML ~~LOC~~ SOLN
0.0000 [IU] | SUBCUTANEOUS | Status: DC
  Administered 2014-09-08: 11 [IU] via SUBCUTANEOUS

## 2014-09-08 MED ORDER — PROMETHAZINE HCL 25 MG/ML IJ SOLN: 6.2500 mg | INTRAMUSCULAR | Status: DC | PRN

## 2014-09-08 MED ORDER — LIDOCAINE HCL (CARDIAC) 20 MG/ML IV SOLN
INTRAVENOUS | Status: DC | PRN
Start: 2014-09-08 — End: 2014-09-08
  Administered 2014-09-08: 50 mg via INTRAVENOUS

## 2014-09-08 MED ORDER — ARTIFICIAL TEARS OP OINT
TOPICAL_OINTMENT | OPHTHALMIC | Status: AC
  Filled 2014-09-08: qty 3.5

## 2014-09-08 MED ORDER — METFORMIN HCL 500 MG PO TABS
1000.0000 mg | ORAL_TABLET | Freq: Two times a day (BID) | ORAL | Status: DC
  Administered 2014-09-08 – 2014-09-09 (×2): 1000 mg via ORAL
  Filled 2014-09-08 (×4): qty 2

## 2014-09-08 MED ORDER — ACETAMINOPHEN 325 MG PO TABS
650.0000 mg | ORAL_TABLET | ORAL | Status: DC | PRN
Start: 2014-09-08 — End: 2014-09-09

## 2014-09-08 MED ORDER — DOCUSATE SODIUM 100 MG PO CAPS
100.0000 mg | ORAL_CAPSULE | Freq: Two times a day (BID) | ORAL | Status: DC
  Administered 2014-09-08 – 2014-09-09 (×2): 100 mg via ORAL
  Filled 2014-09-08 (×3): qty 1

## 2014-09-08 MED ORDER — NEOSTIGMINE METHYLSULFATE 10 MG/10ML IV SOLN
INTRAVENOUS | Status: DC | PRN
  Administered 2014-09-08: 4 mg via INTRAVENOUS

## 2014-09-08 MED ORDER — THROMBIN 20000 UNITS EX SOLR
CUTANEOUS | Status: DC | PRN
  Administered 2014-09-08: 09:00:00 via TOPICAL

## 2014-09-08 MED ORDER — ONDANSETRON HCL 4 MG/2ML IJ SOLN
INTRAMUSCULAR | Status: AC
  Filled 2014-09-08: qty 2

## 2014-09-08 MED ORDER — MIDAZOLAM HCL 5 MG/5ML IJ SOLN
INTRAMUSCULAR | Status: DC | PRN
  Administered 2014-09-08 (×2): 1 mg via INTRAVENOUS

## 2014-09-08 MED ORDER — INSULIN ASPART 100 UNIT/ML ~~LOC~~ SOLN
0.0000 [IU] | Freq: Three times a day (TID) | SUBCUTANEOUS | Status: DC
  Administered 2014-09-09: 4 [IU] via SUBCUTANEOUS

## 2014-09-08 MED ORDER — NEOSTIGMINE METHYLSULFATE 10 MG/10ML IV SOLN
INTRAVENOUS | Status: AC
  Filled 2014-09-08: qty 1

## 2014-09-08 MED ORDER — DIPHENHYDRAMINE HCL 50 MG/ML IJ SOLN
INTRAMUSCULAR | Status: DC | PRN
  Administered 2014-09-08: 12.5 mg via INTRAVENOUS

## 2014-09-08 MED ORDER — DIAZEPAM 5 MG PO TABS
5.0000 mg | ORAL_TABLET | Freq: Four times a day (QID) | ORAL | Status: DC | PRN
  Administered 2014-09-08 – 2014-09-09 (×3): 5 mg via ORAL
  Filled 2014-09-08 (×2): qty 1

## 2014-09-08 MED ORDER — PHENYLEPHRINE HCL 10 MG/ML IJ SOLN
INTRAMUSCULAR | Status: DC | PRN
  Administered 2014-09-08 (×2): 40 ug via INTRAVENOUS

## 2014-09-08 MED ORDER — ONDANSETRON HCL 4 MG/2ML IJ SOLN: 4.0000 mg | INTRAMUSCULAR | Status: DC | PRN

## 2014-09-08 MED ORDER — PROPOFOL 10 MG/ML IV BOLUS
INTRAVENOUS | Status: DC | PRN
  Administered 2014-09-08: 200 mg via INTRAVENOUS

## 2014-09-08 MED ORDER — 0.9 % SODIUM CHLORIDE (POUR BTL) OPTIME
TOPICAL | Status: DC | PRN
  Administered 2014-09-08: 1000 mL

## 2014-09-08 MED ORDER — ROCURONIUM BROMIDE 50 MG/5ML IV SOLN
INTRAVENOUS | Status: AC
  Filled 2014-09-08: qty 1

## 2014-09-08 MED ORDER — OXYCODONE HCL 5 MG/5ML PO SOLN: 5.0000 mg | Freq: Once | ORAL | Status: DC | PRN

## 2014-09-08 MED ORDER — DIPHENHYDRAMINE HCL 50 MG/ML IJ SOLN: 10.0000 mg | Freq: Once | INTRAMUSCULAR | Status: DC

## 2014-09-08 MED ORDER — HYDROCODONE-ACETAMINOPHEN 5-325 MG PO TABS: 1.0000 | ORAL_TABLET | ORAL | Status: DC | PRN

## 2014-09-08 MED ORDER — MEPERIDINE HCL 25 MG/ML IJ SOLN: 6.2500 mg | INTRAMUSCULAR | Status: DC | PRN

## 2014-09-08 MED ORDER — LACTATED RINGERS IV SOLN: INTRAVENOUS | Status: DC

## 2014-09-08 MED ORDER — PHENYLEPHRINE 40 MCG/ML (10ML) SYRINGE FOR IV PUSH (FOR BLOOD PRESSURE SUPPORT)
PREFILLED_SYRINGE | INTRAVENOUS | Status: AC
  Filled 2014-09-08: qty 10

## 2014-09-08 MED ORDER — GLYCOPYRROLATE 0.2 MG/ML IJ SOLN
INTRAMUSCULAR | Status: AC
  Filled 2014-09-08: qty 3

## 2014-09-08 MED ORDER — DIAZEPAM 5 MG PO TABS
ORAL_TABLET | ORAL | Status: AC
Start: 2014-09-08 — End: 2014-09-08
  Administered 2014-09-08: 5 mg via ORAL
  Filled 2014-09-08: qty 1

## 2014-09-08 MED ORDER — BACITRACIN ZINC 500 UNIT/GM EX OINT
TOPICAL_OINTMENT | CUTANEOUS | Status: DC | PRN
  Administered 2014-09-08: 1 via TOPICAL

## 2014-09-08 MED ORDER — SODIUM CHLORIDE 0.9 % IR SOLN
Status: DC | PRN
  Administered 2014-09-08: 09:00:00

## 2014-09-08 MED ORDER — DEXAMETHASONE SODIUM PHOSPHATE 4 MG/ML IJ SOLN
INTRAMUSCULAR | Status: AC
  Filled 2014-09-08: qty 2

## 2014-09-08 MED ORDER — CEFAZOLIN SODIUM-DEXTROSE 2-3 GM-% IV SOLR
2.0000 g | Freq: Three times a day (TID) | INTRAVENOUS | Status: AC
  Administered 2014-09-08 – 2014-09-09 (×2): 2 g via INTRAVENOUS
  Filled 2014-09-08 (×2): qty 50

## 2014-09-08 MED ORDER — OXYCODONE-ACETAMINOPHEN 5-325 MG PO TABS
1.0000 | ORAL_TABLET | ORAL | Status: DC | PRN
  Administered 2014-09-08 – 2014-09-09 (×6): 2 via ORAL
  Filled 2014-09-08 (×5): qty 2

## 2014-09-08 MED ORDER — ALUM & MAG HYDROXIDE-SIMETH 200-200-20 MG/5ML PO SUSP: 30.0000 mL | Freq: Four times a day (QID) | ORAL | Status: DC | PRN

## 2014-09-08 MED ORDER — INSULIN ASPART 100 UNIT/ML ~~LOC~~ SOLN
0.0000 [IU] | Freq: Every day | SUBCUTANEOUS | Status: DC
  Administered 2014-09-08: 2 [IU] via SUBCUTANEOUS

## 2014-09-08 MED ORDER — LIDOCAINE HCL (CARDIAC) 20 MG/ML IV SOLN
INTRAVENOUS | Status: AC
  Filled 2014-09-08: qty 5

## 2014-09-08 MED ORDER — BUPIVACAINE-EPINEPHRINE (PF) 0.5% -1:200000 IJ SOLN
INTRAMUSCULAR | Status: DC | PRN
  Administered 2014-09-08: 10 mL via PERINEURAL

## 2014-09-08 MED ORDER — HYDROMORPHONE HCL 1 MG/ML IJ SOLN
INTRAMUSCULAR | Status: AC
  Administered 2014-09-08: 0.5 mg via INTRAVENOUS
  Filled 2014-09-08: qty 1

## 2014-09-08 MED ORDER — LACTATED RINGERS IV SOLN
INTRAVENOUS | Status: DC | PRN
  Administered 2014-09-08 (×2): via INTRAVENOUS

## 2014-09-08 MED ORDER — ATORVASTATIN CALCIUM 40 MG PO TABS
40.0000 mg | ORAL_TABLET | Freq: Every day | ORAL | Status: DC
  Administered 2014-09-08: 40 mg via ORAL
  Filled 2014-09-08 (×2): qty 1

## 2014-09-08 MED ORDER — BUPIVACAINE LIPOSOME 1.3 % IJ SUSP
20.0000 mL | Freq: Once | INTRAMUSCULAR | Status: DC
  Filled 2014-09-08: qty 20

## 2014-09-08 MED ORDER — FENTANYL CITRATE 0.05 MG/ML IJ SOLN
INTRAMUSCULAR | Status: DC | PRN
  Administered 2014-09-08 (×2): 50 ug via INTRAVENOUS
  Administered 2014-09-08: 200 ug via INTRAVENOUS
  Administered 2014-09-08: 25 ug via INTRAVENOUS
  Administered 2014-09-08: 50 ug via INTRAVENOUS
  Administered 2014-09-08: 25 ug via INTRAVENOUS
  Administered 2014-09-08 (×2): 50 ug via INTRAVENOUS

## 2014-09-08 MED ORDER — ACETAMINOPHEN 650 MG RE SUPP: 650.0000 mg | RECTAL | Status: DC | PRN

## 2014-09-08 MED ORDER — DIPHENHYDRAMINE HCL 50 MG/ML IJ SOLN
INTRAMUSCULAR | Status: AC
  Filled 2014-09-08: qty 1

## 2014-09-08 MED ORDER — CARVEDILOL 6.25 MG PO TABS
6.2500 mg | ORAL_TABLET | Freq: Two times a day (BID) | ORAL | Status: DC
  Administered 2014-09-08 – 2014-09-09 (×2): 6.25 mg via ORAL
  Filled 2014-09-08 (×4): qty 1

## 2014-09-08 MED ORDER — OXYCODONE HCL 5 MG PO TABS: 5.0000 mg | ORAL_TABLET | Freq: Once | ORAL | Status: DC | PRN

## 2014-09-08 MED ORDER — MENTHOL 3 MG MT LOZG: 1.0000 | LOZENGE | OROMUCOSAL | Status: DC | PRN

## 2014-09-08 MED ORDER — ARTIFICIAL TEARS OP OINT
TOPICAL_OINTMENT | OPHTHALMIC | Status: DC | PRN
  Administered 2014-09-08: 1 via OPHTHALMIC

## 2014-09-08 MED ORDER — SUCCINYLCHOLINE CHLORIDE 20 MG/ML IJ SOLN
INTRAMUSCULAR | Status: AC
  Filled 2014-09-08: qty 1

## 2014-09-08 MED ORDER — LISINOPRIL 2.5 MG PO TABS
2.5000 mg | ORAL_TABLET | Freq: Two times a day (BID) | ORAL | Status: DC
  Administered 2014-09-08 – 2014-09-09 (×2): 2.5 mg via ORAL
  Filled 2014-09-08 (×3): qty 1

## 2014-09-08 MED ORDER — PHENOL 1.4 % MT LIQD: 1.0000 | OROMUCOSAL | Status: DC | PRN

## 2014-09-08 MED ORDER — MORPHINE SULFATE 2 MG/ML IJ SOLN
1.0000 mg | INTRAMUSCULAR | Status: DC | PRN
  Administered 2014-09-08: 4 mg via INTRAVENOUS
  Filled 2014-09-08: qty 2

## 2014-09-08 MED ORDER — BUPIVACAINE LIPOSOME 1.3 % IJ SUSP
INTRAMUSCULAR | Status: DC | PRN
  Administered 2014-09-08: 20 mL

## 2014-09-08 MED ORDER — OXYCODONE-ACETAMINOPHEN 5-325 MG PO TABS
ORAL_TABLET | ORAL | Status: AC
  Administered 2014-09-08: 2 via ORAL
  Filled 2014-09-08: qty 2

## 2014-09-08 MED ORDER — ROCURONIUM BROMIDE 100 MG/10ML IV SOLN
INTRAVENOUS | Status: DC | PRN
  Administered 2014-09-08: 50 mg via INTRAVENOUS
  Administered 2014-09-08: 20 mg via INTRAVENOUS
  Administered 2014-09-08: 10 mg via INTRAVENOUS

## 2014-09-08 MED ORDER — MIDAZOLAM HCL 2 MG/2ML IJ SOLN: 0.5000 mg | Freq: Once | INTRAMUSCULAR | Status: DC | PRN

## 2014-09-08 MED ORDER — MIDAZOLAM HCL 2 MG/2ML IJ SOLN
INTRAMUSCULAR | Status: AC
  Filled 2014-09-08: qty 2

## 2014-09-08 MED ORDER — ONDANSETRON HCL 4 MG/2ML IJ SOLN
INTRAMUSCULAR | Status: DC | PRN
  Administered 2014-09-08: 4 mg via INTRAVENOUS

## 2014-09-08 MED ORDER — DEXAMETHASONE SODIUM PHOSPHATE 4 MG/ML IJ SOLN
INTRAMUSCULAR | Status: DC | PRN
  Administered 2014-09-08: 8 mg via INTRAVENOUS

## 2014-09-08 MED ORDER — DEXTROSE 5 % IV SOLN
10.0000 mg | INTRAVENOUS | Status: DC | PRN
  Administered 2014-09-08: 15 ug/min via INTRAVENOUS

## 2014-09-08 SURGICAL SUPPLY — 67 items
APL SKNCLS STERI-STRIP NONHPOA (GAUZE/BANDAGES/DRESSINGS) ×1
BAG DECANTER FOR FLEXI CONT (MISCELLANEOUS) ×2 IMPLANT
BENZOIN TINCTURE PRP APPL 2/3 (GAUZE/BANDAGES/DRESSINGS) ×2 IMPLANT
BLADE CLIPPER SURG (BLADE) IMPLANT
BRUSH SCRUB EZ PLAIN DRY (MISCELLANEOUS) ×2 IMPLANT
BUR MATCHSTICK NEURO 3.0 LAGG (BURR) ×2 IMPLANT
BUR PRECISION FLUTE 6.0 (BURR) ×2 IMPLANT
CANISTER SUCT 3000ML (MISCELLANEOUS) ×2 IMPLANT
CAP REVERE LOCKING (Cap) ×4 IMPLANT
CONT SPEC 4OZ CLIKSEAL STRL BL (MISCELLANEOUS) ×2 IMPLANT
COVER TABLE BACK 60X90 (DRAPES) ×2 IMPLANT
DRAPE C-ARM 42X72 X-RAY (DRAPES) ×4 IMPLANT
DRAPE LAPAROTOMY 100X72X124 (DRAPES) ×2 IMPLANT
DRAPE POUCH INSTRU U-SHP 10X18 (DRAPES) ×2 IMPLANT
DRAPE PROXIMA HALF (DRAPES) ×2 IMPLANT
DRAPE SURG 17X23 STRL (DRAPES) ×8 IMPLANT
ELECT BLADE 4.0 EZ CLEAN MEGAD (MISCELLANEOUS) ×2
ELECT REM PT RETURN 9FT ADLT (ELECTROSURGICAL) ×2
ELECTRODE BLDE 4.0 EZ CLN MEGD (MISCELLANEOUS) ×1 IMPLANT
ELECTRODE REM PT RTRN 9FT ADLT (ELECTROSURGICAL) ×1 IMPLANT
EVACUATOR 1/8 PVC DRAIN (DRAIN) IMPLANT
GAUZE SPONGE 4X4 12PLY STRL (GAUZE/BANDAGES/DRESSINGS) ×2 IMPLANT
GAUZE SPONGE 4X4 16PLY XRAY LF (GAUZE/BANDAGES/DRESSINGS) ×2 IMPLANT
GLOVE BIO SURGEON STRL SZ8.5 (GLOVE) ×4 IMPLANT
GLOVE BIOGEL PI IND STRL 7.0 (GLOVE) IMPLANT
GLOVE BIOGEL PI INDICATOR 7.0 (GLOVE) ×1
GLOVE EXAM NITRILE LRG STRL (GLOVE) IMPLANT
GLOVE EXAM NITRILE MD LF STRL (GLOVE) IMPLANT
GLOVE EXAM NITRILE XL STR (GLOVE) IMPLANT
GLOVE EXAM NITRILE XS STR PU (GLOVE) IMPLANT
GLOVE OPTIFIT SS 6.5 STRL BRWN (GLOVE) ×3 IMPLANT
GLOVE SS BIOGEL STRL SZ 8 (GLOVE) ×2 IMPLANT
GLOVE SUPERSENSE BIOGEL SZ 8 (GLOVE) ×2
GOWN STRL REUS W/ TWL LRG LVL3 (GOWN DISPOSABLE) IMPLANT
GOWN STRL REUS W/ TWL XL LVL3 (GOWN DISPOSABLE) ×2 IMPLANT
GOWN STRL REUS W/TWL 2XL LVL3 (GOWN DISPOSABLE) IMPLANT
GOWN STRL REUS W/TWL LRG LVL3 (GOWN DISPOSABLE) ×2
GOWN STRL REUS W/TWL XL LVL3 (GOWN DISPOSABLE) ×6
KIT BASIN OR (CUSTOM PROCEDURE TRAY) ×2 IMPLANT
KIT ROOM TURNOVER OR (KITS) ×2 IMPLANT
NDL HYPO 21X1.5 SAFETY (NEEDLE) IMPLANT
NEEDLE HYPO 21X1.5 SAFETY (NEEDLE) ×2 IMPLANT
NEEDLE HYPO 22GX1.5 SAFETY (NEEDLE) ×2 IMPLANT
NS IRRIG 1000ML POUR BTL (IV SOLUTION) ×2 IMPLANT
PACK LAMINECTOMY NEURO (CUSTOM PROCEDURE TRAY) ×2 IMPLANT
PAD ARMBOARD 7.5X6 YLW CONV (MISCELLANEOUS) ×6 IMPLANT
PATTIES SURGICAL .5 X1 (DISPOSABLE) IMPLANT
ROD CURVED REVERE 6.35X50MM (Rod) ×2 IMPLANT
SCREW 7.5X50MM (Screw) ×2 IMPLANT
SCREW REVERE 6.35 75X55MM (Screw) ×1 IMPLANT
SCREW REVERE 6.5X50MM (Screw) ×1 IMPLANT
SPACER SUSTAIN O 10X26 15MM (Spacer) ×2 IMPLANT
SPONGE LAP 4X18 X RAY DECT (DISPOSABLE) IMPLANT
SPONGE NEURO XRAY DETECT 1X3 (DISPOSABLE) IMPLANT
SPONGE SURGIFOAM ABS GEL 100 (HEMOSTASIS) ×2 IMPLANT
STRIP BIOACTIVE 20CC 25X100X8 (Miscellaneous) ×1 IMPLANT
STRIP CLOSURE SKIN 1/2X4 (GAUZE/BANDAGES/DRESSINGS) ×2 IMPLANT
SUT VIC AB 1 CT1 18XBRD ANBCTR (SUTURE) ×2 IMPLANT
SUT VIC AB 1 CT1 8-18 (SUTURE) ×4
SUT VIC AB 2-0 CP2 18 (SUTURE) ×4 IMPLANT
SYR 20CC LL (SYRINGE) ×1 IMPLANT
SYR 20ML ECCENTRIC (SYRINGE) ×2 IMPLANT
TAPE STRIPS DRAPE STRL (GAUZE/BANDAGES/DRESSINGS) ×1 IMPLANT
TOWEL OR 17X24 6PK STRL BLUE (TOWEL DISPOSABLE) ×2 IMPLANT
TOWEL OR 17X26 10 PK STRL BLUE (TOWEL DISPOSABLE) ×2 IMPLANT
TRAY FOLEY CATH 14FRSI W/METER (CATHETERS) ×2 IMPLANT
WATER STERILE IRR 1000ML POUR (IV SOLUTION) ×2 IMPLANT

## 2014-09-08 NOTE — Anesthesia Postprocedure Evaluation (Signed)
  Anesthesia Post-op Note  Patient: Lance Frazier  Procedure(s) Performed: Procedure(s) with comments: POSTERIOR LUMBAR FUSION 1 LEVEL (N/A) - L45 lamienctomy with posterior lumbar interbody fusion interbody prosthesis posterior lateral arthrodesis and posterior nonsegmental instrumentation  Patient Location: PACU  Anesthesia Type:General  Level of Consciousness: awake, alert , oriented and patient cooperative  Airway and Oxygen Therapy: Patient Spontanous Breathing and Patient connected to nasal cannula oxygen  Post-op Pain: mild  Post-op Assessment: Post-op Vital signs reviewed, Patient's Cardiovascular Status Stable, Respiratory Function Stable, Patent Airway, No signs of Nausea or vomiting and Pain level controlled  Post-op Vital Signs: Reviewed and stable  Last Vitals:  Filed Vitals:   09/08/14 1345  BP: 99/50  Pulse: 74  Temp:   Resp: 18    Complications: No apparent anesthesia complications

## 2014-09-08 NOTE — Op Note (Signed)
Brief history: The patient is a 53 year old white male who has complained of back and leg pain consistent with neurogenic claudication. He has failed medical management and was worked up with a lumbar MRI. This demonstrated severe facet arthropathy and a spondylolisthesis at L4-5. I discussed the various treatment options with the patient including surgery. He has weighed the risks, benefits, and alternatives surgery and decided proceed with an L4-5 decompression, instrumentation, and fusion.  Preoperative diagnosis: L4-5 spondylolisthesis, facet arthropathy, spinal stenosis compressing both the L4 and the L5 nerve roots; lumbago; lumbar radiculopathy  Postoperative diagnosis: The same  Procedure: Bilateral L4 Laminotomy/foraminotomies to decompress the bilateral L4 and L5 nerve roots(the work required to do this was in addition to the work required to do the posterior lumbar interbody fusion because of the patient's spinal stenosis, facet arthropathy. Etc. requiring a wide decompression of the nerve roots.); L4-5 posterior lumbar interbody fusion with local morselized autograft bone and Kinnex graft extender; insertion of interbody prosthesis at L4-5 (globus peek interbody prosthesis); posterior nonsegmental instrumentation from L4 to L5 with globus titanium pedicle screws and rods; posterior lateral arthrodesis at L4-5 with local morselized autograft bone and Kinnex bone graft extender.  Surgeon: Dr. Earle Gell  Asst.: Dr. Dominica Severin cram  Anesthesia: Gen. endotracheal  Estimated blood loss: 200 cc  Drains: One medium Hemovac  Complications: None  Description of procedure: The patient was brought to the operating room by the anesthesia team. General endotracheal anesthesia was induced. The patient was turned to the prone position on the Wilson frame. The patient's lumbosacral region was then prepared with Betadine scrub and Betadine solution. Sterile drapes were applied.  I then injected the area  to be incised with Marcaine with epinephrine solution. I then used the scalpel to make a linear midline incision over the L4-5 interspace. I then used electrocautery to perform a bilateral subperiosteal dissection exposing the spinous process and lamina of L4 and L5. We then obtained intraoperative radiograph to confirm our location. We then inserted the Verstrac retractor to provide exposure.  I began the decompression by using the high speed drill to perform laminotomies at L4 bilaterally. We then used the Kerrison punches to widen the laminotomy and removed the ligamentum flavum at L4-5. We used the Kerrison punches to remove the medial facets at L4-5. We performed wide foraminotomies about the bilateral L4 and L5 nerve roots completing the decompression.  We now turned our attention to the posterior lumbar interbody fusion. I used a scalpel to incise the intervertebral disc at L4-5. I then performed a partial intervertebral discectomy at L4-5 using the pituitary forceps. We prepared the vertebral endplates at L8-7 for the fusion by removing the soft tissues with the curettes. We then used the trial spacers to pick the appropriate sized interbody prosthesis. We prefilled his prosthesis with a combination of local morselized autograft bone that we obtained during the decompression as well as Actifuse bone graft extender. We inserted the prefilled prosthesis into the interspace at L4-5. There was a good snug fit of the prosthesis in the interspace. We then filled and the remainder of the intervertebral disc space with local morselized autograft bone and Actifuse. This completed the posterior lumbar interbody arthrodesis.  We now turned attention to the instrumentation. Under fluoroscopic guidance we cannulated the bilateral L4 and L5 pedicles with the bone probe. We then removed the bone probe. We then tapped the pedicle with a 6.5 millimeter tap. We then removed the tap. We probed inside the tapped pedicle  with a ball probe to rule out cortical breaches. We then inserted a 6.5 and 7.5 x 50 and 55 millimeter pedicle screw into the L4 and L5 pedicles bilaterally under fluoroscopic guidance. We then palpated along the medial aspect of the pedicles to rule out cortical breaches. There were none. The nerve roots were not injured. We then connected the unilateral pedicle screws with a lordotic rod. We compressed the construct and secured the rod in place with the caps. We then tightened the caps appropriately. This completed the instrumentation from L4-5.  We now turned our attention to the posterior lateral arthrodesis at L4-5. We used the high-speed drill to decorticate the remainder of the facets, pars, transverse process at L4-5. We then applied a combination of local morselized autograft bone and Vitoss bone graft extender over these decorticated posterior lateral structures. This completed the posterior lateral arthrodesis.  We then obtained hemostasis using bipolar electrocautery. We irrigated the wound out with bacitracin solution. We inspected the thecal sac and nerve roots and noted they were well decompressed. We then removed the retractor. We placed a medium Hemovac drain in the epidural space and tunneled out through separate stab wound. We reapproximated patient's thoracolumbar fascia with interrupted #1 Vicryl suture. We reapproximated patient's subcutaneous tissue with interrupted 2-0 Vicryl suture. The reapproximated patient's skin with Steri-Strips and benzoin. The wound was then coated with bacitracin ointment. A sterile dressing was applied. The drapes were removed. The patient was subsequently returned to the supine position where they were extubated by the anesthesia team. He was then transported to the post anesthesia care unit in stable condition. All sponge instrument and needle counts were reportedly correct at the end of this case.

## 2014-09-08 NOTE — Progress Notes (Signed)
Pt stated he was not ready to go on CPAP at this time, he was going to walk more and then would be ready to go on CPAP

## 2014-09-08 NOTE — Progress Notes (Signed)
Patient ID: Lance Frazier, male   DOB: July 10, 1961, 53 y.o.   MRN: [id number] Subjective:  The patient is alert and pleasant. He looks great. He has no complaints.  Objective: Vital signs in last 24 hours: Temp:  [97.7 F (36.5 C)-99.1 F (37.3 C)] 97.7 F (36.5 C) (10/21 1420) Pulse Rate:  [66-89] 89 (10/21 1624) Resp:  [18-22] 18 (10/21 1624) BP: (97-133)/(47-76) 130/76 mmHg (10/21 1624) SpO2:  [95 %-100 %] 95 % (10/21 1624)  Intake/Output from previous day:   Intake/Output this shift: Total I/O In: 2480 [P.O.:480; I.V.:2000] Out: 455 [Urine:255; Drains:200]  Physical exam the patient is alert and oriented x3. He is moving his lower extremities well.  Lab Results: No results found for this basename: WBC, HGB, HCT, PLT,  in the last 72 hours BMET No results found for this basename: NA, K, CL, CO2, GLUCOSE, BUN, CREATININE, CALCIUM,  in the last 72 hours  Studies/Results: Dg Lumbar Spine 2-3 Views  09/08/2014   CLINICAL DATA:  L4-5 PLIF  EXAM: DG C-ARM 61-120 MIN; LUMBAR SPINE - 2-3 VIEW  TECHNIQUE: Two views from portable C-arm radiography obtained in the operating room  CONTRAST:  No contrast  FLUOROSCOPY TIME:  18 seconds  COMPARISON:  None  FINDINGS: Two images of the lumbar spine show placement up interbody spacer at the L4-5 disc space and bilateral pedicle screws at L4 and L5.  IMPRESSION: 1. L4-5 PLIF.   Electronically Signed   By: Kerby Moors M.Frazier.   On: 09/08/2014 11:50   Dg Lumbar Spine 1 View  09/08/2014   CLINICAL DATA:  Localization imaging for lumbar spine fusion.  EXAM: LUMBAR SPINE - 1 VIEW  COMPARISON:  07/15/2014  FINDINGS: Surgical probe has been inserted between skin retractors. The tip lies below the spinous process of L3, at the axial level of the upper endplate of L4 approximately 1 cm be low the center of the L3-L4 disc.  IMPRESSION: Surgical localization image as described.   Electronically Signed   By: Lajean Manes M.Frazier.   On: 09/08/2014 11:43   Dg  C-arm 1-60 Min  09/08/2014   CLINICAL DATA:  L4-5 PLIF  EXAM: DG C-ARM 61-120 MIN; LUMBAR SPINE - 2-3 VIEW  TECHNIQUE: Two views from portable C-arm radiography obtained in the operating room  CONTRAST:  No contrast  FLUOROSCOPY TIME:  18 seconds  COMPARISON:  None  FINDINGS: Two images of the lumbar spine show placement up interbody spacer at the L4-5 disc space and bilateral pedicle screws at L4 and L5.  IMPRESSION: 1. L4-5 PLIF.   Electronically Signed   By: Kerby Moors M.Frazier.   On: 09/08/2014 11:50    Assessment/Plan: The patient is doing very well.  LOS: 0 days     Lance Frazier 09/08/2014, 6:48 PM

## 2014-09-08 NOTE — Progress Notes (Signed)
[facility] Progress Note Patient Details:  [name] [birth year] [id number] Called in order to [employer]. Patient ID: Lance Frazier, male   DOB: 1961/09/05, 53 y.o.   MRN: [id number]   Darrol Poke 09/08/2014, 3:25 PM

## 2014-09-08 NOTE — H&P (Signed)
Subjective: The patient is a 53 year old white male who has complained of back, buttock and leg pain consistent with neurogenic claudication. He has failed medical management and was worked up with lumbar x-rays a lumbar MRI. This demonstrated a L4-5 spondylolisthesis with spinal stenosis. I discussed the various treatment option with the patient including surgery. He has decided proceed with surgery after weighing the risks, benefits, and alternatives.   Past Medical History  Diagnosis Date  . Hypertension   . Arthritis   . Anxiety   . Diabetes mellitus   . Obstructive sleep apnea 1989    CPAP treatment since 1989; status post uvulectomy  . Marijuana use   . Obesity     BMI of 40.4 at a weight of 265 pounds  . Hyperlipidemia 05/28/2012  . Chest pain, precordial 05/27/2012    Myocardial infarction ruled out in 05/2012; atypical chest discomfort, most likely of musculoskeletal origin.   . Fibromyalgia     Past Surgical History  Procedure Laterality Date  . Knee arthroscopy      right x2  . Uvulectomy    . Hernia repair      umbilical-[facility]  . Tonsillectomy    . Hemorrhoid surgery  02/06/2012    Procedure: HEMORRHOIDECTOMY;  Surgeon: Jamesetta So, MD;  Location: [facility];  Service: General;  Laterality: N/A;  . Colonoscopy  11/2009    Negative screening study  . Hemorrhoid surgery N/A 08/13/2014    Procedure: EXTENSIVE HEMORRHOIDECTOMY;  Surgeon: Jamesetta So, MD;  Location: [facility];  Service: General;  Laterality: N/A;    No Known Allergies  History  Substance Use Topics  . Smoking status: Never Smoker   . Smokeless tobacco: Not on file  . Alcohol Use: No    Family History  Problem Relation Age of Onset  . Anesthesia problems Neg Hx   . Hypotension Neg Hx   . Malignant hyperthermia Neg Hx   . Pseudochol deficiency Neg Hx   . Coronary artery disease Father 107    Acute MI   Prior to Admission medications   Medication Sig Start Date End Date Taking? Authorizing Provider   glucose blood (BAYER CONTOUR TEST) test strip 1 each by Other route See admin instructions. Check blood sugar daily.   Yes Historical Provider, MD  lisinopril (PRINIVIL,ZESTRIL) 2.5 MG tablet Take 2.5 mg by mouth 2 (two) times daily.   Yes Historical Provider, MD  metFORMIN (GLUCOPHAGE) 1000 MG tablet Take 1,000 mg by mouth 2 (two) times daily with a meal.   Yes Historical Provider, MD  oxyCODONE-acetaminophen (PERCOCET) 7.5-325 MG per tablet Take 3 tablets by mouth daily as needed for pain.   Yes Historical Provider, MD     Review of Systems  Positive ROS: As above  All other systems have been reviewed and were otherwise negative with the exception of those mentioned in the HPI and as above.  Objective: Vital signs in last 24 hours: Temp:  [98.3 F (36.8 C)] 98.3 F (36.8 C) (10/21 0725) Pulse Rate:  [72] 72 (10/21 0725) Resp:  [20] 20 (10/21 0725) BP: (133)/(67) 133/67 mmHg (10/21 0725) SpO2:  [97 %] 97 % (10/21 0725)  General Appearance: Alert, cooperative, no distress, Head: Normocephalic, without obvious abnormality, atraumatic Eyes: PERRL, conjunctiva/corneas clear, EOM's intact,    Ears: Normal  Throat: Normal  Neck: Supple, symmetrical, trachea midline, no adenopathy; thyroid: No enlargement/tenderness/nodules; no carotid bruit or JVD Back: Symmetric, no curvature, ROM normal, no CVA tenderness Lungs: Clear to auscultation  bilaterally, respirations unlabored Heart: Regular rate and rhythm, no murmur, rub or gallop Abdomen: Soft, non-tender,, no masses, no organomegaly Extremities: Extremities normal, atraumatic, no cyanosis or edema Pulses: 2+ and symmetric all extremities Skin: Skin color, texture, turgor normal, no rashes or lesions  NEUROLOGIC:   Mental status: alert and oriented, no aphasia, good attention span, Fund of knowledge/ memory ok Motor Exam - grossly normal Sensory Exam - grossly normal Reflexes:  Coordination - grossly normal Gait - grossly  normal Balance - grossly normal Cranial Nerves: I: smell Not tested  II: visual acuity  OS: Normal  OD: Normal   II: visual fields Full to confrontation  II: pupils Equal, round, reactive to light  III,VII: ptosis None  III,IV,VI: extraocular muscles  Full ROM  V: mastication Normal  V: facial light touch sensation  Normal  V,VII: corneal reflex  Present  VII: facial muscle function - upper  Normal  VII: facial muscle function - lower Normal  VIII: hearing Not tested  IX: soft palate elevation  Normal  IX,X: gag reflex Present  XI: trapezius strength  5/5  XI: sternocleidomastoid strength 5/5  XI: neck flexion strength  5/5  XII: tongue strength  Normal    Data Review Lab Results  Component Value Date   WBC 8.8 08/31/2014   HGB 17.2* 08/31/2014   HCT 46.9 08/31/2014   MCV 83.6 08/31/2014   PLT 238 08/31/2014   Lab Results  Component Value Date   NA 136* 08/31/2014   K 4.8 08/31/2014   CL 99 08/31/2014   CO2 21 08/31/2014   BUN 11 08/31/2014   CREATININE 0.96 08/31/2014   GLUCOSE 217* 08/31/2014   No results found for this basename: INR, PROTIME    Assessment/Plan: L4-5 spondylolisthesis, spinal stenosis, lumbago, lumbar radiculopathy, neurogenic claudication: I have discussed the situation with the patient. I have reviewed his imaging studies with them and pointed out the abnormalities. We have discussed the various treatment options including surgery. I have described the surgical treatment option L4-5 decompression, instrumentation, and fusion. I have shown him surgical models. We have discussed the risks, benefits, alternatives, and likelihood of achieving our goals with surgery. I have answered all the patient's questions. He has decided proceed with surgery.   Elvenia Godden D 09/08/2014 8:13 AM

## 2014-09-08 NOTE — Progress Notes (Signed)
Subjective:  The patient is somnolent but easily arousable. He looks well. He is in no apparent distress.  Objective: Vital signs in last 24 hours: Temp:  [98.3 F (36.8 C)] 98.3 F (36.8 C) (10/21 0725) Pulse Rate:  [72] 72 (10/21 0725) Resp:  [20] 20 (10/21 0725) BP: (133)/(67) 133/67 mmHg (10/21 0725) SpO2:  [97 %] 97 % (10/21 0725)  Intake/Output from previous day:   Intake/Output this shift: Total I/O In: 2000 [I.V.:2000] Out: 105 [Urine:105]  Physical exam the patient is somnolent but arousable. He is moving his lower extremities well.  Lab Results: No results found for this basename: WBC, HGB, HCT, PLT,  in the last 72 hours BMET No results found for this basename: NA, K, CL, CO2, GLUCOSE, BUN, CREATININE, CALCIUM,  in the last 72 hours  Studies/Results: Dg Lumbar Spine 2-3 Views  09/08/2014   CLINICAL DATA:  L4-5 PLIF  EXAM: DG C-ARM 61-120 MIN; LUMBAR SPINE - 2-3 VIEW  TECHNIQUE: Two views from portable C-arm radiography obtained in the operating room  CONTRAST:  No contrast  FLUOROSCOPY TIME:  18 seconds  COMPARISON:  None  FINDINGS: Two images of the lumbar spine show placement up interbody spacer at the L4-5 disc space and bilateral pedicle screws at L4 and L5.  IMPRESSION: 1. L4-5 PLIF.   Electronically Signed   By: Kerby Moors M.D.   On: 09/08/2014 11:50   Dg Lumbar Spine 1 View  09/08/2014   CLINICAL DATA:  Localization imaging for lumbar spine fusion.  EXAM: LUMBAR SPINE - 1 VIEW  COMPARISON:  07/15/2014  FINDINGS: Surgical probe has been inserted between skin retractors. The tip lies below the spinous process of L3, at the axial level of the upper endplate of L4 approximately 1 cm be low the center of the L3-L4 disc.  IMPRESSION: Surgical localization image as described.   Electronically Signed   By: Lajean Manes M.D.   On: 09/08/2014 11:43   Dg C-arm 1-60 Min  09/08/2014   CLINICAL DATA:  L4-5 PLIF  EXAM: DG C-ARM 61-120 MIN; LUMBAR SPINE - 2-3 VIEW   TECHNIQUE: Two views from portable C-arm radiography obtained in the operating room  CONTRAST:  No contrast  FLUOROSCOPY TIME:  18 seconds  COMPARISON:  None  FINDINGS: Two images of the lumbar spine show placement up interbody spacer at the L4-5 disc space and bilateral pedicle screws at L4 and L5.  IMPRESSION: 1. L4-5 PLIF.   Electronically Signed   By: Kerby Moors M.D.   On: 09/08/2014 11:50    Assessment/Plan: The patient is doing well.  LOS: 0 days     Tannisha Kennington D 09/08/2014, 12:16 PM

## 2014-09-08 NOTE — Anesthesia Preprocedure Evaluation (Addendum)
Anesthesia Evaluation  Patient identified by MRN, date of birth, ID band Patient awake    Reviewed: Allergy & Precautions, H&P , NPO status , Patient's Chart, lab work & pertinent test results  History of Anesthesia Complications Negative for: history of anesthetic complications  Airway Mallampati: I TM Distance: <3 FB Neck ROM: Full    Dental  (+) Teeth Intact, Dental Advisory Given   Pulmonary sleep apnea and Continuous Positive Airway Pressure Ventilation , Current Smoker (marijuana),  breath sounds clear to auscultation        Cardiovascular hypertension, Pt. on medications - anginaRhythm:Regular Rate:Normal  09/06/14 stress test: systolic function calculated as 69%. This represents a low risk study   Neuro/Psych PSYCHIATRIC DISORDERS Anxiety Chronic back pain: narcotics    GI/Hepatic negative GI ROS, Neg liver ROS, (+)     substance abuse  marijuana use,   Endo/Other  diabetes (glu 183), Well Controlled, Type 2, Oral Hypoglycemic AgentsMorbid obesity  Renal/GU negative Renal ROS     Musculoskeletal  (+) Fibromyalgia - (marijuana use 2x/week), narcotic dependent  Abdominal (+) + obese,   Peds  Hematology   Anesthesia Other Findings   Reproductive/Obstetrics                       Anesthesia Physical Anesthesia Plan  ASA: III  Anesthesia Plan: General   Post-op Pain Management:    Induction: Intravenous  Airway Management Planned: Oral ETT  Additional Equipment:   Intra-op Plan:   Post-operative Plan: Extubation in OR  Informed Consent: I have reviewed the patients History and Physical, chart, labs and discussed the procedure including the risks, benefits and alternatives for the proposed anesthesia with the patient or authorized representative who has indicated his/her understanding and acceptance.   Dental advisory given  Plan Discussed with: CRNA and Surgeon  Anesthesia  Plan Comments: (Plan routine monitors, GETA)        Anesthesia Quick Evaluation

## 2014-09-08 NOTE — Transfer of Care (Signed)
Immediate Anesthesia Transfer of Care Note  Patient: FRANKLYN CAFARO  Procedure(s) Performed: Procedure(s) with comments: POSTERIOR LUMBAR FUSION 1 LEVEL (N/A) - L45 lamienctomy with posterior lumbar interbody fusion interbody prosthesis posterior lateral arthrodesis and posterior nonsegmental instrumentation  Patient Location: PACU  Anesthesia Type:General  Level of Consciousness: awake and alert   Airway & Oxygen Therapy: Patient Spontanous Breathing and Patient connected to nasal cannula oxygen  Post-op Assessment: Report given to PACU RN, Post -op Vital signs reviewed and stable and Patient moving all extremities  Post vital signs: Reviewed and stable  Complications: No apparent anesthesia complications

## 2014-09-08 NOTE — Progress Notes (Signed)
Utilization review completed.  

## 2014-09-09 LAB — CBC
HEMATOCRIT: 40.5 % (ref 39.0–52.0)
HEMOGLOBIN: 14.2 g/dL (ref 13.0–17.0)
MCH: 29.6 pg (ref 26.0–34.0)
MCHC: 35.1 g/dL (ref 30.0–36.0)
MCV: 84.6 fL (ref 78.0–100.0)
Platelets: 222 10*3/uL (ref 150–400)
RBC: 4.79 MIL/uL (ref 4.22–5.81)
RDW: 13 % (ref 11.5–15.5)
WBC: 14.7 10*3/uL — AB (ref 4.0–10.5)

## 2014-09-09 LAB — BASIC METABOLIC PANEL
Anion gap: 13 (ref 5–15)
BUN: 14 mg/dL (ref 6–23)
CHLORIDE: 94 meq/L — AB (ref 96–112)
CO2: 28 meq/L (ref 19–32)
Calcium: 9.4 mg/dL (ref 8.4–10.5)
Creatinine, Ser: 1.02 mg/dL (ref 0.50–1.35)
GFR calc Af Amer: >90 mL/min (ref >90)
GFR calc non Af Amer: 81 mL/min — ABNORMAL LOW (ref >90)
GLUCOSE: 181 mg/dL — AB (ref 70–99)
POTASSIUM: 4.5 meq/L (ref 3.7–5.3)
Sodium: 135 mEq/L — ABNORMAL LOW (ref 137–147)

## 2014-09-09 LAB — GLUCOSE, CAPILLARY
GLUCOSE-CAPILLARY: 177 mg/dL — AB (ref 70–99)
Glucose-Capillary: 169 mg/dL — ABNORMAL HIGH (ref 70–99)

## 2014-09-09 MED ORDER — DSS 100 MG PO CAPS
100.0000 mg | ORAL_CAPSULE | Freq: Two times a day (BID) | ORAL | Status: DC
Start: 2014-09-09 — End: 2014-12-21

## 2014-09-09 MED ORDER — DIAZEPAM 5 MG PO TABS: 5.0000 mg | ORAL_TABLET | Freq: Four times a day (QID) | ORAL | Status: DC | PRN

## 2014-09-09 MED ORDER — OXYCODONE-ACETAMINOPHEN 10-325 MG PO TABS: 1.0000 | ORAL_TABLET | ORAL | Status: DC | PRN

## 2014-09-09 MED FILL — Heparin Sodium (Porcine) Inj 1000 Unit/ML: INTRAMUSCULAR | Qty: 30 | Status: AC

## 2014-09-09 MED FILL — Sodium Chloride IV Soln 0.9%: INTRAVENOUS | Qty: 1000 | Status: AC

## 2014-09-09 NOTE — Progress Notes (Signed)
Occupational Therapy Evaluation and Discharge Patient Details Name: Lance Frazier MRN: [id number] DOB: 18-Aug-1961 Today's Date: 09/09/2014    History of Present Illness 53 y.o. male s/p  L4-5 laminectomy with posterior lumbar interbody fusion interbody    Clinical Impression   PTA pt lived at home and was independent with ADLs, however he had difficulty getting into/out of tub and was mostly sponge bathing. Pt requires assistance for LB ADLs due to decreased ROM and back precautions. Education and training provided for DME/AE as well as compensatory techniques for increased independence. No further acute OT needs. DME order placed by RN as pt plans to d/c today.     Follow Up Recommendations  No OT follow up;Supervision - Intermittent (OOB/mobility)    Equipment Recommendations  3 in 1 bedside comode;Tub/shower bench    Recommendations for Other Services       Precautions / Restrictions Precautions Precautions: Back Precaution Booklet Issued: Yes (comment) Precaution Comments: Reviewed back precautions Required Braces or Orthoses: Spinal Brace Spinal Brace: Lumbar corset Restrictions Weight Bearing Restrictions: No      Mobility Bed Mobility Overal bed mobility: Needs Assistance Bed Mobility: Rolling;Sidelying to Sit Rolling: Supervision Sidelying to sit: Supervision       General bed mobility comments: VC's for sequencing and safety. Pt required increased time and use of bed rails.   Transfers Overall transfer level: Needs assistance Equipment used: None Transfers: Sit to/from Stand Sit to Stand: Supervision         General transfer comment: Supervision for safety with VC's for hand placement and sequencing.     Balance Overall balance assessment: Modified Independent                                          ADL Overall ADL's : Needs assistance/impaired Eating/Feeding: Independent;Sitting   Grooming: Standing;Supervision/safety    Upper Body Bathing: Set up;Sitting   Lower Body Bathing: Minimal assistance;Sit to/from stand   Upper Body Dressing : Set up;Sitting;Cueing for sequencing (including back brace)   Lower Body Dressing: Moderate assistance;Sit to/from stand Lower Body Dressing Details (indicate cue type and reason): pt unable to reach Bil feet Toilet Transfer: Ambulation;Supervision/safety (sit<>stand from bed)     Toileting - Clothing Manipulation Details (indicate cue type and reason): educated pt on use of toilet aid (or tongs and baby wipes) for independence with toilet hygiene while maintaining back precautions.    Tub/Shower Transfer Details (indicate cue type and reason): explained use of tub bench vs. 3N1 for tub transfer. Pt is interested in tub bench and cost was discussed.  Functional mobility during ADLs: Supervision/safety General ADL Comments: Pt moving slowly but well. He is limited in reaching Bil feet for bathing and dressing and educated on incorporating back precautions into ADLs through compensatory techniques. Pt's spouse will provide assistance.      Vision  Pt reports no change from baseline.                    Perception Perception Perception Tested?: No   Praxis Praxis Praxis tested?: Within functional limits    Pertinent Vitals/Pain Pain Assessment: 0-10 Pain Score: 3  Pain Location: back Pain Descriptors / Indicators: Sore Pain Intervention(s): Monitored during session;Repositioned        Extremity/Trunk Assessment Upper Extremity Assessment Upper Extremity Assessment: Overall WFL for tasks assessed   Lower Extremity Assessment Lower Extremity Assessment: Generalized  weakness   Cervical / Trunk Assessment Cervical / Trunk Assessment: Normal   Communication Communication Communication: No difficulties   Cognition Arousal/Alertness: Awake/alert Behavior During Therapy: WFL for tasks assessed/performed Overall Cognitive Status: Within Functional  Limits for tasks assessed                                Home Living Family/patient expects to be discharged to:: Private residence Living Arrangements: Spouse/significant other Available Help at Discharge: Family;Available PRN/intermittently Type of Home: House Home Access: Stairs to enter [employer] of Steps: 3-4 Entrance Stairs-Rails: Left Home Layout: Two level Alternate Level Stairs-Number of Steps: 13 Alternate Level Stairs-Rails: Right;Left Bathroom Shower/Tub: Tub/shower unit Shower/tub characteristics: Architectural technologist: Standard     Home Equipment: None          Prior Functioning/Environment Level of Independence: Independent        Comments: pt reports that he has had difficulty getting into/out of his tub and has been sponge bathing (outside during summer).     OT Diagnosis: Generalized weakness;Acute pain                          End of Session Equipment Utilized During Treatment: Back brace Nurse Communication: Mobility status;Other (comment) (need for DME)  Activity Tolerance: Patient tolerated treatment well Patient left: Other (comment) (ambulating in hallway per pt request)   Time: 0762-2633 OT Time Calculation (min): 25 min Charges:  OT General Charges $OT Visit: 1 Procedure OT Evaluation $Initial OT Evaluation Tier I: 1 Procedure OT Treatments $Self Care/Home Management : 8-22 mins  Juluis Rainier 09/09/2014, 11:35 AM  Cyndie Chime, OTR/L Occupational Therapist [phone] (pager)

## 2014-09-09 NOTE — Discharge Summary (Signed)
  Physician Discharge Summary  Patient ID: Lance Frazier MRN: [id number] DOB/AGE: 02-03-1961 53 y.o.  Admit date: 09/08/2014 Discharge date: 09/09/2014  Admission Diagnoses: L4-5 spondylolisthesis, facet arthropathy, lumbago, lumbar radiculopathy  Discharge Diagnoses: The same Active Problems:   Spondylolisthesis of lumbar region   Discharged Condition: good  Hospital Course: I performed an L4-5 decompression, instrumentation, and fusion on the patient on 09/08/2014. The surgery went well.  The patient's postoperative course was unremarkable. On postoperative day #1 he requested discharge to home. The patient, his wife, were given oral and written discharge instructions. All their questions were answered.  Consults: None Significant Diagnostic Studies: None Treatments: L4-5 decompression, instrumentation, and fusion. Discharge Exam: Blood pressure 128/71, pulse 78, temperature 99.2 F (37.3 C), temperature source Oral, resp. rate 20, SpO2 96.00%. The patient is alert and pleasant. He looks well. His dressing is clean and dry. His strength is normal.  Disposition: Home     Medication List    STOP taking these medications       oxyCODONE-acetaminophen 7.5-325 MG per tablet  Commonly known as:  PERCOCET  Replaced by:  oxyCODONE-acetaminophen 10-325 MG per tablet      TAKE these medications       BAYER CONTOUR TEST test strip  Generic drug:  glucose blood  1 each by Other route See admin instructions. Check blood sugar daily.     diazepam 5 MG tablet  Commonly known as:  VALIUM  Take 1 tablet (5 mg total) by mouth every 6 (six) hours as needed for muscle spasms.     DSS 100 MG Caps  Take 100 mg by mouth 2 (two) times daily.     lisinopril 2.5 MG tablet  Commonly known as:  PRINIVIL,ZESTRIL  Take 2.5 mg by mouth 2 (two) times daily.     metFORMIN 1000 MG tablet  Commonly known as:  GLUCOPHAGE  Take 1,000 mg by mouth 2 (two) times daily with a meal.     oxyCODONE-acetaminophen 10-325 MG per tablet  Commonly known as:  PERCOCET  Take 1 tablet by mouth every 4 (four) hours as needed for pain.           Follow-up Information   Follow up with Ophelia Charter, MD.   Specialty:  Neurosurgery   Contact information:   [address] [place] [address] [phone]       Signed: Ophelia Charter 09/09/2014, 3:02 PM

## 2014-09-09 NOTE — Progress Notes (Signed)
Patient ID: Lance Frazier, male   DOB: 1961-03-18, 53 y.o.   MRN: [id number] Subjective:  The patient is alert and pleasant. He looks well. He has been ambulating well.  Objective: Vital signs in last 24 hours: Temp:  [97.7 F (36.5 C)-99.1 F (37.3 C)] 98.3 F (36.8 C) (10/22 0754) Pulse Rate:  [66-99] 75 (10/22 0754) Resp:  [18-22] 18 (10/22 0754) BP: (97-130)/(47-76) 109/64 mmHg (10/22 0754) SpO2:  [94 %-100 %] 97 % (10/22 0754)  Intake/Output from previous day: 10/21 0701 - 10/22 0700 In: 2480 [P.O.:480; I.V.:2000] Out: 925 [Urine:555; Drains:370] Intake/Output this shift:    Physical exam the patient is alert and oriented. His dressing is clean and dry. His strength is normal his lower extremities.  Lab Results:  Recent Labs  09/09/14 0553  WBC 14.7*  HGB 14.2  HCT 40.5  PLT 222   BMET  Recent Labs  09/09/14 0553  NA 135*  K 4.5  CL 94*  CO2 28  GLUCOSE 181*  BUN 14  CREATININE 1.02  CALCIUM 9.4    Studies/Results: Dg Lumbar Spine 2-3 Views  09/08/2014   CLINICAL DATA:  L4-5 PLIF  EXAM: DG C-ARM 61-120 MIN; LUMBAR SPINE - 2-3 VIEW  TECHNIQUE: Two views from portable C-arm radiography obtained in the operating room  CONTRAST:  No contrast  FLUOROSCOPY TIME:  18 seconds  COMPARISON:  None  FINDINGS: Two images of the lumbar spine show placement up interbody spacer at the L4-5 disc space and bilateral pedicle screws at L4 and L5.  IMPRESSION: 1. L4-5 PLIF.   Electronically Signed   By: Kerby Moors M.D.   On: 09/08/2014 11:50   Dg Lumbar Spine 1 View  09/08/2014   CLINICAL DATA:  Localization imaging for lumbar spine fusion.  EXAM: LUMBAR SPINE - 1 VIEW  COMPARISON:  07/15/2014  FINDINGS: Surgical probe has been inserted between skin retractors. The tip lies below the spinous process of L3, at the axial level of the upper endplate of L4 approximately 1 cm be low the center of the L3-L4 disc.  IMPRESSION: Surgical localization image as described.    Electronically Signed   By: Lajean Manes M.D.   On: 09/08/2014 11:43   Dg C-arm 1-60 Min  09/08/2014   CLINICAL DATA:  L4-5 PLIF  EXAM: DG C-ARM 61-120 MIN; LUMBAR SPINE - 2-3 VIEW  TECHNIQUE: Two views from portable C-arm radiography obtained in the operating room  CONTRAST:  No contrast  FLUOROSCOPY TIME:  18 seconds  COMPARISON:  None  FINDINGS: Two images of the lumbar spine show placement up interbody spacer at the L4-5 disc space and bilateral pedicle screws at L4 and L5.  IMPRESSION: 1. L4-5 PLIF.   Electronically Signed   By: Kerby Moors M.D.   On: 09/08/2014 11:50    Assessment/Plan: Postop day 1: The patient is doing well. He said he may want to go home later on today. I gave him his oral discharge instructions and answered all the patient's, and his wife's, questions.  LOS: 1 day     Ann Bohne D 09/09/2014, 8:00 AM

## 2014-09-09 NOTE — Evaluation (Addendum)
Physical Therapy Evaluation and Discharge Patient Details Name: Lance Frazier MRN: [id number] DOB: [birth year] Today's Date: 09/09/2014   History of Present Illness  53 y.o. male s/p  L4-5 laminectomy with posterior lumbar interbody fusion interbody   Clinical Impression  Patient evaluated by Physical Therapy with no further acute PT needs identified. All education has been completed and the patient has no further questions. Ambulates up to 300 feet with supervision and safely completed stair training. Due to mild sway with gait feel pt would benefit from use of a cane temporarily and he agrees. See below for any follow-up Physial Therapy or equipment needs. PT is signing off. Thank you for this referral.     Follow Up Recommendations Supervision - Intermittent;No PT follow up    Equipment Recommendations  Cane    Recommendations for Other Services OT consult     Precautions / Restrictions Precautions Precautions: Back Precaution Booklet Issued: Yes (comment) Precaution Comments: Reviewed back precautions Required Braces or Orthoses: Spinal Brace Spinal Brace: Lumbar corset Restrictions Weight Bearing Restrictions: No      Mobility  Bed Mobility               General bed mobility comments:  (Pt declines practicing log roll technique.)  Transfers Overall transfer level: Needs assistance Equipment used: None Transfers: Sit to/from Stand Sit to Stand: Supervision         General transfer comment: Supervision for safety with VC for hand placement and to maintain back precautions  Ambulation/Gait Ambulation/Gait assistance: Supervision Ambulation Distance (Feet): 300 Feet Assistive device: None Gait Pattern/deviations: Step-through pattern;Decreased stride length   Gait velocity interpretation: Below normal speed for age/gender General Gait Details: Supervision for safety. No loss of balance noted. Mild sway with short stride length.  Stairs Stairs:  Yes Stairs assistance: Supervision Stair Management: One rail Left;Step to pattern;Sideways Number of Stairs: 13 General stair comments: VC for sequencing and sideways technique. Pt able to perform this task without loss of balance. He has no further questions.  Wheelchair Mobility    Modified Rankin (Stroke Patients Only)       Balance Overall balance assessment: Modified Independent                                           Pertinent Vitals/Pain Pain Assessment: 0-10 Pain Score: 3  Pain Location: back Pain Descriptors / Indicators:  ("surgical type") Pain Intervention(s): Monitored during session;Repositioned    Home Living Family/patient expects to be discharged to:: Private residence Living Arrangements: Spouse/significant other Available Help at Discharge: Family;Available PRN/intermittently Type of Home: House Home Access: Stairs to enter Entrance Stairs-Rails: Left Entrance Stairs-Number of Steps: 3-4 Home Layout: Two level Home Equipment: None      Prior Function Level of Independence: Independent               Hand Dominance        Extremity/Trunk Assessment   Upper Extremity Assessment: Defer to OT evaluation           Lower Extremity Assessment: Overall WFL for tasks assessed         Communication   Communication: No difficulties  Cognition Arousal/Alertness: Awake/alert Behavior During Therapy: WFL for tasks assessed/performed Overall Cognitive Status: Within Functional Limits for tasks assessed  General Comments General comments (skin integrity, edema, etc.): Reviewed back precautions and discussed safety with mobility.     Exercises        Assessment/Plan    PT Assessment Patent does not need any further PT services  PT Diagnosis Abnormality of gait;Acute pain   PT Problem List    PT Treatment Interventions     PT Goals (Current goals can be found in the Care Plan section)  Acute Rehab PT Goals Patient Stated Goal: Go home today PT Goal Formulation: All assessment and education complete, DC therapy    Frequency     Barriers to discharge        Co-evaluation               End of Session Equipment Utilized During Treatment: Gait belt;Back brace Activity Tolerance: Patient tolerated treatment well Patient left: in chair;with call bell/phone within reach;with family/visitor present Nurse Communication: Mobility status         Time: 2919-1660 PT Time Calculation (min): 19 min   Charges:   PT Evaluation $Initial PT Evaluation Tier I: 1 Procedure PT Treatments $Self Care/Home Management: 8-22   PT G Codes:         Elayne Snare, [place]  Ellouise Newer 09/09/2014, 9:44 AM  Addendum for discharge in title.

## 2014-09-09 NOTE — Progress Notes (Signed)
Patient alert and oriented, mae's well, voiding adequate amount of urine, swallowing without difficulty, c/o minimal  pain. Patient discharged home with family. Script and discharged instructions given to patient. Patient and family stated understanding of d/c instructions given and has an appointment with MD. Aisha Kallee Nam RN  

## 2014-11-15 ENCOUNTER — Encounter: Payer: Self-pay | Admitting: Internal Medicine

## 2014-12-01 ENCOUNTER — Ambulatory Visit: Payer: Self-pay | Admitting: Gastroenterology

## 2014-12-21 ENCOUNTER — Other Ambulatory Visit: Payer: Self-pay

## 2014-12-21 ENCOUNTER — Ambulatory Visit (INDEPENDENT_AMBULATORY_CARE_PROVIDER_SITE_OTHER): Payer: BLUE CROSS/BLUE SHIELD | Admitting: Gastroenterology

## 2014-12-21 ENCOUNTER — Encounter: Payer: Self-pay | Admitting: Gastroenterology

## 2014-12-21 VITALS — BP 120/83 | HR 80 | Temp 97.0°F | Ht 65.0 in | Wt 261.6 lb

## 2014-12-21 DIAGNOSIS — K625 Hemorrhage of anus and rectum: Secondary | ICD-10-CM

## 2014-12-21 DIAGNOSIS — K6289 Other specified diseases of anus and rectum: Secondary | ICD-10-CM

## 2014-12-21 MED ORDER — PEG 3350-KCL-NA BICARB-NACL 420 G PO SOLR: 4000.0000 mL | ORAL | Status: DC

## 2014-12-21 MED ORDER — NITROGLYCERIN 0.4 % RE OINT: 1.0000 [in_us] | TOPICAL_OINTMENT | Freq: Two times a day (BID) | RECTAL | Status: DC

## 2014-12-21 NOTE — Patient Instructions (Signed)
We have scheduled you for a colonoscopy with Dr. Gala Romney in the near future.  In the meantime, start using the cream (Rectiv), inserting into rectum twice a day for up to 3 weeks. Wear gloves when applying, and wash hands thoroughly thereafter. Monitor for headache, dizziness.

## 2014-12-21 NOTE — Progress Notes (Signed)
Referring Provider: Asencion Noble, MD Primary Care Physician:  Asencion Noble, MD Primary Gastroenterologist:  Dr. Gala Romney   Chief Complaint  Patient presents with  . Hemorrhoids    HPI:   Lance Frazier is a 54 y.o. male presenting today at the request of Asencion Noble, MD secondary to rectal pain. Last colonoscopy in 2011 by Dr. Gala Romney overall normal. Has had several hemorrhoid surgeries by Dr. Arnoldo Morale, most recent in Sept 2015.   Notes rectal spasms lasting 2-3 seconds. Blood in BM in stool. Feels like a knife with BM. Lives on Miralax and fiber. Rectal spasms without BM. BM twice a day. No abdominal pain. Feels like has to push harder to get the BM out.   Past Medical History  Diagnosis Date  . Hypertension   . Arthritis   . Anxiety   . Diabetes mellitus   . Obstructive sleep apnea 1989    CPAP treatment since 1989; status post uvulectomy  . Marijuana use   . Obesity     BMI of 40.4 at a weight of 265 pounds  . Hyperlipidemia 05/28/2012  . Chest pain, precordial 05/27/2012    Myocardial infarction ruled out in 05/2012; atypical chest discomfort, most likely of musculoskeletal origin.   . Fibromyalgia     Past Surgical History  Procedure Laterality Date  . Knee arthroscopy      right x2  . Uvulectomy    . Hernia repair      umbilical-[facility]  . Tonsillectomy    . Hemorrhoid surgery  02/06/2012    Procedure: HEMORRHOIDECTOMY;  Surgeon: Jamesetta So, MD;  Location: [facility];  Service: General;  Laterality: N/A;  . Colonoscopy  11/2009    Dr. Gala Romney: single external hemorrhoid tag, anal papilla, normal TI  . Hemorrhoid surgery N/A 08/13/2014    Procedure: EXTENSIVE HEMORRHOIDECTOMY;  Surgeon: Jamesetta So, MD;  Location: [facility];  Service: General;  Laterality: N/A;  . Lumbar fusion  Sep 08, 2014    Current Outpatient Prescriptions  Medication Sig Dispense Refill  . aspirin 81 MG tablet Take 81 mg by mouth daily.    Marland Kitchen atorvastatin (LIPITOR) 40 MG tablet Take 40 mg by mouth  daily.    . canagliflozin (INVOKANA) 300 MG TABS tablet Take 300 mg by mouth daily before breakfast.    . carvedilol (COREG) 6.25 MG tablet Take 6.25 mg by mouth 2 (two) times daily with a meal.    . glucose blood (BAYER CONTOUR TEST) test strip 1 each by Other route See admin instructions. Check blood sugar daily.    Marland Kitchen lisinopril (PRINIVIL,ZESTRIL) 10 MG tablet Take 10 mg by mouth daily.    Marland Kitchen lisinopril (PRINIVIL,ZESTRIL) 2.5 MG tablet Take 2.5 mg by mouth 2 (two) times daily.    Marland Kitchen oxyCODONE-acetaminophen (PERCOCET) 10-325 MG per tablet Take 1 tablet by mouth every 4 (four) hours as needed for pain. 100 tablet 0  . Nitroglycerin (RECTIV) 0.4 % OINT Place 1 inch rectally 2 (two) times daily. For 3 weeks. Wear gloves when applying, wash hands thoroughly afterward. 1 Tube 1  . polyethylene glycol-electrolytes (TRILYTE) 420 G solution Take 4,000 mLs by mouth as directed. 4000 mL 0   No current facility-administered medications for this visit.    Allergies as of 12/21/2014  . (No Known Allergies)    Family History  Problem Relation Age of Onset  . Anesthesia problems Neg Hx   . Hypotension Neg Hx   . Malignant hyperthermia  Neg Hx   . Pseudochol deficiency Neg Hx   . Coronary artery disease Father 1    Acute MI  . Colon cancer Neg Hx     History   Social History  . Marital Status: Married    Spouse Name: N/A    Number of Children: N/A  . Years of Education: N/A   Occupational History  . Not on file.   Social History Main Topics  . Smoking status: Never Smoker   . Smokeless tobacco: Not on file  . Alcohol Use: No  . Drug Use: Yes    Special: Marijuana     Comment: a few times a week  . Sexual Activity: Yes    Birth Control/ Protection: None   Other Topics Concern  . Not on file   Social History Narrative    Review of Systems: Gen: see HPI CV: Denies chest pain, heart palpitations, syncope, peripheral edema. Resp: Denies shortness of breath with rest, cough,  wheezing GI: see HPI GU : urinary frequency MS: post-op surgical discomfort from back pain Derm: Denies rash, itching, dry skin Psych: Denies depression, anxiety, confusion or memory loss  Heme: see HPI  Physical Exam: BP 120/83 mmHg  Pulse 80  Temp(Src) 97 F (36.1 C)  Ht 5\' 5"  (1.651 m)  Wt 261 lb 9.6 oz (118.661 kg)  BMI 43.53 kg/m2 General:   Alert and oriented. Well-developed, well-nourished, pleasant and cooperative. Head:  Normocephalic and atraumatic. Eyes:  Conjunctiva pink, sclera clear, no icterus.   Conjunctiva pink. Ears:  Normal auditory acuity. Nose:  No deformity, discharge,  or lesions. Mouth:  No deformity or lesions, mucosa pink and moist.  Lungs:  Clear to auscultation bilaterally, without wheezing, rales, or rhonchi.  Heart:  S1, S2 present without murmurs noted.  Abdomen:  +BS, soft, non-tender and non-distended. Obese. Difficult to appreciate HSM due to large AP diameter.  Rectal:  Deferred until colonoscopy  Msk:  Symmetrical without gross deformities. Normal posture. Extremities:  Without clubbing or edema. Neurologic:  Alert and  oriented x4;  grossly normal neurologically. Skin:  Intact, warm and dry without significant lesions or rashes Psych:  Alert and cooperative. Normal mood and affect.

## 2014-12-24 ENCOUNTER — Encounter: Payer: Self-pay | Admitting: Gastroenterology

## 2014-12-24 NOTE — Assessment & Plan Note (Signed)
Query benign anorectal source. Colonoscopy planned.

## 2014-12-24 NOTE — Assessment & Plan Note (Signed)
54 year old male with persistent rectal pain, discomfort with bowel habits, and rectal bleeding; surgical intervention for hemorrhoids completed Sept 2015. May have occult anal fissure as culprit for persistent symptoms; last colonoscopy in 2011 unimpressive. Due to persistent rectal bleeding, need to rule out occult malignancy as well. Will treat empirically with Rectiv ointment BID X 3 weeks and pursue lower GI evaluation.   Proceed with TCS with Dr. Gala Romney in near future: the risks, benefits, and alternatives have been discussed with the patient in detail. The patient states understanding and desires to proceed.

## 2015-01-04 ENCOUNTER — Telehealth: Payer: Self-pay | Admitting: Internal Medicine

## 2015-01-04 NOTE — Telephone Encounter (Signed)
noted 

## 2015-01-04 NOTE — Telephone Encounter (Signed)
Pt called to cancel his tcs with [facility] on 2/25 and does not want to reschedule at this time.

## 2015-01-07 ENCOUNTER — Other Ambulatory Visit (HOSPITAL_COMMUNITY): Payer: BLUE CROSS/BLUE SHIELD

## 2015-01-08 NOTE — Progress Notes (Signed)
CC'ED TO PCP 

## 2015-01-13 ENCOUNTER — Ambulatory Visit (HOSPITAL_COMMUNITY)
Admission: RE | Admit: 2015-01-13 | Payer: BLUE CROSS/BLUE SHIELD | Source: Ambulatory Visit | Admitting: Internal Medicine

## 2015-01-13 ENCOUNTER — Encounter (HOSPITAL_COMMUNITY): Admission: RE | Payer: Self-pay | Source: Ambulatory Visit

## 2015-01-13 SURGERY — COLONOSCOPY WITH PROPOFOL
Anesthesia: Monitor Anesthesia Care

## 2015-02-04 ENCOUNTER — Other Ambulatory Visit (HOSPITAL_COMMUNITY): Payer: Self-pay | Admitting: Internal Medicine

## 2015-02-04 ENCOUNTER — Ambulatory Visit (HOSPITAL_COMMUNITY)
Admission: RE | Admit: 2015-02-04 | Discharge: 2015-02-04 | Disposition: A | Discharge status: Home / Self Care | Payer: BLUE CROSS/BLUE SHIELD | Source: Ambulatory Visit | Attending: Internal Medicine | Admitting: Internal Medicine

## 2015-02-04 DIAGNOSIS — R61 Generalized hyperhidrosis: Secondary | ICD-10-CM | POA: Diagnosis not present

## 2015-12-29 ENCOUNTER — Ambulatory Visit (INDEPENDENT_AMBULATORY_CARE_PROVIDER_SITE_OTHER): Payer: BLUE CROSS/BLUE SHIELD | Admitting: Pulmonary Disease

## 2015-12-29 ENCOUNTER — Encounter: Payer: Self-pay | Admitting: Pulmonary Disease

## 2015-12-29 VITALS — BP 140/76 | HR 65 | Wt 269.4 lb

## 2015-12-29 DIAGNOSIS — G4733 Obstructive sleep apnea (adult) (pediatric): Secondary | ICD-10-CM

## 2015-12-29 DIAGNOSIS — G4710 Hypersomnia, unspecified: Secondary | ICD-10-CM

## 2015-12-29 DIAGNOSIS — R0600 Dyspnea, unspecified: Secondary | ICD-10-CM | POA: Insufficient documentation

## 2015-12-29 DIAGNOSIS — R0609 Other forms of dyspnea: Secondary | ICD-10-CM | POA: Diagnosis not present

## 2015-12-29 NOTE — Assessment & Plan Note (Signed)
Advised on weight reduction. Exercise as tolerated. Hopefully with treating sleep apnea, it will help with weight reduction.

## 2015-12-29 NOTE — Assessment & Plan Note (Signed)
Patient most likely has severe obstructive sleep apnea. No sleep study seen in the system. He is compliant. He is on his second machine. Current machine is more than 57 years old. Current machine is malfunctioning. He has hypersomnia, snoring, gasping, choking, was using the current CPAP machine. Patient has also gained 40 pounds the last 10 years. Plan: #1. Continue current CPAP machine. He is on 10 cm water. #2. Plan to get a split-night sleep study. I wanted to get a home study to facilitate but insurance would not cover it. Wait time for a lab study is around 4-6 weeks. We will contact DME to see whether we can get a new machine without getting the sleep study. He has a same insurance the last 10 years. #3. Advised on sleep hygiene. #4. Advised patient not to drive is sleepy. #5. Weight reduction. #6. Advice and getting frequent supplies, mass, tubings. Advised patient cleaning supplies frequently, at least once a week. #7. DME company is [employer] in [place], [place].

## 2015-12-29 NOTE — Assessment & Plan Note (Signed)
Most likely related to weight. His chest  x-ray (March 2016)-no active infiltrate. Low lung volumes. We'll observe for now. May need repeat chest x-ray this year. May need 2-D echo.

## 2015-12-29 NOTE — Assessment & Plan Note (Signed)
Hypersomnia through the years. Might be related to undertreated sleep apnea. Might have underlying obesity hypoventilation syndrome. Plan: #1. Patient will need a sleep study, we'll need a new CPAP machine. #2. Once compliance is Good and sleep apnea is treated, if hypersomnia is persistent, may need to do ABG to rule out obesity hypoventilation syndrome. #3. Advised not to drive her sleepy.

## 2015-12-29 NOTE — Progress Notes (Addendum)
Subjective:    Patient ID: Lance Frazier, male    DOB: June 13, 1961, 55 y.o.   MRN: [id number]  HPI    This is the case of Lance Frazier, 56 y.o. Male, who was referred by Dr. Carloyn Manner fagan  in consultation regarding OSA. .   As you very well know, patient is a non smoker. He dienes any history of asthma, or copd. He has seasonal allergies which are controlled.  He was diagnosed with OSA in 1989.  He had oral surgery -- tonsillectomy, uvulectomy, nasal surgery in 2004 in [place] by ENT as part of Rx for OSA. He had persistent OSA aftrer surgery.  He is on his 2nd machine. Machine is > 10 yrs ago. He is on 10 cm H2O.  No recent DL has been done.   Pt initially felt better with cpap.  Through the yrs, the machine has started to lose its luster. Machine is NOT performing as it used to do. Machine sometimes does not turn on.  It does not deliver same pressure as before. Machine is malfunctioning.   Pt continues to use the cpap.  Pt starting to get sleepy during daytime. Sleeps 12 hrs and still gets unrefreshed sleep.  Has snoring, gasping, choking, apneas despite cpap usage.   Has gained 40 lbs the last 10 yrs. Gets supplies changed yearly. No issues with mask.    Review of Systems  Constitutional: Negative.   HENT: Negative.   Eyes: Negative.   Respiratory: Positive for apnea and choking. Negative for shortness of breath.   Cardiovascular: Negative.   Gastrointestinal: Negative.   Endocrine: Negative.   Genitourinary: Negative.   Musculoskeletal: Negative.   Skin: Negative.   Allergic/Immunologic: Negative.   Neurological: Negative.   Hematological: Negative.   Psychiatric/Behavioral: Negative.   All other systems reviewed and are negative.       Past Medical History  Diagnosis Date  . Hypertension   . Arthritis   . Anxiety   . Diabetes mellitus   . Obstructive sleep apnea 1989    CPAP treatment since 1989; status post uvulectomy  . Marijuana use   . Obesity      BMI of 40.4 at a weight of 265 pounds  . Hyperlipidemia 05/28/2012  . Chest pain, precordial 05/27/2012    Myocardial infarction ruled out in 05/2012; atypical chest discomfort, most likely of musculoskeletal origin.   . Fibromyalgia    (-) CA, DVT  Family History  Problem Relation Age of Onset  . Anesthesia problems Neg Hx   . Hypotension Neg Hx   . Malignant hyperthermia Neg Hx   . Pseudochol deficiency Neg Hx   . Coronary artery disease Father 44    Acute MI  . Colon cancer Neg Hx      Past Surgical History  Procedure Laterality Date  . Knee arthroscopy      right x2  . Uvulectomy    . Hernia repair      umbilical-[facility]  . Tonsillectomy    . Hemorrhoid surgery  02/06/2012    Procedure: HEMORRHOIDECTOMY;  Surgeon: Jamesetta So, MD;  Location: [facility];  Service: General;  Laterality: N/A;  . Colonoscopy  11/2009    Dr. Gala Romney: single external hemorrhoid tag, anal papilla, normal TI  . Hemorrhoid surgery N/A 08/13/2014    Procedure: EXTENSIVE HEMORRHOIDECTOMY;  Surgeon: Jamesetta So, MD;  Location: [facility];  Service: General;  Laterality: N/A;  . Lumbar fusion  Sep 08, 2014  Social History   Social History  . Marital Status: Married    Spouse Name: N/A  . Number of Children: N/A  . Years of Education: N/A   Occupational History  . Not on file.   Social History Main Topics  . Smoking status: Never Smoker   . Smokeless tobacco: Not on file  . Alcohol Use: No  . Drug Use: Yes    Special: Marijuana     Comment: a few times a week  . Sexual Activity: Yes    Birth Control/ Protection: None   Other Topics Concern  . Not on file   Social History Narrative  Married with 4 children. Works with [employer] ([employer] estate. (-) ETOH.  Smokes cannabis for pain 1-2x/week.  Lives in [place].   No Known Allergies   Outpatient Prescriptions Prior to Visit  Medication Sig Dispense Refill  . aspirin 81 MG tablet Take 81 mg by mouth daily.    Marland Kitchen atorvastatin (LIPITOR) 40 MG tablet  Take 40 mg by mouth daily.    . canagliflozin (INVOKANA) 300 MG TABS tablet Take 300 mg by mouth daily before breakfast.    . glucose blood (BAYER CONTOUR TEST) test strip 1 each by Other route See admin instructions. Check blood sugar daily.    Marland Kitchen lisinopril (PRINIVIL,ZESTRIL) 10 MG tablet Take 10 mg by mouth daily.    Marland Kitchen lisinopril (PRINIVIL,ZESTRIL) 2.5 MG tablet Take 2.5 mg by mouth 2 (two) times daily.    . Nitroglycerin (RECTIV) 0.4 % OINT Place 1 inch rectally 2 (two) times daily. For 3 weeks. Wear gloves when applying, wash hands thoroughly afterward. 1 Tube 1  . carvedilol (COREG) 6.25 MG tablet Take 6.25 mg by mouth 2 (two) times daily with a meal. Reported on 12/29/2015    . oxyCODONE-acetaminophen (PERCOCET) 10-325 MG per tablet Take 1 tablet by mouth every 4 (four) hours as needed for pain. (Patient not taking: Reported on 12/29/2015) 100 tablet 0  . polyethylene glycol-electrolytes (TRILYTE) 420 G solution Take 4,000 mLs by mouth as directed. (Patient not taking: Reported on 12/29/2015) 4000 mL 0   No facility-administered medications prior to visit.   Meds ordered this encounter  Medications  . buPROPion (WELLBUTRIN XL) 150 MG 24 hr tablet    Sig: Take 150 mg by mouth daily.    Refill:  3  . cyclobenzaprine (FLEXERIL) 10 MG tablet    Sig: Take 10 mg by mouth daily as needed.    Refill:  0  . glimepiride (AMARYL) 2 MG tablet    Sig: Take 2 mg by mouth daily.    Refill:  3       Objective:   Physical Exam  Vitals:  Filed Vitals:   12/29/15 1345 12/29/15 1347  BP: 140/76   Pulse: 65   Weight:  269 lb 6.4 oz (122.199 kg)  SpO2: 94%    RR 14 Neck circumference : 18.5 inches ESS 16  Constitutional/General:  Pleasant, well-nourished, well-developed, not in any distress,  Comfortably seating.  Well kempt  Body mass index is 44.83 kg/(m^2).  HEENT: Pupils equal and reactive to light and accommodation. Anicteric sclerae. Normal nasal mucosa.   No oral  lesions,  mouth  clear,  oropharynx clear, no postnasal drip. (-) Oral thrush. No dental caries.  Airway - Mallampati class III  Neck: No masses. Midline trachea. No JVD, (-) LAD. (-) bruits appreciated.  Respiratory/Chest: Grossly normal chest. (-) deformity. (-) Accessory muscle use.  Symmetric expansion. (-) Tenderness on palpation.  Resonant on percussion.  Diminished BS on both lower lung zones. (-) wheezing, crackles, rhonchi (-) egophony  Cardiovascular: Regular rate and  rhythm, heart sounds normal, no murmur or gallops, no peripheral edema  Gastrointestinal:  Normal bowel sounds. Soft, non-tender. No hepatosplenomegaly.  (-) masses.   Musculoskeletal:  Normal muscle tone. Normal gait.   Extremities: Grossly normal. (-) clubbing, cyanosis.  (-) edema  Skin: (-) rash,lesions seen.   Neurological/Psychiatric : alert, oriented to time, place, person. Normal mood and affect            Assessment & Plan:  Obstructive sleep apnea Patient most likely has severe obstructive sleep apnea. No sleep study seen in the system. He is compliant. He is on his second machine. Current machine is more than 57 years old. Current machine is malfunctioning. He has hypersomnia, snoring, gasping, choking, was using the current CPAP machine. Patient has also gained 40 pounds the last 10 years. Plan: #1. Continue current CPAP machine. He is on 10 cm water. #2. Plan to get a split-night sleep study. I wanted to get a home study to facilitate but insurance would not cover it. Wait time for a lab study is around 4-6 weeks. We will contact DME to see whether we can get a new machine without getting the sleep study. He has a same insurance the last 10 years. #3. Advised on sleep hygiene. #4. Advised patient not to drive is sleepy. #5. Weight reduction. #6. Advice and getting frequent supplies, mass, tubings. Advised patient cleaning supplies frequently, at least once a week. #7. DME company is [employer] in [place], [place].  Severe obesity (BMI >= 40) Advised on weight reduction. Exercise as tolerated. Hopefully with treating sleep apnea, it will help with weight reduction.  Hypersomnia Hypersomnia through the years. Might be related to undertreated sleep apnea. Might have underlying obesity hypoventilation syndrome. Plan: #1. Patient will need a sleep study, we'll need a new CPAP machine. #2. Once compliance is Good and sleep apnea is treated, if hypersomnia is persistent, may need to do ABG to rule out obesity hypoventilation syndrome. #3. Advised not to drive her sleepy.  Exertional dyspnea Most likely related to weight. His chest  x-ray (March 2016)-no active infiltrate. Low lung volumes. We'll observe for now. May need repeat chest x-ray this year. May need 2-D echo.     Thank you very much for letting me participate in this patient's care. Please do not hesitate to give me a call if you have any questions or concerns regarding the treatment plan.   Patient will follow up with me in 4-6 weeks.     Elsie Saas A. Corrie Dandy, MD Pulmonary and [place] [phone], Fax: [phone]

## 2015-12-29 NOTE — Patient Instructions (Signed)
  You are diagnosed with Obstructive Sleep Apnea or OSA.   Cont using your cpap. We will schedule you to have a sleep study to determine the best pressure for sleep apnea.   Please make sure you use your CPAP device everytime you sleep.  We will monitor the usage of your machine per your insurance requirement.  Your insurance company may take the machine from you if you are not using it regularly.   Please clean the mask, tubings, filter, water reservoir with soapy water every week.  Please use distilled water for the water reservoir.   Please call the office or your machine provider (DME company) if you are having issues with the device.    Return to clinic in 6-8 weeks   J. Angelo A. Corrie Dandy, MD Pulmonary and [place] [phone], Fax: [phone]

## 2015-12-29 NOTE — Addendum Note (Signed)
Addended by: Beckie Busing on: 12/29/2015 02:45 PM   Modules accepted: Orders

## 2016-01-05 ENCOUNTER — Telehealth: Payer: Self-pay | Admitting: Pulmonary Disease

## 2016-01-05 DIAGNOSIS — G4733 Obstructive sleep apnea (adult) (pediatric): Secondary | ICD-10-CM

## 2016-01-05 NOTE — Telephone Encounter (Signed)
lmtcb X1 for pt  

## 2016-01-05 NOTE — Telephone Encounter (Signed)
Pt returning call again.Lance Frazier ° °

## 2016-01-05 NOTE — Telephone Encounter (Signed)
Spoke with pt. States that [place] is making him call his insurance company to get a medical necessity letter. They are wanting to him to make payments on his machine for the next 10 months. Pt does not want to do this, he wants to pay in full for the machine. [facility] will not let him do this without a letter of medical necessity. Pt wants his CPAP order sent to [facility] in [place], he does not want to deal with [facility]. Order will be placed for [facility] per the pt's request.

## 2016-01-12 ENCOUNTER — Telehealth: Payer: Self-pay | Admitting: Pulmonary Disease

## 2016-01-12 DIAGNOSIS — G4733 Obstructive sleep apnea (adult) (pediatric): Secondary | ICD-10-CM

## 2016-01-12 NOTE — Telephone Encounter (Signed)
Called spoke with pt. He is requesting his CPAP order be faxed to [email or web address]. He provided phone number of [phone]. I have placed new order for this. Nothing further needed

## 2016-01-12 NOTE — Telephone Encounter (Signed)
ATC received busy signal x 3 wcb

## 2016-01-12 NOTE — Telephone Encounter (Signed)
[name] called to state cpap appt missed. And he will cb to rschd that.  [phone]

## 2016-01-16 IMAGING — CR DG CHEST 2V
2 series · 2 of 2 positions shown · non-contrast
Comparison: 05/27/2012.

CLINICAL DATA: PLIF, single level. Preoperative chest radiograph.
L4-L5 laminectomy.

EXAM:
CHEST  2 VIEW

[w chest pa]
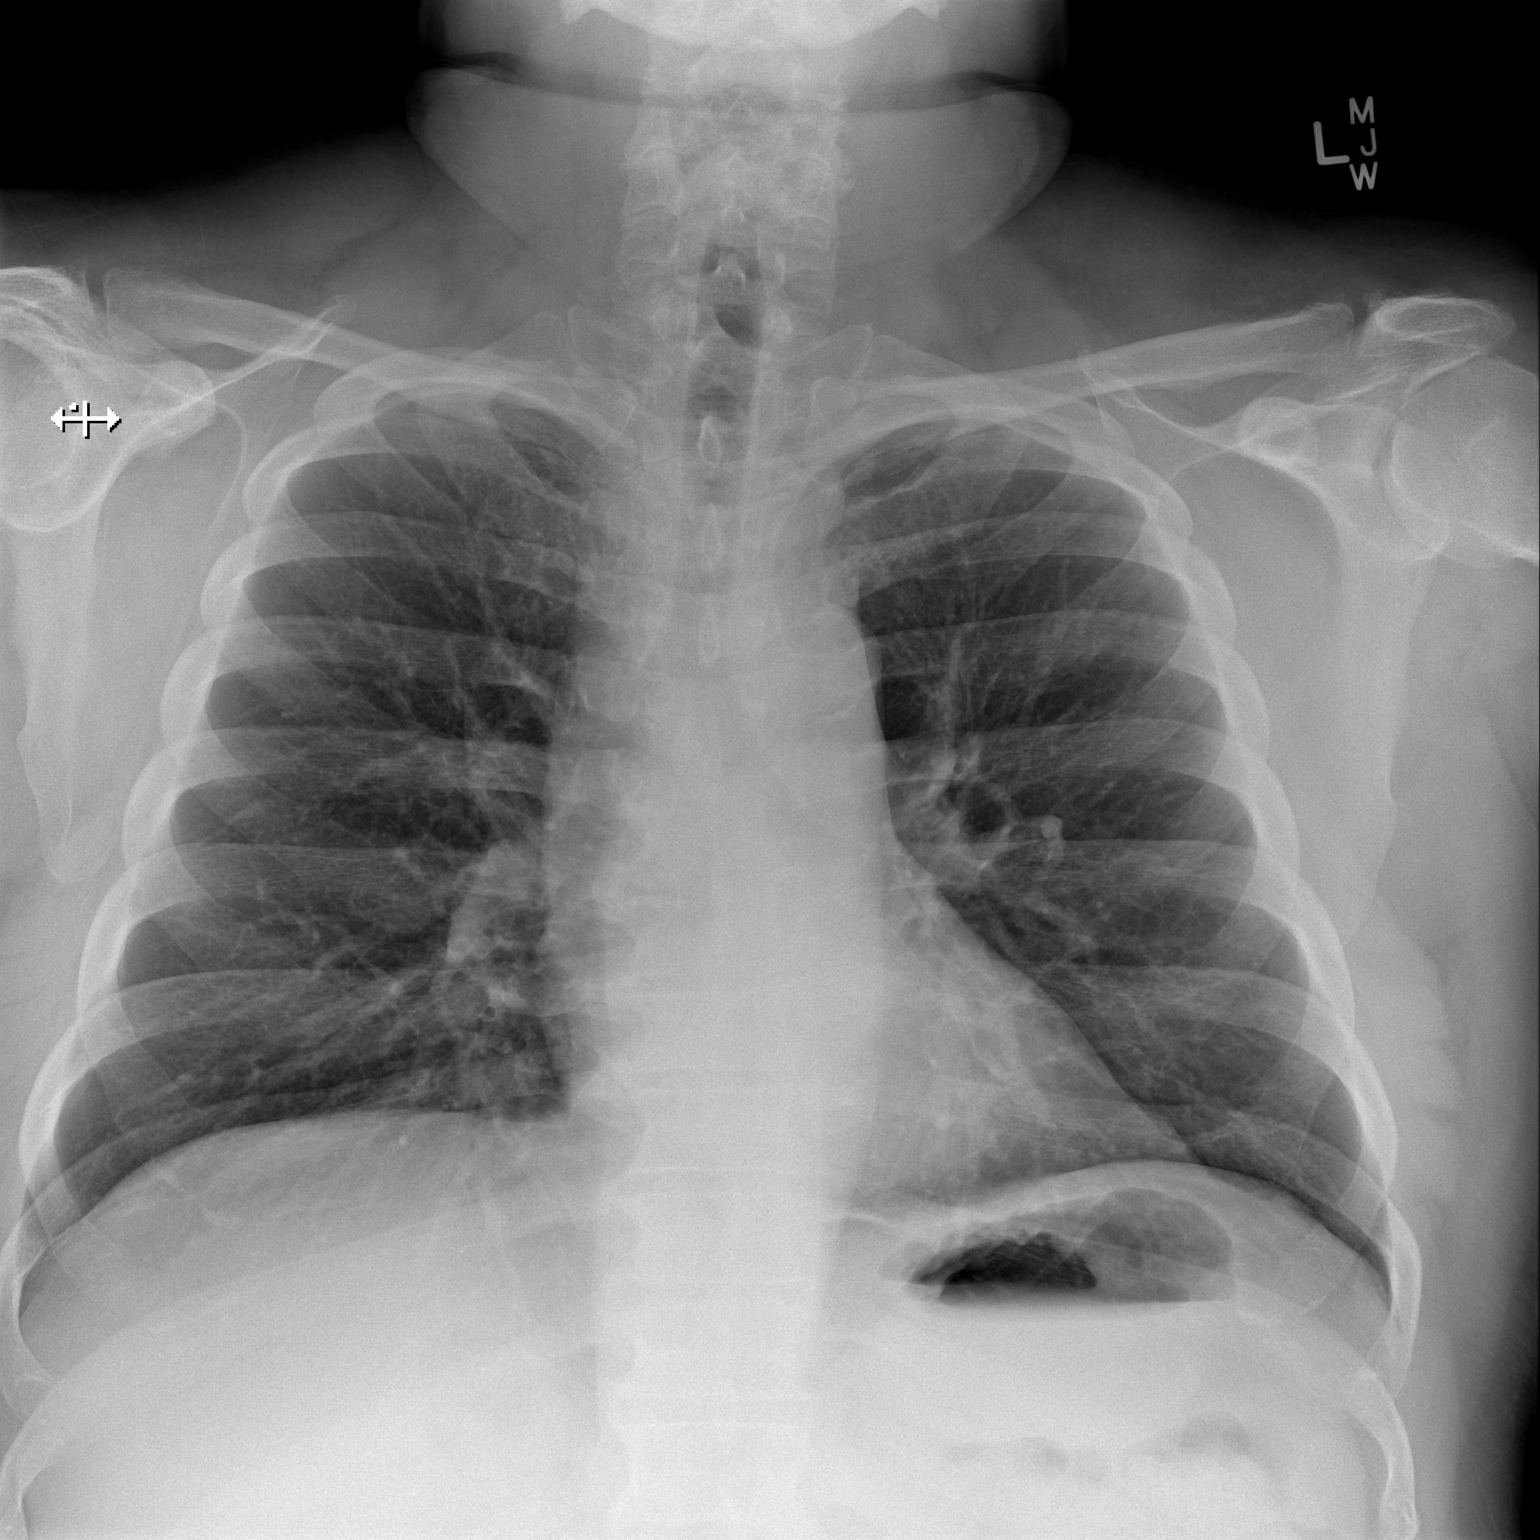

[w chest lat]
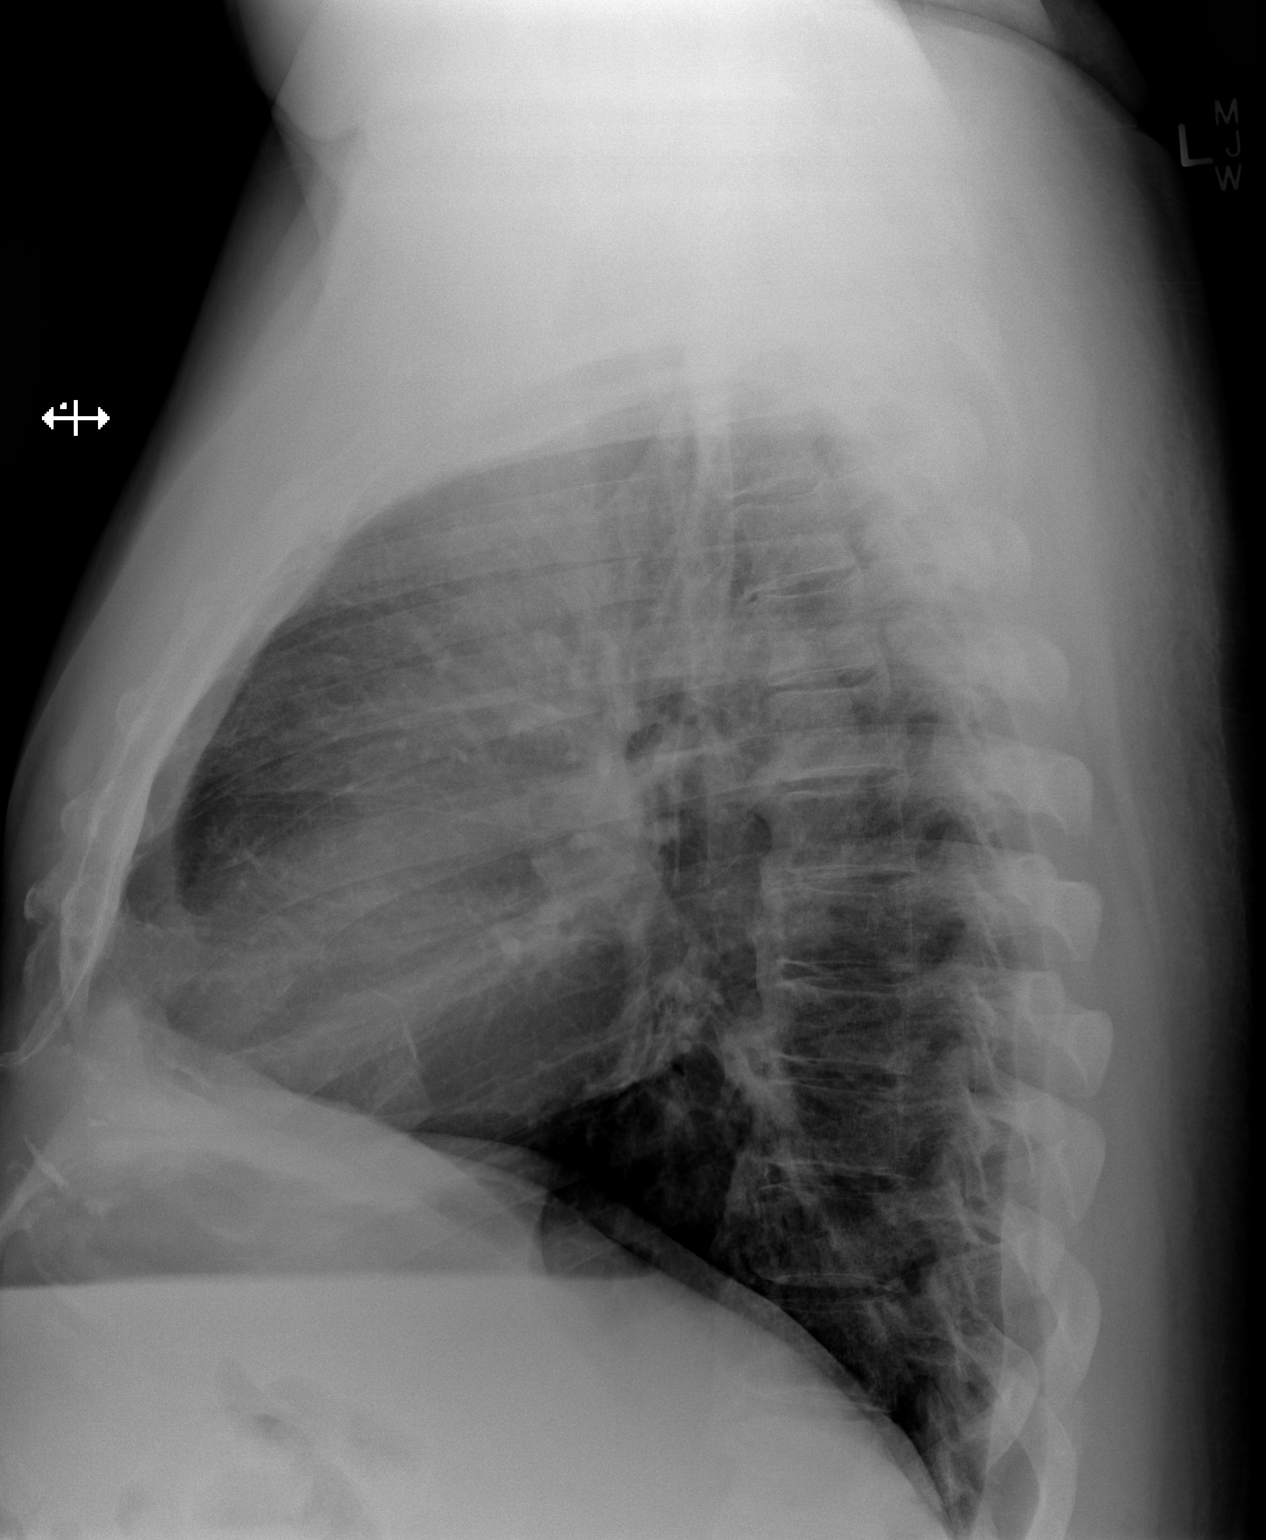

[2 of 2 positions shown; findings below may reference images not displayed]

FINDINGS: The heart size and mediastinal contours are within normal limits.
Both lungs are clear. The visualized skeletal structures are
unremarkable.
IMPRESSION: No active cardiopulmonary disease.

## 2016-02-27 ENCOUNTER — Other Ambulatory Visit: Payer: Self-pay | Admitting: Internal Medicine

## 2016-02-27 DIAGNOSIS — G4489 Other headache syndrome: Secondary | ICD-10-CM

## 2016-02-27 DIAGNOSIS — R61 Generalized hyperhidrosis: Secondary | ICD-10-CM

## 2016-03-03 ENCOUNTER — Ambulatory Visit
Admission: RE | Admit: 2016-03-03 | Discharge: 2016-03-03 | Disposition: A | Discharge status: Home / Self Care | Payer: BLUE CROSS/BLUE SHIELD | Source: Ambulatory Visit | Attending: Internal Medicine | Admitting: Internal Medicine

## 2016-03-03 DIAGNOSIS — R61 Generalized hyperhidrosis: Secondary | ICD-10-CM

## 2016-03-03 DIAGNOSIS — G4489 Other headache syndrome: Secondary | ICD-10-CM

## 2016-03-03 DIAGNOSIS — R51 Headache: Secondary | ICD-10-CM | POA: Diagnosis not present

## 2016-03-05 ENCOUNTER — Ambulatory Visit (HOSPITAL_BASED_OUTPATIENT_CLINIC_OR_DEPARTMENT_OTHER): Payer: BLUE CROSS/BLUE SHIELD

## 2016-03-05 ENCOUNTER — Ambulatory Visit (HOSPITAL_BASED_OUTPATIENT_CLINIC_OR_DEPARTMENT_OTHER): Payer: BLUE CROSS/BLUE SHIELD | Attending: Pulmonary Disease | Admitting: Pulmonary Disease

## 2016-03-05 DIAGNOSIS — R0683 Snoring: Secondary | ICD-10-CM | POA: Diagnosis not present

## 2016-03-05 DIAGNOSIS — G4733 Obstructive sleep apnea (adult) (pediatric): Secondary | ICD-10-CM | POA: Insufficient documentation

## 2016-03-14 ENCOUNTER — Telehealth: Payer: Self-pay | Admitting: Pulmonary Disease

## 2016-03-14 NOTE — Procedures (Signed)
  Patient Name: Lance Frazier, Lance Frazier Date: 03/05/2016   Gender: Male  D.O.B: [birth year]  Age (years): 54  Referring Provider: [facility]   Height (inches): 65  Interpreting Physician: [place]   Weight (lbs): 260  RPSGT: Madelon Lips   BMI: 81  MRN: [id number]  Neck Size: 18.50       CLINICAL INFORMATION  The patient is referred for a CPAP titration to treat sleep apnea. Date of NPSG, Split Night or HST:   SLEEP STUDY TECHNIQUE  As per the AASM Manual for the Scoring of Sleep and Associated Events v2.3 (April 2016) with a hypopnea requiring 4% desaturations.  The channels recorded and monitored were frontal, central and occipital EEG, electrooculogram (EOG), submentalis EMG (chin), nasal and oral airflow, thoracic and abdominal wall motion, anterior tibialis EMG, snore microphone, electrocardiogram, and pulse oximetry. Continuous positive airway pressure (CPAP) was initiated at the beginning of the study and titrated to treat sleep-disordered breathing.  MEDICATIONS  Medications taken by the patient : Medications per chart review. Medications administered by patient during sleep study : No sleep medicine administered.   TECHNICIAN COMMENTS  Comments added by technician: BATHROOM BREAK X1  Comments added by scorer: N/A   RESPIRATORY PARAMETERS  Optimal PAP Pressure (cm): 11 AHI at Optimal Pressure (/hr): 2.1  Overall Minimal O2 (%): 88.00 Supine % at Optimal Pressure (%): 100  Minimal O2 at Optimal Pressure (%): 90.0     SLEEP ARCHITECTURE  The study was initiated at 10:23:25 PM and ended at 4:32:05 AM.  Sleep onset time was 5.8 minutes and the sleep efficiency was 81.6%. The total sleep time was 300.9 minutes.  The patient spent 13.46% of the night in stage N1 sleep, 72.75% in stage N2 sleep, 0.83% in stage N3 and 12.96% in REM.Stage REM latency was 172.5 minutes  Wake after sleep onset was 62.0. Alpha intrusion was absent. Supine sleep was  84.38%.  CARDIAC DATA  The 2 lead EKG demonstrated sinus rhythm. The mean heart rate was 67.65 beats per minute. Other EKG findings include: None.   LEG MOVEMENT DATA  The total Periodic Limb Movements of Sleep (PLMS) were 0. The PLMS index was 0.00. A PLMS index of <15 is considered normal in adults.  IMPRESSIONS  The optimal PAP pressure was 11 cm of water. Central sleep apnea was not noted during this titration (CAI = 0.0/h). Mild oxygen desaturations were observed during this titration (min O2 = 88.00%). The patient snored with Loud snoring volume during this titration study. No cardiac abnormalities were observed during this study. Clinically significant periodic limb movements were not noted during this study. Arousals associated with PLMs were rare.   DIAGNOSIS  Obstructive Sleep Apnea (327.23 [G47.33 ICD-10])   RECOMMENDATIONS  Trial of CPAP therapy on 11 cm H2O with a Medium size Resmed Nasal Pillow Mask AirFit P10 mask and heated humidification. Avoid alcohol, sedatives and other CNS depressants that may worsen sleep apnea and disrupt normal sleep architecture. Sleep hygiene should be reviewed to assess factors that may improve sleep quality. Weight management and regular exercise should be initiated or continued. Return to [facility] for re-evaluation after 4 weeks of therapy airway pressurization therapy at centimeters of water pressure with a mask and heated humidification  J. Shirl Harris, MD 03/14/2016, 12:48 PM [facility] Pager [phone] After 3 pm or if no answer, call [phone]

## 2016-03-14 NOTE — Telephone Encounter (Signed)
  Sheena :  Please call the pt and tell the pt the cpap study showed 11 cm H2O will work for him.    Pt wants his CPAP order be faxed to [email or web address]. He provided phone number of [phone].   Please order autoCPAP 5-15 cm H2O. Patient will need a:  Medium size Resmed Nasal Pillow Mask AirFit P10 mask and heated humidification.Patient will need a 1 month download.   Patient needs to be seen 4-6 weeks after obtaining the cpap machine. Let me know if you receive this.   Thanks!   J. Shirl Harris, MD 03/14/2016, 12:54 PM

## 2016-03-16 NOTE — Telephone Encounter (Signed)
LMTCB

## 2016-03-19 ENCOUNTER — Encounter: Payer: Self-pay | Admitting: Pulmonary Disease

## 2016-03-19 ENCOUNTER — Ambulatory Visit (INDEPENDENT_AMBULATORY_CARE_PROVIDER_SITE_OTHER): Payer: BLUE CROSS/BLUE SHIELD | Admitting: Pulmonary Disease

## 2016-03-19 VITALS — BP 138/88 | HR 92 | Ht 65.0 in | Wt 265.0 lb

## 2016-03-19 DIAGNOSIS — R0609 Other forms of dyspnea: Secondary | ICD-10-CM | POA: Diagnosis not present

## 2016-03-19 DIAGNOSIS — G4733 Obstructive sleep apnea (adult) (pediatric): Secondary | ICD-10-CM

## 2016-03-19 DIAGNOSIS — R0600 Dyspnea, unspecified: Secondary | ICD-10-CM

## 2016-03-19 NOTE — Patient Instructions (Signed)
1. Continue using your cpap. 2. Please bring your SD card in the next week or so.   Return to clinic in 1 yr

## 2016-03-19 NOTE — Assessment & Plan Note (Addendum)
Patient most likely has severe obstructive sleep apnea. No sleep study seen in the system. He is compliant. He is on his second machine. Current machine is more than 57 years old. He was able to get a new CPAP machine (April 2017). He ended up paying out of pocket for the machine. He had a CPAP titration study which showed optimal at 11 cm water.  Plan : 1. Continue current CPAP machine. He ended up purchasing it off the Internet. He got it from [email or web address]. He did out of pocket and will be submitting it through his insurance. Not sure what the settings are. He had a CPAP titration study which showed he was optimal at 11 cm water. We need a download. Told patient to bring his SD card this week. 2. Sleep hygiene.

## 2016-03-19 NOTE — Assessment & Plan Note (Signed)
Most likely related to weight. His chest  x-ray (March 2016)-no active infiltrate. Low lung volumes. We'll observe for now. May need repeat chest x-ray this year. May need 2-D echo.

## 2016-03-19 NOTE — Assessment & Plan Note (Signed)
Advised on weight reduction. Exercise as tolerated. Hopefully with treating sleep apnea, it will help with weight reduction.

## 2016-03-19 NOTE — Progress Notes (Signed)
d  Subjective:    Patient ID: Lance Frazier, male    DOB: 1961-05-08, 55 y.o.   MRN: [id number]  HPI    This is the case of Lance Frazier, 55 y.o. Male, who was referred by Dr. Carloyn Manner fagan  in consultation regarding OSA.   He was diagnosed with OSA in 1989.  He had oral surgery -- tonsillectomy, uvulectomy, nasal surgery in 2004 in [place] by ENT as part of Rx for OSA. He had persistent OSA aftrer surgery.  He is on his 2nd machine. Machine is > 10 yrs ago. He is on 10 cm H2O.   DATA CPAP (03/14/16)  Optimal on 11 cm H2O  ROV (03/19/16) Patient returns to the office as follow-up on his CPAP machine. Since last seen, he had a CPAP dictation study which showed 11 cm water was optimal. Received a CPAP machine. Feels better using it. More energy. Less sleepiness.No issues with it. Feels benefit of cpap.    Review of Systems  Constitutional: Negative.   HENT: Negative.   Eyes: Negative.   Respiratory: Positive for apnea and choking. Negative for shortness of breath.   Cardiovascular: Negative.   Gastrointestinal: Negative.   Endocrine: Negative.   Genitourinary: Negative.   Musculoskeletal: Negative.   Skin: Negative.   Allergic/Immunologic: Negative.   Neurological: Negative.   Hematological: Negative.   Psychiatric/Behavioral: Negative.   All other systems reviewed and are negative.         Objective:   Physical Exam  Vitals:  Filed Vitals:   03/19/16 1348  BP: 138/88  Pulse: 92  Height: 5\' 5"  (1.651 m)  Weight: 265 lb (120.203 kg)  SpO2: 95%   Neck circumference : 18.5 inches  Constitutional/General:  Pleasant, well-nourished, well-developed, not in any distress,  Comfortably seating.  Well kempt  Body mass index is 44.1 kg/(m^2).  HEENT: Pupils equal and reactive to light and accommodation. Anicteric sclerae. Normal nasal mucosa.   No oral  lesions,  mouth clear,  oropharynx clear, no postnasal drip. (-) Oral thrush. No dental caries.  Airway -  Mallampati class III  Neck: No masses. Midline trachea. No JVD, (-) LAD. (-) bruits appreciated.  Respiratory/Chest: Grossly normal chest. (-) deformity. (-) Accessory muscle use.  Symmetric expansion. (-) Tenderness on palpation.  Resonant on percussion.  Diminished BS on both lower lung zones. (-) wheezing, crackles, rhonchi (-) egophony  Cardiovascular: Regular rate and  rhythm, heart sounds normal, no murmur or gallops, no peripheral edema  Gastrointestinal:  Normal bowel sounds. Soft, non-tender. No hepatosplenomegaly.  (-) masses.   Musculoskeletal:  Normal muscle tone. Normal gait.   Extremities: Grossly normal. (-) clubbing, cyanosis.  (-) edema  Skin: (-) rash,lesions seen.   Neurological/Psychiatric : alert, oriented to time, place, person. Normal mood and affect            Assessment & Plan:  Obstructive sleep apnea Patient most likely has severe obstructive sleep apnea. No sleep study seen in the system. He is compliant. He is on his second machine. Current machine is more than 57 years old. He was able to get a new CPAP machine (April 2017). He ended up paying out of pocket for the machine. He had a CPAP titration study which showed optimal at 11 cm water.  Plan : 1. Continue current CPAP machine. He ended up purchasing it off the Internet. He got it from [email or web address]. He did out of pocket and will be submitting it through his insurance. Not  sure what the settings are. He had a CPAP titration study which showed he was optimal at 11 cm water. We need a download. Told patient to bring his SD card this week. 2. Sleep hygiene.    Exertional dyspnea Most likely related to weight. His chest  x-ray (March 2016)-no active infiltrate. Low lung volumes. We'll observe for now. May need repeat chest x-ray this year. May need 2-D echo.    Severe obesity (BMI >= 40) Advised on weight reduction. Exercise as tolerated. Hopefully with treating sleep apnea, it will help  with weight reduction.     Return to clinic in 1 yr.       Elsie Saas A. Corrie Dandy, MD [facility] and [place] [phone], Fax: [phone]

## 2016-04-19 DIAGNOSIS — M542 Cervicalgia: Secondary | ICD-10-CM | POA: Diagnosis not present

## 2016-04-19 DIAGNOSIS — G44221 Chronic tension-type headache, intractable: Secondary | ICD-10-CM | POA: Diagnosis not present

## 2016-04-19 DIAGNOSIS — R51 Headache: Secondary | ICD-10-CM | POA: Diagnosis not present

## 2016-04-19 DIAGNOSIS — G4440 Drug-induced headache, not elsewhere classified, not intractable: Secondary | ICD-10-CM | POA: Diagnosis not present

## 2016-04-25 DIAGNOSIS — R51 Headache: Secondary | ICD-10-CM | POA: Diagnosis not present

## 2016-05-01 DIAGNOSIS — M542 Cervicalgia: Secondary | ICD-10-CM | POA: Diagnosis not present

## 2016-05-03 DIAGNOSIS — M542 Cervicalgia: Secondary | ICD-10-CM | POA: Diagnosis not present

## 2016-05-08 DIAGNOSIS — M542 Cervicalgia: Secondary | ICD-10-CM | POA: Diagnosis not present

## 2016-05-10 DIAGNOSIS — M542 Cervicalgia: Secondary | ICD-10-CM | POA: Diagnosis not present

## 2016-06-01 DIAGNOSIS — R51 Headache: Secondary | ICD-10-CM | POA: Diagnosis not present

## 2016-06-01 DIAGNOSIS — G44221 Chronic tension-type headache, intractable: Secondary | ICD-10-CM | POA: Diagnosis not present

## 2016-06-01 DIAGNOSIS — G4452 New daily persistent headache (NDPH): Secondary | ICD-10-CM | POA: Diagnosis not present

## 2016-06-08 DIAGNOSIS — Z79899 Other long term (current) drug therapy: Secondary | ICD-10-CM | POA: Diagnosis not present

## 2016-06-08 DIAGNOSIS — R51 Headache: Secondary | ICD-10-CM | POA: Diagnosis not present

## 2016-06-11 ENCOUNTER — Other Ambulatory Visit: Payer: Self-pay | Admitting: Neurology

## 2016-06-11 DIAGNOSIS — R51 Headache: Principal | ICD-10-CM

## 2016-06-11 DIAGNOSIS — R519 Headache, unspecified: Secondary | ICD-10-CM

## 2016-06-15 ENCOUNTER — Other Ambulatory Visit: Payer: Self-pay | Admitting: Neurology

## 2016-06-15 ENCOUNTER — Ambulatory Visit
Admission: RE | Admit: 2016-06-15 | Discharge: 2016-06-15 | Disposition: A | Discharge status: Home / Self Care | Payer: BLUE CROSS/BLUE SHIELD | Source: Ambulatory Visit | Attending: Neurology | Admitting: Neurology

## 2016-06-15 VITALS — BP 113/64 | HR 70

## 2016-06-15 DIAGNOSIS — R51 Headache: Secondary | ICD-10-CM | POA: Diagnosis not present

## 2016-06-15 DIAGNOSIS — R519 Headache, unspecified: Secondary | ICD-10-CM

## 2016-06-15 DIAGNOSIS — G4452 New daily persistent headache (NDPH): Secondary | ICD-10-CM | POA: Diagnosis not present

## 2016-06-15 LAB — GLUCOSE, CSF: Glucose, CSF: 96 mg/dL — ABNORMAL HIGH (ref 43–76)

## 2016-06-15 LAB — CSF CELL COUNT WITH DIFFERENTIAL
RBC Count, CSF: 1575 cells/uL — ABNORMAL HIGH (ref 0–10)
WBC CSF: 2 {cells}/uL (ref 0–5)

## 2016-06-15 LAB — PROTEIN, CSF: Total Protein, CSF: 73 mg/dL — ABNORMAL HIGH (ref 15–60)

## 2016-06-15 NOTE — Discharge Instructions (Signed)

## 2016-06-18 LAB — CSF CULTURE: GRAM STAIN: NONE SEEN

## 2016-06-18 LAB — CSF CULTURE W GRAM STAIN: Organism ID, Bacteria: NO GROWTH

## 2016-06-20 LAB — VDRL, CSF: VDRL Quant, CSF: NONREACTIVE

## 2016-06-20 LAB — CRYPTOCOCCAL AG, LTX SCR RFLX TITER: CRYPTOCOCCAL AG SCREEN: NOT DETECTED

## 2016-06-20 LAB — GRAM STAIN
Gram Stain: NONE SEEN
Gram Stain: NONE SEEN

## 2016-06-23 ENCOUNTER — Other Ambulatory Visit: Payer: Self-pay | Admitting: Neurology

## 2016-06-23 DIAGNOSIS — R519 Headache, unspecified: Secondary | ICD-10-CM

## 2016-06-23 DIAGNOSIS — R51 Headache: Principal | ICD-10-CM

## 2016-06-28 ENCOUNTER — Ambulatory Visit
Admission: RE | Admit: 2016-06-28 | Discharge: 2016-06-28 | Disposition: A | Discharge status: Home / Self Care | Payer: BLUE CROSS/BLUE SHIELD | Source: Ambulatory Visit | Attending: Neurology | Admitting: Neurology

## 2016-06-28 DIAGNOSIS — R51 Headache: Secondary | ICD-10-CM | POA: Diagnosis not present

## 2016-06-28 DIAGNOSIS — R519 Headache, unspecified: Secondary | ICD-10-CM

## 2016-06-28 DIAGNOSIS — R42 Dizziness and giddiness: Secondary | ICD-10-CM | POA: Diagnosis not present

## 2016-06-28 MED ORDER — IOPAMIDOL (ISOVUE-370) INJECTION 76%
80.0000 mL | Freq: Once | INTRAVENOUS | Status: AC | PRN
  Administered 2016-06-28: 80 mL via INTRAVENOUS

## 2016-07-17 DIAGNOSIS — G4489 Other headache syndrome: Secondary | ICD-10-CM | POA: Diagnosis not present

## 2016-07-17 DIAGNOSIS — R51 Headache: Secondary | ICD-10-CM | POA: Diagnosis not present

## 2016-07-17 DIAGNOSIS — M542 Cervicalgia: Secondary | ICD-10-CM | POA: Diagnosis not present

## 2016-07-17 DIAGNOSIS — G44221 Chronic tension-type headache, intractable: Secondary | ICD-10-CM | POA: Diagnosis not present

## 2016-07-18 DIAGNOSIS — M542 Cervicalgia: Secondary | ICD-10-CM | POA: Diagnosis not present

## 2016-07-18 DIAGNOSIS — G44219 Episodic tension-type headache, not intractable: Secondary | ICD-10-CM | POA: Diagnosis not present

## 2016-07-18 DIAGNOSIS — M9902 Segmental and somatic dysfunction of thoracic region: Secondary | ICD-10-CM | POA: Diagnosis not present

## 2016-07-18 DIAGNOSIS — M9901 Segmental and somatic dysfunction of cervical region: Secondary | ICD-10-CM | POA: Diagnosis not present

## 2016-07-26 ENCOUNTER — Other Ambulatory Visit: Payer: Self-pay | Admitting: Neurology

## 2016-07-26 DIAGNOSIS — M542 Cervicalgia: Secondary | ICD-10-CM

## 2016-08-02 ENCOUNTER — Ambulatory Visit
Admission: RE | Admit: 2016-08-02 | Discharge: 2016-08-02 | Disposition: A | Discharge status: Home / Self Care | Payer: BLUE CROSS/BLUE SHIELD | Source: Ambulatory Visit | Attending: Neurology | Admitting: Neurology

## 2016-08-02 DIAGNOSIS — M542 Cervicalgia: Secondary | ICD-10-CM

## 2016-08-02 DIAGNOSIS — M4802 Spinal stenosis, cervical region: Secondary | ICD-10-CM | POA: Diagnosis not present

## 2016-08-09 ENCOUNTER — Other Ambulatory Visit: Payer: Self-pay | Admitting: Neurology

## 2016-08-09 DIAGNOSIS — M542 Cervicalgia: Secondary | ICD-10-CM

## 2016-09-18 DIAGNOSIS — M542 Cervicalgia: Secondary | ICD-10-CM | POA: Diagnosis not present

## 2016-09-18 DIAGNOSIS — R030 Elevated blood-pressure reading, without diagnosis of hypertension: Secondary | ICD-10-CM | POA: Diagnosis not present

## 2016-09-18 DIAGNOSIS — M4316 Spondylolisthesis, lumbar region: Secondary | ICD-10-CM | POA: Diagnosis not present

## 2016-09-18 DIAGNOSIS — G4489 Other headache syndrome: Secondary | ICD-10-CM | POA: Diagnosis not present

## 2016-09-18 DIAGNOSIS — I10 Essential (primary) hypertension: Secondary | ICD-10-CM | POA: Diagnosis not present

## 2016-09-18 DIAGNOSIS — M47812 Spondylosis without myelopathy or radiculopathy, cervical region: Secondary | ICD-10-CM | POA: Diagnosis not present

## 2016-09-18 DIAGNOSIS — M545 Low back pain: Secondary | ICD-10-CM | POA: Diagnosis not present

## 2016-09-28 DIAGNOSIS — E119 Type 2 diabetes mellitus without complications: Secondary | ICD-10-CM | POA: Diagnosis not present

## 2016-10-09 DIAGNOSIS — E1100 Type 2 diabetes mellitus with hyperosmolarity without nonketotic hyperglycemic-hyperosmolar coma (NKHHC): Secondary | ICD-10-CM | POA: Diagnosis not present

## 2016-10-09 DIAGNOSIS — I10 Essential (primary) hypertension: Secondary | ICD-10-CM | POA: Diagnosis not present

## 2016-10-10 ENCOUNTER — Ambulatory Visit (HOSPITAL_COMMUNITY): Payer: BLUE CROSS/BLUE SHIELD | Admitting: Physical Therapy

## 2016-10-17 ENCOUNTER — Telehealth (HOSPITAL_COMMUNITY): Payer: Self-pay | Admitting: Physical Therapy

## 2016-10-17 ENCOUNTER — Ambulatory Visit (HOSPITAL_COMMUNITY): Payer: BLUE CROSS/BLUE SHIELD | Admitting: Physical Therapy

## 2016-10-17 NOTE — Telephone Encounter (Signed)
Patient called clinic stating that he is not feeling good- he is sick and needs to cancel his evaluation today. We will have front desk staff call him back to reschedule.  Deniece Ree PT, DPT [phone]

## 2016-11-21 ENCOUNTER — Ambulatory Visit (HOSPITAL_COMMUNITY): Payer: BLUE CROSS/BLUE SHIELD | Attending: Neurosurgery

## 2016-11-21 DIAGNOSIS — I10 Essential (primary) hypertension: Secondary | ICD-10-CM | POA: Diagnosis not present

## 2016-11-21 DIAGNOSIS — Z6841 Body Mass Index (BMI) 40.0 and over, adult: Secondary | ICD-10-CM | POA: Diagnosis not present

## 2016-11-21 DIAGNOSIS — R51 Headache: Secondary | ICD-10-CM | POA: Diagnosis not present

## 2016-11-21 DIAGNOSIS — M47812 Spondylosis without myelopathy or radiculopathy, cervical region: Secondary | ICD-10-CM | POA: Diagnosis not present

## 2016-11-26 DIAGNOSIS — G4489 Other headache syndrome: Secondary | ICD-10-CM | POA: Diagnosis not present

## 2016-11-26 DIAGNOSIS — M542 Cervicalgia: Secondary | ICD-10-CM | POA: Diagnosis not present

## 2016-11-26 DIAGNOSIS — H538 Other visual disturbances: Secondary | ICD-10-CM | POA: Diagnosis not present

## 2016-11-26 DIAGNOSIS — M62838 Other muscle spasm: Secondary | ICD-10-CM | POA: Diagnosis not present

## 2016-12-13 DIAGNOSIS — M9981 Other biomechanical lesions of cervical region: Secondary | ICD-10-CM | POA: Diagnosis not present

## 2016-12-13 DIAGNOSIS — M47812 Spondylosis without myelopathy or radiculopathy, cervical region: Secondary | ICD-10-CM | POA: Diagnosis not present

## 2016-12-13 DIAGNOSIS — M542 Cervicalgia: Secondary | ICD-10-CM | POA: Diagnosis not present

## 2016-12-13 DIAGNOSIS — M5030 Other cervical disc degeneration, unspecified cervical region: Secondary | ICD-10-CM | POA: Diagnosis not present

## 2016-12-19 DIAGNOSIS — M47812 Spondylosis without myelopathy or radiculopathy, cervical region: Secondary | ICD-10-CM | POA: Diagnosis not present

## 2016-12-24 DIAGNOSIS — R972 Elevated prostate specific antigen [PSA]: Secondary | ICD-10-CM | POA: Diagnosis not present

## 2016-12-24 DIAGNOSIS — N401 Enlarged prostate with lower urinary tract symptoms: Secondary | ICD-10-CM | POA: Diagnosis not present

## 2016-12-24 DIAGNOSIS — N138 Other obstructive and reflux uropathy: Secondary | ICD-10-CM | POA: Diagnosis not present

## 2016-12-31 DIAGNOSIS — M50322 Other cervical disc degeneration at C5-C6 level: Secondary | ICD-10-CM | POA: Diagnosis not present

## 2016-12-31 DIAGNOSIS — M50323 Other cervical disc degeneration at C6-C7 level: Secondary | ICD-10-CM | POA: Diagnosis not present

## 2016-12-31 DIAGNOSIS — R293 Abnormal posture: Secondary | ICD-10-CM | POA: Diagnosis not present

## 2016-12-31 DIAGNOSIS — M542 Cervicalgia: Secondary | ICD-10-CM | POA: Diagnosis not present

## 2017-01-07 DIAGNOSIS — R293 Abnormal posture: Secondary | ICD-10-CM | POA: Diagnosis not present

## 2017-01-07 DIAGNOSIS — R972 Elevated prostate specific antigen [PSA]: Secondary | ICD-10-CM | POA: Diagnosis not present

## 2017-01-07 DIAGNOSIS — M50322 Other cervical disc degeneration at C5-C6 level: Secondary | ICD-10-CM | POA: Diagnosis not present

## 2017-01-07 DIAGNOSIS — M542 Cervicalgia: Secondary | ICD-10-CM | POA: Diagnosis not present

## 2017-01-07 DIAGNOSIS — M50323 Other cervical disc degeneration at C6-C7 level: Secondary | ICD-10-CM | POA: Diagnosis not present

## 2017-01-08 DIAGNOSIS — M9981 Other biomechanical lesions of cervical region: Secondary | ICD-10-CM | POA: Diagnosis not present

## 2017-01-10 DIAGNOSIS — Z125 Encounter for screening for malignant neoplasm of prostate: Secondary | ICD-10-CM | POA: Diagnosis not present

## 2017-01-10 DIAGNOSIS — M4310 Spondylolisthesis, site unspecified: Secondary | ICD-10-CM | POA: Diagnosis not present

## 2017-01-10 DIAGNOSIS — Z79899 Other long term (current) drug therapy: Secondary | ICD-10-CM | POA: Diagnosis not present

## 2017-01-10 DIAGNOSIS — E119 Type 2 diabetes mellitus without complications: Secondary | ICD-10-CM | POA: Diagnosis not present

## 2017-01-10 DIAGNOSIS — I10 Essential (primary) hypertension: Secondary | ICD-10-CM | POA: Diagnosis not present

## 2017-01-11 DIAGNOSIS — M542 Cervicalgia: Secondary | ICD-10-CM | POA: Diagnosis not present

## 2017-01-11 DIAGNOSIS — M50322 Other cervical disc degeneration at C5-C6 level: Secondary | ICD-10-CM | POA: Diagnosis not present

## 2017-01-11 DIAGNOSIS — R293 Abnormal posture: Secondary | ICD-10-CM | POA: Diagnosis not present

## 2017-01-11 DIAGNOSIS — M50323 Other cervical disc degeneration at C6-C7 level: Secondary | ICD-10-CM | POA: Diagnosis not present

## 2017-01-14 DIAGNOSIS — M542 Cervicalgia: Secondary | ICD-10-CM | POA: Diagnosis not present

## 2017-01-14 DIAGNOSIS — M50322 Other cervical disc degeneration at C5-C6 level: Secondary | ICD-10-CM | POA: Diagnosis not present

## 2017-01-14 DIAGNOSIS — M50323 Other cervical disc degeneration at C6-C7 level: Secondary | ICD-10-CM | POA: Diagnosis not present

## 2017-01-14 DIAGNOSIS — R293 Abnormal posture: Secondary | ICD-10-CM | POA: Diagnosis not present

## 2017-01-15 DIAGNOSIS — E1149 Type 2 diabetes mellitus with other diabetic neurological complication: Secondary | ICD-10-CM | POA: Diagnosis not present

## 2017-01-15 DIAGNOSIS — E785 Hyperlipidemia, unspecified: Secondary | ICD-10-CM | POA: Diagnosis not present

## 2017-01-15 DIAGNOSIS — I10 Essential (primary) hypertension: Secondary | ICD-10-CM | POA: Diagnosis not present

## 2017-01-15 DIAGNOSIS — Z6841 Body Mass Index (BMI) 40.0 and over, adult: Secondary | ICD-10-CM | POA: Diagnosis not present

## 2017-01-16 DIAGNOSIS — R293 Abnormal posture: Secondary | ICD-10-CM | POA: Diagnosis not present

## 2017-01-16 DIAGNOSIS — M542 Cervicalgia: Secondary | ICD-10-CM | POA: Diagnosis not present

## 2017-01-16 DIAGNOSIS — M50322 Other cervical disc degeneration at C5-C6 level: Secondary | ICD-10-CM | POA: Diagnosis not present

## 2017-01-16 DIAGNOSIS — M50323 Other cervical disc degeneration at C6-C7 level: Secondary | ICD-10-CM | POA: Diagnosis not present

## 2017-01-21 DIAGNOSIS — M50322 Other cervical disc degeneration at C5-C6 level: Secondary | ICD-10-CM | POA: Diagnosis not present

## 2017-01-21 DIAGNOSIS — R293 Abnormal posture: Secondary | ICD-10-CM | POA: Diagnosis not present

## 2017-01-21 DIAGNOSIS — M50323 Other cervical disc degeneration at C6-C7 level: Secondary | ICD-10-CM | POA: Diagnosis not present

## 2017-01-21 DIAGNOSIS — M542 Cervicalgia: Secondary | ICD-10-CM | POA: Diagnosis not present

## 2017-01-23 DIAGNOSIS — M50323 Other cervical disc degeneration at C6-C7 level: Secondary | ICD-10-CM | POA: Diagnosis not present

## 2017-01-23 DIAGNOSIS — M50322 Other cervical disc degeneration at C5-C6 level: Secondary | ICD-10-CM | POA: Diagnosis not present

## 2017-01-23 DIAGNOSIS — M542 Cervicalgia: Secondary | ICD-10-CM | POA: Diagnosis not present

## 2017-01-23 DIAGNOSIS — R293 Abnormal posture: Secondary | ICD-10-CM | POA: Diagnosis not present

## 2017-01-24 DIAGNOSIS — G4489 Other headache syndrome: Secondary | ICD-10-CM | POA: Diagnosis not present

## 2017-01-24 DIAGNOSIS — M47812 Spondylosis without myelopathy or radiculopathy, cervical region: Secondary | ICD-10-CM | POA: Diagnosis not present

## 2017-01-24 DIAGNOSIS — M542 Cervicalgia: Secondary | ICD-10-CM | POA: Diagnosis not present

## 2017-01-31 DIAGNOSIS — M542 Cervicalgia: Secondary | ICD-10-CM | POA: Diagnosis not present

## 2017-01-31 DIAGNOSIS — R293 Abnormal posture: Secondary | ICD-10-CM | POA: Diagnosis not present

## 2017-01-31 DIAGNOSIS — M50323 Other cervical disc degeneration at C6-C7 level: Secondary | ICD-10-CM | POA: Diagnosis not present

## 2017-01-31 DIAGNOSIS — M50322 Other cervical disc degeneration at C5-C6 level: Secondary | ICD-10-CM | POA: Diagnosis not present

## 2017-02-04 DIAGNOSIS — M50323 Other cervical disc degeneration at C6-C7 level: Secondary | ICD-10-CM | POA: Diagnosis not present

## 2017-02-04 DIAGNOSIS — M50322 Other cervical disc degeneration at C5-C6 level: Secondary | ICD-10-CM | POA: Diagnosis not present

## 2017-02-04 DIAGNOSIS — M542 Cervicalgia: Secondary | ICD-10-CM | POA: Diagnosis not present

## 2017-02-04 DIAGNOSIS — R293 Abnormal posture: Secondary | ICD-10-CM | POA: Diagnosis not present

## 2017-02-06 DIAGNOSIS — R293 Abnormal posture: Secondary | ICD-10-CM | POA: Diagnosis not present

## 2017-02-06 DIAGNOSIS — M542 Cervicalgia: Secondary | ICD-10-CM | POA: Diagnosis not present

## 2017-02-06 DIAGNOSIS — M50322 Other cervical disc degeneration at C5-C6 level: Secondary | ICD-10-CM | POA: Diagnosis not present

## 2017-02-06 DIAGNOSIS — M50323 Other cervical disc degeneration at C6-C7 level: Secondary | ICD-10-CM | POA: Diagnosis not present

## 2017-02-11 DIAGNOSIS — M50323 Other cervical disc degeneration at C6-C7 level: Secondary | ICD-10-CM | POA: Diagnosis not present

## 2017-02-11 DIAGNOSIS — M50322 Other cervical disc degeneration at C5-C6 level: Secondary | ICD-10-CM | POA: Diagnosis not present

## 2017-02-11 DIAGNOSIS — M542 Cervicalgia: Secondary | ICD-10-CM | POA: Diagnosis not present

## 2017-02-11 DIAGNOSIS — R293 Abnormal posture: Secondary | ICD-10-CM | POA: Diagnosis not present

## 2017-02-13 DIAGNOSIS — M50323 Other cervical disc degeneration at C6-C7 level: Secondary | ICD-10-CM | POA: Diagnosis not present

## 2017-02-13 DIAGNOSIS — M50322 Other cervical disc degeneration at C5-C6 level: Secondary | ICD-10-CM | POA: Diagnosis not present

## 2017-02-13 DIAGNOSIS — R293 Abnormal posture: Secondary | ICD-10-CM | POA: Diagnosis not present

## 2017-02-13 DIAGNOSIS — M542 Cervicalgia: Secondary | ICD-10-CM | POA: Diagnosis not present

## 2017-02-18 DIAGNOSIS — M50322 Other cervical disc degeneration at C5-C6 level: Secondary | ICD-10-CM | POA: Diagnosis not present

## 2017-02-18 DIAGNOSIS — M542 Cervicalgia: Secondary | ICD-10-CM | POA: Diagnosis not present

## 2017-02-18 DIAGNOSIS — R293 Abnormal posture: Secondary | ICD-10-CM | POA: Diagnosis not present

## 2017-02-18 DIAGNOSIS — M50323 Other cervical disc degeneration at C6-C7 level: Secondary | ICD-10-CM | POA: Diagnosis not present

## 2017-02-21 DIAGNOSIS — M542 Cervicalgia: Secondary | ICD-10-CM | POA: Diagnosis not present

## 2017-02-21 DIAGNOSIS — R293 Abnormal posture: Secondary | ICD-10-CM | POA: Diagnosis not present

## 2017-02-21 DIAGNOSIS — M50323 Other cervical disc degeneration at C6-C7 level: Secondary | ICD-10-CM | POA: Diagnosis not present

## 2017-02-21 DIAGNOSIS — M50322 Other cervical disc degeneration at C5-C6 level: Secondary | ICD-10-CM | POA: Diagnosis not present

## 2017-02-26 DIAGNOSIS — M50323 Other cervical disc degeneration at C6-C7 level: Secondary | ICD-10-CM | POA: Diagnosis not present

## 2017-02-26 DIAGNOSIS — R293 Abnormal posture: Secondary | ICD-10-CM | POA: Diagnosis not present

## 2017-02-26 DIAGNOSIS — M542 Cervicalgia: Secondary | ICD-10-CM | POA: Diagnosis not present

## 2017-02-26 DIAGNOSIS — M50322 Other cervical disc degeneration at C5-C6 level: Secondary | ICD-10-CM | POA: Diagnosis not present

## 2017-02-28 DIAGNOSIS — M542 Cervicalgia: Secondary | ICD-10-CM | POA: Diagnosis not present

## 2017-02-28 DIAGNOSIS — M50322 Other cervical disc degeneration at C5-C6 level: Secondary | ICD-10-CM | POA: Diagnosis not present

## 2017-02-28 DIAGNOSIS — R293 Abnormal posture: Secondary | ICD-10-CM | POA: Diagnosis not present

## 2017-02-28 DIAGNOSIS — M50323 Other cervical disc degeneration at C6-C7 level: Secondary | ICD-10-CM | POA: Diagnosis not present

## 2017-03-07 DIAGNOSIS — M542 Cervicalgia: Secondary | ICD-10-CM | POA: Diagnosis not present

## 2017-03-07 DIAGNOSIS — M50323 Other cervical disc degeneration at C6-C7 level: Secondary | ICD-10-CM | POA: Diagnosis not present

## 2017-03-07 DIAGNOSIS — R293 Abnormal posture: Secondary | ICD-10-CM | POA: Diagnosis not present

## 2017-03-07 DIAGNOSIS — M50322 Other cervical disc degeneration at C5-C6 level: Secondary | ICD-10-CM | POA: Diagnosis not present

## 2017-03-11 DIAGNOSIS — M542 Cervicalgia: Secondary | ICD-10-CM | POA: Diagnosis not present

## 2017-03-11 DIAGNOSIS — M50323 Other cervical disc degeneration at C6-C7 level: Secondary | ICD-10-CM | POA: Diagnosis not present

## 2017-03-11 DIAGNOSIS — R293 Abnormal posture: Secondary | ICD-10-CM | POA: Diagnosis not present

## 2017-03-11 DIAGNOSIS — M50322 Other cervical disc degeneration at C5-C6 level: Secondary | ICD-10-CM | POA: Diagnosis not present

## 2017-03-14 DIAGNOSIS — R293 Abnormal posture: Secondary | ICD-10-CM | POA: Diagnosis not present

## 2017-03-14 DIAGNOSIS — M50322 Other cervical disc degeneration at C5-C6 level: Secondary | ICD-10-CM | POA: Diagnosis not present

## 2017-03-14 DIAGNOSIS — M50323 Other cervical disc degeneration at C6-C7 level: Secondary | ICD-10-CM | POA: Diagnosis not present

## 2017-03-14 DIAGNOSIS — M542 Cervicalgia: Secondary | ICD-10-CM | POA: Diagnosis not present

## 2017-03-25 DIAGNOSIS — M542 Cervicalgia: Secondary | ICD-10-CM | POA: Diagnosis not present

## 2017-03-25 DIAGNOSIS — R293 Abnormal posture: Secondary | ICD-10-CM | POA: Diagnosis not present

## 2017-03-25 DIAGNOSIS — M50322 Other cervical disc degeneration at C5-C6 level: Secondary | ICD-10-CM | POA: Diagnosis not present

## 2017-03-25 DIAGNOSIS — M50323 Other cervical disc degeneration at C6-C7 level: Secondary | ICD-10-CM | POA: Diagnosis not present

## 2017-03-28 DIAGNOSIS — M542 Cervicalgia: Secondary | ICD-10-CM | POA: Diagnosis not present

## 2017-03-28 DIAGNOSIS — G4489 Other headache syndrome: Secondary | ICD-10-CM | POA: Diagnosis not present

## 2017-04-01 DIAGNOSIS — M50322 Other cervical disc degeneration at C5-C6 level: Secondary | ICD-10-CM | POA: Diagnosis not present

## 2017-04-01 DIAGNOSIS — M50323 Other cervical disc degeneration at C6-C7 level: Secondary | ICD-10-CM | POA: Diagnosis not present

## 2017-04-01 DIAGNOSIS — M542 Cervicalgia: Secondary | ICD-10-CM | POA: Diagnosis not present

## 2017-04-01 DIAGNOSIS — R293 Abnormal posture: Secondary | ICD-10-CM | POA: Diagnosis not present

## 2017-04-16 DIAGNOSIS — R293 Abnormal posture: Secondary | ICD-10-CM | POA: Diagnosis not present

## 2017-04-16 DIAGNOSIS — M50323 Other cervical disc degeneration at C6-C7 level: Secondary | ICD-10-CM | POA: Diagnosis not present

## 2017-04-16 DIAGNOSIS — M50322 Other cervical disc degeneration at C5-C6 level: Secondary | ICD-10-CM | POA: Diagnosis not present

## 2017-04-16 DIAGNOSIS — M542 Cervicalgia: Secondary | ICD-10-CM | POA: Diagnosis not present

## 2017-04-22 DIAGNOSIS — M50323 Other cervical disc degeneration at C6-C7 level: Secondary | ICD-10-CM | POA: Diagnosis not present

## 2017-04-22 DIAGNOSIS — M50322 Other cervical disc degeneration at C5-C6 level: Secondary | ICD-10-CM | POA: Diagnosis not present

## 2017-04-22 DIAGNOSIS — R293 Abnormal posture: Secondary | ICD-10-CM | POA: Diagnosis not present

## 2017-04-22 DIAGNOSIS — M542 Cervicalgia: Secondary | ICD-10-CM | POA: Diagnosis not present

## 2017-05-02 DIAGNOSIS — R293 Abnormal posture: Secondary | ICD-10-CM | POA: Diagnosis not present

## 2017-05-02 DIAGNOSIS — M542 Cervicalgia: Secondary | ICD-10-CM | POA: Diagnosis not present

## 2017-05-02 DIAGNOSIS — M50322 Other cervical disc degeneration at C5-C6 level: Secondary | ICD-10-CM | POA: Diagnosis not present

## 2017-05-02 DIAGNOSIS — M50323 Other cervical disc degeneration at C6-C7 level: Secondary | ICD-10-CM | POA: Diagnosis not present

## 2017-05-06 DIAGNOSIS — R293 Abnormal posture: Secondary | ICD-10-CM | POA: Diagnosis not present

## 2017-05-06 DIAGNOSIS — M50323 Other cervical disc degeneration at C6-C7 level: Secondary | ICD-10-CM | POA: Diagnosis not present

## 2017-05-06 DIAGNOSIS — M50322 Other cervical disc degeneration at C5-C6 level: Secondary | ICD-10-CM | POA: Diagnosis not present

## 2017-05-06 DIAGNOSIS — M542 Cervicalgia: Secondary | ICD-10-CM | POA: Diagnosis not present

## 2017-05-09 DIAGNOSIS — E119 Type 2 diabetes mellitus without complications: Secondary | ICD-10-CM | POA: Diagnosis not present

## 2017-05-13 DIAGNOSIS — M50322 Other cervical disc degeneration at C5-C6 level: Secondary | ICD-10-CM | POA: Diagnosis not present

## 2017-05-13 DIAGNOSIS — R293 Abnormal posture: Secondary | ICD-10-CM | POA: Diagnosis not present

## 2017-05-13 DIAGNOSIS — M542 Cervicalgia: Secondary | ICD-10-CM | POA: Diagnosis not present

## 2017-05-13 DIAGNOSIS — M50323 Other cervical disc degeneration at C6-C7 level: Secondary | ICD-10-CM | POA: Diagnosis not present

## 2017-05-20 DIAGNOSIS — R293 Abnormal posture: Secondary | ICD-10-CM | POA: Diagnosis not present

## 2017-05-20 DIAGNOSIS — M50323 Other cervical disc degeneration at C6-C7 level: Secondary | ICD-10-CM | POA: Diagnosis not present

## 2017-05-20 DIAGNOSIS — M542 Cervicalgia: Secondary | ICD-10-CM | POA: Diagnosis not present

## 2017-05-20 DIAGNOSIS — M50322 Other cervical disc degeneration at C5-C6 level: Secondary | ICD-10-CM | POA: Diagnosis not present

## 2017-05-27 DIAGNOSIS — R293 Abnormal posture: Secondary | ICD-10-CM | POA: Diagnosis not present

## 2017-05-27 DIAGNOSIS — M542 Cervicalgia: Secondary | ICD-10-CM | POA: Diagnosis not present

## 2017-05-27 DIAGNOSIS — M50322 Other cervical disc degeneration at C5-C6 level: Secondary | ICD-10-CM | POA: Diagnosis not present

## 2017-05-27 DIAGNOSIS — M50323 Other cervical disc degeneration at C6-C7 level: Secondary | ICD-10-CM | POA: Diagnosis not present

## 2017-05-30 DIAGNOSIS — E119 Type 2 diabetes mellitus without complications: Secondary | ICD-10-CM | POA: Diagnosis not present

## 2017-05-30 DIAGNOSIS — Z6841 Body Mass Index (BMI) 40.0 and over, adult: Secondary | ICD-10-CM | POA: Diagnosis not present

## 2017-06-04 DIAGNOSIS — M542 Cervicalgia: Secondary | ICD-10-CM | POA: Diagnosis not present

## 2017-06-04 DIAGNOSIS — Z6841 Body Mass Index (BMI) 40.0 and over, adult: Secondary | ICD-10-CM | POA: Diagnosis not present

## 2017-06-04 DIAGNOSIS — I10 Essential (primary) hypertension: Secondary | ICD-10-CM | POA: Diagnosis not present

## 2017-06-10 DIAGNOSIS — R293 Abnormal posture: Secondary | ICD-10-CM | POA: Diagnosis not present

## 2017-06-10 DIAGNOSIS — M50322 Other cervical disc degeneration at C5-C6 level: Secondary | ICD-10-CM | POA: Diagnosis not present

## 2017-06-10 DIAGNOSIS — M542 Cervicalgia: Secondary | ICD-10-CM | POA: Diagnosis not present

## 2017-06-10 DIAGNOSIS — M50323 Other cervical disc degeneration at C6-C7 level: Secondary | ICD-10-CM | POA: Diagnosis not present

## 2017-07-19 IMAGING — MR MR HEAD W/O CM
11 series · 48 of 48 positions shown · non-contrast
Comparison: MR brain 07/15/2006.

CLINICAL DATA: Ringing in ears.  Headaches.

EXAM:
MRI HEAD WITHOUT CONTRAST
TECHNIQUE: Multiplanar, multiecho pulse sequences of the brain and surrounding
structures were obtained without intravenous contrast.

[Series 5: T1 · sagittal · 4.0mm · 0.81mm/px · 2 of 30 slices shown (1 of 2)]
[im 1/30]
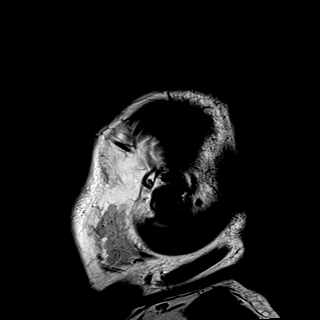
[im 30/30]
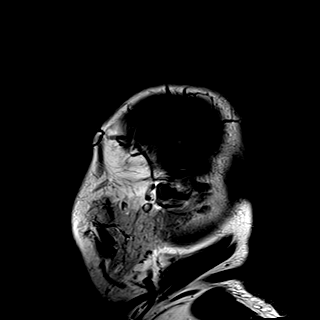

[Series 6: DWI · axial · 3.0mm · 1.50mm/px · z∈[-30,+117]mm · 7 of 94 slices shown (1 of 4)]
[im 1/94]
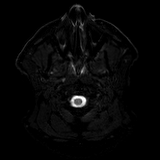
[im 16/94]
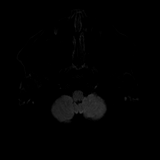
[im 32/94]
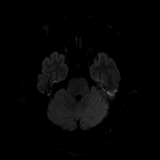
[im 47/94]
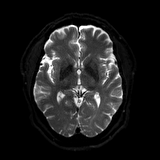
[im 63/94]
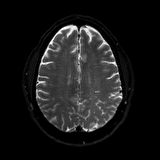
[im 78/94]
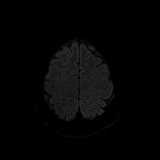
[im 94/94]
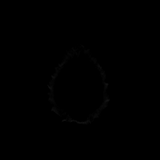

[Series 7: DWI · axial · 3.0mm · 1.50mm/px · z∈[-30,+117]mm · 3 of 47 slices shown (2 of 4)]
[im 1/47]
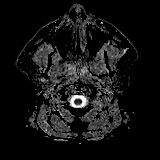
[im 24/47]
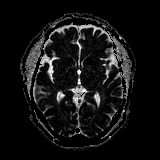
[im 47/47]
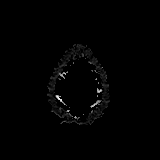

[Series 8: DWI · coronal · 5.0mm · 1.44mm/px · 5 of 68 slices shown (3 of 4)]
[im 1/68]
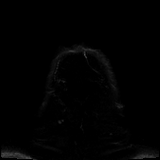
[im 17/68]
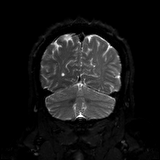
[im 34/68]
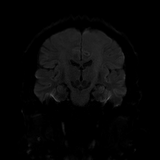
[im 51/68]
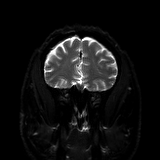
[im 68/68]
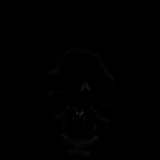

[Series 9: DWI · coronal · 5.0mm · 1.44mm/px · 2 of 34 slices shown (4 of 4)]
[im 1/34]
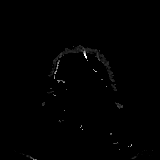
[im 34/34]
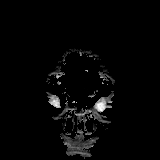

[Series 10: T2 · axial · 4.0mm · 0.38mm/px · z∈[-41,+107]mm · 2 of 30 slices shown (1 of 2)]
[im 1/30]
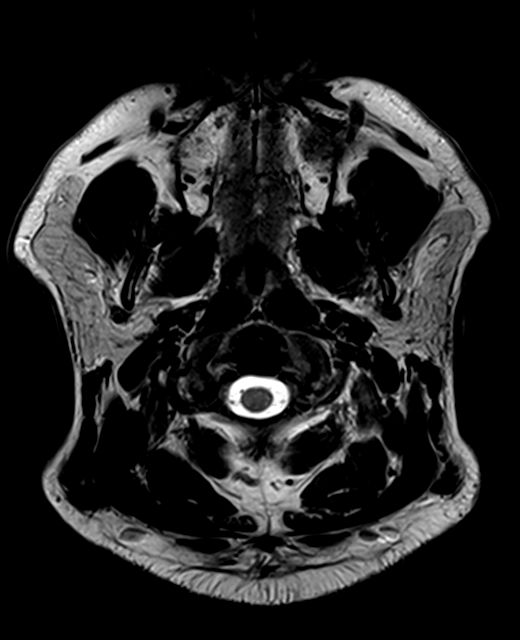
[im 30/30]
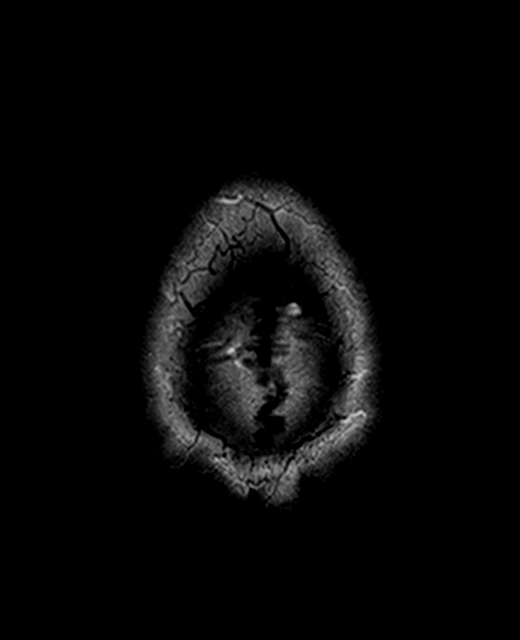

[Series 11: FLAIR · axial · 4.0mm · 0.72mm/px · z∈[-41,+107]mm · 2 of 30 slices shown]
[im 1/30]
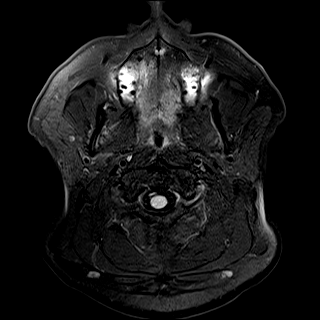
[im 30/30]
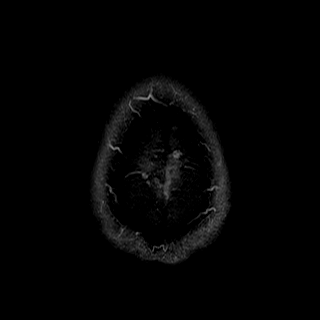

[Series 12: mip_images(sw) · axial · 12.0mm · 0.90mm/px · z∈[-33,+96]mm · 6 of 89 slices shown]
[im 1/89]
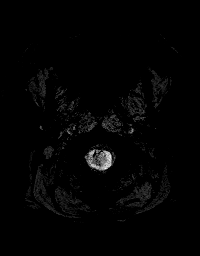
[im 18/89]
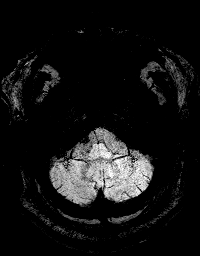
[im 36/89]
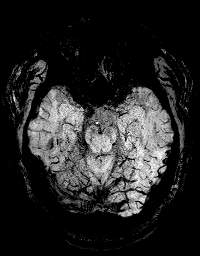
[im 53/89]
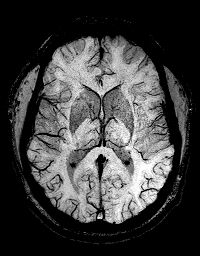
[im 71/89]
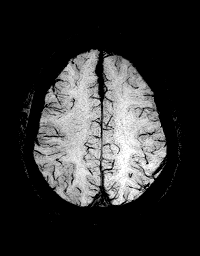
[im 89/89]
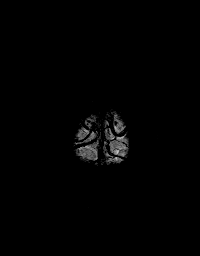

[Series 13: swi_images · axial · 1.5mm · 0.90mm/px · z∈[-39,+101]mm · 7 of 96 slices shown]
[im 1/96]
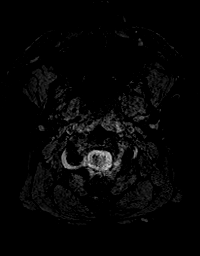
[im 16/96]
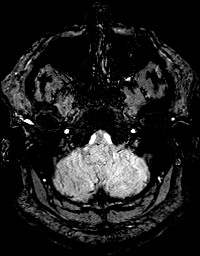
[im 32/96]
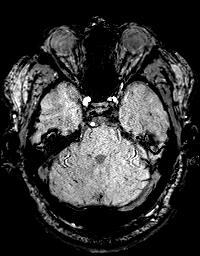
[im 48/96]
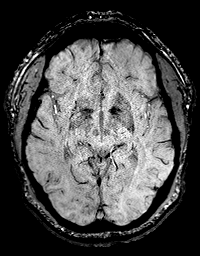
[im 64/96]
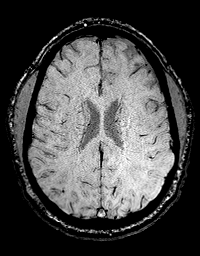
[im 80/96]
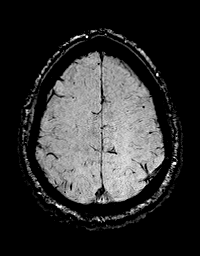
[im 96/96]
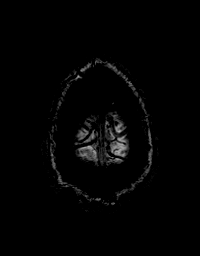

[Series 14: T1 · axial · 1.0mm · 0.90mm/px · z∈[-39,+101]mm · 10 of 144 slices shown (2 of 2)]
[im 1/144]
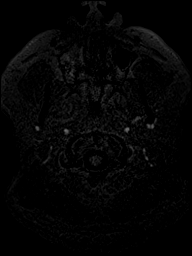
[im 16/144]
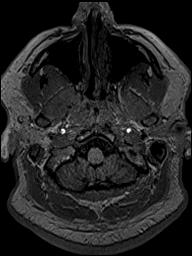
[im 32/144]
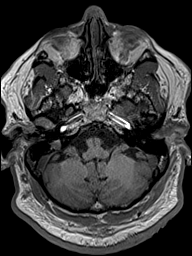
[im 48/144]
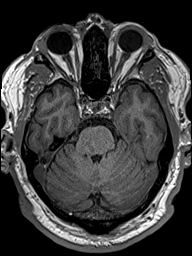
[im 64/144]
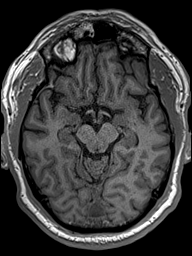
[im 80/144]
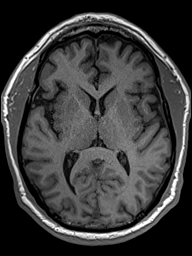
[im 96/144]
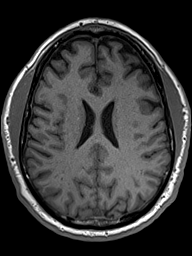
[im 112/144]
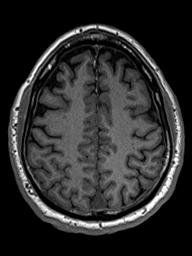
[im 128/144]
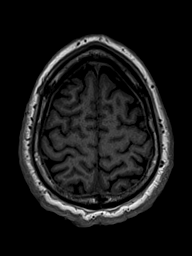
[im 144/144]
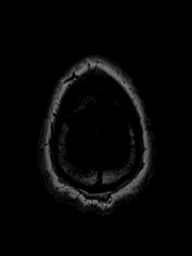

[Series 15: T2 · coronal · 4.5mm · 0.36mm/px · 2 of 30 slices shown (2 of 2)]
[im 1/30]
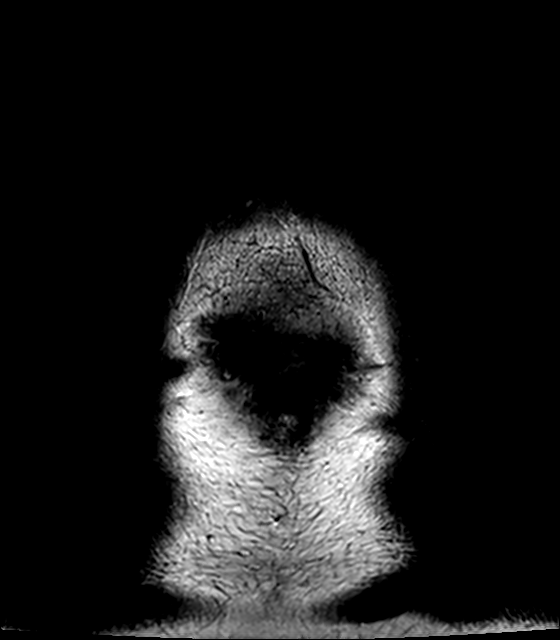
[im 30/30]
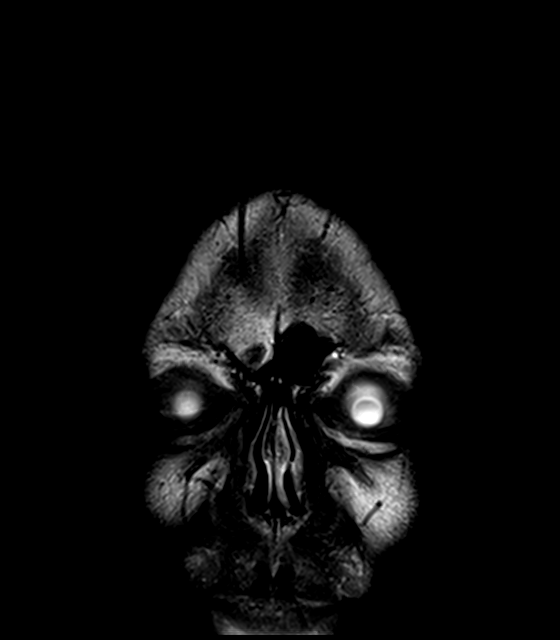

[48 of 48 positions shown; findings below may reference images not displayed]

FINDINGS: No evidence for acute infarction, hemorrhage, mass lesion,
hydrocephalus, or extra-axial fluid. Normal cerebral volume. No
white matter disease.

Partial empty sella. Prominent optic nerve sheaths. These could be
subtle signs of idiopathic intracranial hypertension. If clinically
indicated, elective lumbar puncture could provide further
information.

Cerebellar tonsils unremarkable. Cervical spondylosis with possible
spinal stenosis C3-C4. Flow voids are maintained. Extracranial soft
tissues otherwise unremarkable. Compared with priors, similar
appearance is noted.
IMPRESSION: No acute intracranial findings.

Partial empty sella with mild prominence of the optic nerve sheaths,
could be subtle signs of pseudotumor cerebri. Correlate clinically.
See comments above.

## 2017-08-06 DIAGNOSIS — R51 Headache: Secondary | ICD-10-CM | POA: Diagnosis not present

## 2017-08-06 DIAGNOSIS — J343 Hypertrophy of nasal turbinates: Secondary | ICD-10-CM | POA: Diagnosis not present

## 2017-08-06 DIAGNOSIS — J342 Deviated nasal septum: Secondary | ICD-10-CM | POA: Diagnosis not present

## 2017-08-06 DIAGNOSIS — J310 Chronic rhinitis: Secondary | ICD-10-CM | POA: Diagnosis not present

## 2017-08-06 DIAGNOSIS — H6123 Impacted cerumen, bilateral: Secondary | ICD-10-CM | POA: Diagnosis not present

## 2017-08-07 ENCOUNTER — Other Ambulatory Visit (INDEPENDENT_AMBULATORY_CARE_PROVIDER_SITE_OTHER): Payer: Self-pay | Admitting: Otolaryngology

## 2017-08-07 DIAGNOSIS — J329 Chronic sinusitis, unspecified: Secondary | ICD-10-CM

## 2017-08-12 ENCOUNTER — Ambulatory Visit
Admission: RE | Admit: 2017-08-12 | Discharge: 2017-08-12 | Disposition: A | Discharge status: Home / Self Care | Payer: BLUE CROSS/BLUE SHIELD | Source: Ambulatory Visit | Attending: Otolaryngology | Admitting: Otolaryngology

## 2017-08-12 DIAGNOSIS — J329 Chronic sinusitis, unspecified: Secondary | ICD-10-CM

## 2017-10-03 DIAGNOSIS — M961 Postlaminectomy syndrome, not elsewhere classified: Secondary | ICD-10-CM | POA: Diagnosis not present

## 2017-10-03 DIAGNOSIS — G8929 Other chronic pain: Secondary | ICD-10-CM | POA: Diagnosis not present

## 2017-10-03 DIAGNOSIS — M5440 Lumbago with sciatica, unspecified side: Secondary | ICD-10-CM | POA: Diagnosis not present

## 2017-10-03 DIAGNOSIS — M542 Cervicalgia: Secondary | ICD-10-CM | POA: Diagnosis not present

## 2017-10-07 DIAGNOSIS — L739 Follicular disorder, unspecified: Secondary | ICD-10-CM | POA: Diagnosis not present

## 2017-10-16 DIAGNOSIS — G8929 Other chronic pain: Secondary | ICD-10-CM | POA: Diagnosis not present

## 2017-10-16 DIAGNOSIS — M5030 Other cervical disc degeneration, unspecified cervical region: Secondary | ICD-10-CM | POA: Diagnosis not present

## 2017-10-16 DIAGNOSIS — M961 Postlaminectomy syndrome, not elsewhere classified: Secondary | ICD-10-CM | POA: Diagnosis not present

## 2017-10-30 DIAGNOSIS — Z9114 Patient's other noncompliance with medication regimen: Secondary | ICD-10-CM | POA: Diagnosis not present

## 2017-10-30 DIAGNOSIS — I10 Essential (primary) hypertension: Secondary | ICD-10-CM | POA: Diagnosis not present

## 2017-10-30 DIAGNOSIS — E119 Type 2 diabetes mellitus without complications: Secondary | ICD-10-CM | POA: Diagnosis not present

## 2017-11-28 ENCOUNTER — Other Ambulatory Visit: Payer: Self-pay | Admitting: Anesthesiology

## 2017-11-28 DIAGNOSIS — M5030 Other cervical disc degeneration, unspecified cervical region: Secondary | ICD-10-CM

## 2017-11-29 DIAGNOSIS — N400 Enlarged prostate without lower urinary tract symptoms: Secondary | ICD-10-CM | POA: Diagnosis not present

## 2017-11-29 DIAGNOSIS — R972 Elevated prostate specific antigen [PSA]: Secondary | ICD-10-CM | POA: Diagnosis not present

## 2017-12-05 ENCOUNTER — Ambulatory Visit
Admission: RE | Admit: 2017-12-05 | Discharge: 2017-12-05 | Disposition: A | Discharge status: Home / Self Care | Payer: BLUE CROSS/BLUE SHIELD | Source: Ambulatory Visit | Attending: Anesthesiology | Admitting: Anesthesiology

## 2017-12-05 DIAGNOSIS — M5030 Other cervical disc degeneration, unspecified cervical region: Secondary | ICD-10-CM

## 2017-12-05 DIAGNOSIS — M4802 Spinal stenosis, cervical region: Secondary | ICD-10-CM | POA: Diagnosis not present

## 2018-01-21 DIAGNOSIS — M961 Postlaminectomy syndrome, not elsewhere classified: Secondary | ICD-10-CM | POA: Diagnosis not present

## 2018-02-05 DIAGNOSIS — R972 Elevated prostate specific antigen [PSA]: Secondary | ICD-10-CM | POA: Diagnosis not present

## 2018-02-19 DIAGNOSIS — I10 Essential (primary) hypertension: Secondary | ICD-10-CM | POA: Diagnosis not present

## 2018-02-19 DIAGNOSIS — Z6841 Body Mass Index (BMI) 40.0 and over, adult: Secondary | ICD-10-CM | POA: Diagnosis not present

## 2018-02-19 DIAGNOSIS — M961 Postlaminectomy syndrome, not elsewhere classified: Secondary | ICD-10-CM | POA: Diagnosis not present

## 2018-02-21 DIAGNOSIS — M961 Postlaminectomy syndrome, not elsewhere classified: Secondary | ICD-10-CM | POA: Diagnosis not present

## 2018-03-25 DIAGNOSIS — M961 Postlaminectomy syndrome, not elsewhere classified: Secondary | ICD-10-CM | POA: Diagnosis not present

## 2018-03-25 DIAGNOSIS — M4712 Other spondylosis with myelopathy, cervical region: Secondary | ICD-10-CM | POA: Diagnosis not present

## 2018-03-25 DIAGNOSIS — Z6841 Body Mass Index (BMI) 40.0 and over, adult: Secondary | ICD-10-CM | POA: Diagnosis not present

## 2018-03-25 DIAGNOSIS — I10 Essential (primary) hypertension: Secondary | ICD-10-CM | POA: Diagnosis not present

## 2018-05-05 DIAGNOSIS — G894 Chronic pain syndrome: Secondary | ICD-10-CM | POA: Diagnosis not present

## 2018-05-05 DIAGNOSIS — M5440 Lumbago with sciatica, unspecified side: Secondary | ICD-10-CM | POA: Diagnosis not present

## 2018-05-05 DIAGNOSIS — M961 Postlaminectomy syndrome, not elsewhere classified: Secondary | ICD-10-CM | POA: Diagnosis not present

## 2018-05-30 DIAGNOSIS — N400 Enlarged prostate without lower urinary tract symptoms: Secondary | ICD-10-CM | POA: Diagnosis not present

## 2018-05-30 DIAGNOSIS — R972 Elevated prostate specific antigen [PSA]: Secondary | ICD-10-CM | POA: Diagnosis not present

## 2018-08-21 DIAGNOSIS — M542 Cervicalgia: Secondary | ICD-10-CM | POA: Diagnosis not present

## 2018-08-21 DIAGNOSIS — Z6841 Body Mass Index (BMI) 40.0 and over, adult: Secondary | ICD-10-CM | POA: Diagnosis not present

## 2018-10-03 DIAGNOSIS — I10 Essential (primary) hypertension: Secondary | ICD-10-CM | POA: Diagnosis not present

## 2018-10-03 DIAGNOSIS — Z6841 Body Mass Index (BMI) 40.0 and over, adult: Secondary | ICD-10-CM | POA: Diagnosis not present

## 2018-10-03 DIAGNOSIS — M961 Postlaminectomy syndrome, not elsewhere classified: Secondary | ICD-10-CM | POA: Diagnosis not present

## 2018-10-13 DIAGNOSIS — Z79899 Other long term (current) drug therapy: Secondary | ICD-10-CM | POA: Diagnosis not present

## 2018-10-13 DIAGNOSIS — Z125 Encounter for screening for malignant neoplasm of prostate: Secondary | ICD-10-CM | POA: Diagnosis not present

## 2018-10-13 DIAGNOSIS — E119 Type 2 diabetes mellitus without complications: Secondary | ICD-10-CM | POA: Diagnosis not present

## 2018-10-13 DIAGNOSIS — I10 Essential (primary) hypertension: Secondary | ICD-10-CM | POA: Diagnosis not present

## 2018-10-24 DIAGNOSIS — I10 Essential (primary) hypertension: Secondary | ICD-10-CM | POA: Diagnosis not present

## 2018-10-24 DIAGNOSIS — Z23 Encounter for immunization: Secondary | ICD-10-CM | POA: Diagnosis not present

## 2018-10-24 DIAGNOSIS — E785 Hyperlipidemia, unspecified: Secondary | ICD-10-CM | POA: Diagnosis not present

## 2018-10-24 DIAGNOSIS — E1159 Type 2 diabetes mellitus with other circulatory complications: Secondary | ICD-10-CM | POA: Diagnosis not present

## 2018-11-28 ENCOUNTER — Encounter: Payer: Self-pay | Admitting: Neurology

## 2019-01-28 DIAGNOSIS — M9901 Segmental and somatic dysfunction of cervical region: Secondary | ICD-10-CM | POA: Diagnosis not present

## 2019-01-28 DIAGNOSIS — M62830 Muscle spasm of back: Secondary | ICD-10-CM | POA: Diagnosis not present

## 2019-01-28 DIAGNOSIS — M5032 Other cervical disc degeneration, mid-cervical region, unspecified level: Secondary | ICD-10-CM | POA: Diagnosis not present

## 2019-01-28 DIAGNOSIS — M9902 Segmental and somatic dysfunction of thoracic region: Secondary | ICD-10-CM | POA: Diagnosis not present

## 2019-02-04 ENCOUNTER — Encounter

## 2019-02-04 ENCOUNTER — Ambulatory Visit: Payer: BLUE CROSS/BLUE SHIELD | Admitting: Neurology

## 2019-02-06 DIAGNOSIS — I10 Essential (primary) hypertension: Secondary | ICD-10-CM | POA: Diagnosis not present

## 2019-02-06 DIAGNOSIS — E1159 Type 2 diabetes mellitus with other circulatory complications: Secondary | ICD-10-CM | POA: Diagnosis not present

## 2019-02-06 DIAGNOSIS — Z79899 Other long term (current) drug therapy: Secondary | ICD-10-CM | POA: Diagnosis not present

## 2019-02-06 DIAGNOSIS — E785 Hyperlipidemia, unspecified: Secondary | ICD-10-CM | POA: Diagnosis not present

## 2019-02-11 DIAGNOSIS — M9902 Segmental and somatic dysfunction of thoracic region: Secondary | ICD-10-CM | POA: Diagnosis not present

## 2019-02-11 DIAGNOSIS — M5032 Other cervical disc degeneration, mid-cervical region, unspecified level: Secondary | ICD-10-CM | POA: Diagnosis not present

## 2019-02-11 DIAGNOSIS — M9901 Segmental and somatic dysfunction of cervical region: Secondary | ICD-10-CM | POA: Diagnosis not present

## 2019-02-11 DIAGNOSIS — M62830 Muscle spasm of back: Secondary | ICD-10-CM | POA: Diagnosis not present

## 2019-02-13 DIAGNOSIS — E1159 Type 2 diabetes mellitus with other circulatory complications: Secondary | ICD-10-CM | POA: Diagnosis not present

## 2019-02-13 DIAGNOSIS — E785 Hyperlipidemia, unspecified: Secondary | ICD-10-CM | POA: Diagnosis not present

## 2019-03-04 DIAGNOSIS — M5032 Other cervical disc degeneration, mid-cervical region, unspecified level: Secondary | ICD-10-CM | POA: Diagnosis not present

## 2019-03-04 DIAGNOSIS — M9901 Segmental and somatic dysfunction of cervical region: Secondary | ICD-10-CM | POA: Diagnosis not present

## 2019-03-04 DIAGNOSIS — M9902 Segmental and somatic dysfunction of thoracic region: Secondary | ICD-10-CM | POA: Diagnosis not present

## 2019-03-04 DIAGNOSIS — M62830 Muscle spasm of back: Secondary | ICD-10-CM | POA: Diagnosis not present

## 2019-03-11 DIAGNOSIS — M5032 Other cervical disc degeneration, mid-cervical region, unspecified level: Secondary | ICD-10-CM | POA: Diagnosis not present

## 2019-03-11 DIAGNOSIS — M9902 Segmental and somatic dysfunction of thoracic region: Secondary | ICD-10-CM | POA: Diagnosis not present

## 2019-03-11 DIAGNOSIS — M9901 Segmental and somatic dysfunction of cervical region: Secondary | ICD-10-CM | POA: Diagnosis not present

## 2019-03-11 DIAGNOSIS — M62830 Muscle spasm of back: Secondary | ICD-10-CM | POA: Diagnosis not present

## 2019-03-16 DIAGNOSIS — M5137 Other intervertebral disc degeneration, lumbosacral region: Secondary | ICD-10-CM | POA: Diagnosis not present

## 2019-03-16 DIAGNOSIS — M9903 Segmental and somatic dysfunction of lumbar region: Secondary | ICD-10-CM | POA: Diagnosis not present

## 2019-03-16 DIAGNOSIS — M5032 Other cervical disc degeneration, mid-cervical region, unspecified level: Secondary | ICD-10-CM | POA: Diagnosis not present

## 2019-03-16 DIAGNOSIS — M9905 Segmental and somatic dysfunction of pelvic region: Secondary | ICD-10-CM | POA: Diagnosis not present

## 2019-03-18 DIAGNOSIS — M5137 Other intervertebral disc degeneration, lumbosacral region: Secondary | ICD-10-CM | POA: Diagnosis not present

## 2019-03-18 DIAGNOSIS — M9903 Segmental and somatic dysfunction of lumbar region: Secondary | ICD-10-CM | POA: Diagnosis not present

## 2019-03-18 DIAGNOSIS — M9905 Segmental and somatic dysfunction of pelvic region: Secondary | ICD-10-CM | POA: Diagnosis not present

## 2019-03-18 DIAGNOSIS — M5032 Other cervical disc degeneration, mid-cervical region, unspecified level: Secondary | ICD-10-CM | POA: Diagnosis not present

## 2019-08-01 ENCOUNTER — Emergency Department (HOSPITAL_COMMUNITY)
Admission: EM | Admit: 2019-08-01 | Discharge: 2019-08-01 | Disposition: A | Discharge status: Home / Self Care | Payer: BC Managed Care – PPO | Attending: Emergency Medicine | Admitting: Emergency Medicine

## 2019-08-01 ENCOUNTER — Other Ambulatory Visit: Payer: Self-pay

## 2019-08-01 ENCOUNTER — Encounter (HOSPITAL_COMMUNITY): Payer: Self-pay | Admitting: Emergency Medicine

## 2019-08-01 ENCOUNTER — Emergency Department (HOSPITAL_COMMUNITY): Payer: BC Managed Care – PPO

## 2019-08-01 DIAGNOSIS — Z7982 Long term (current) use of aspirin: Secondary | ICD-10-CM | POA: Diagnosis not present

## 2019-08-01 DIAGNOSIS — M545 Low back pain: Secondary | ICD-10-CM | POA: Diagnosis not present

## 2019-08-01 DIAGNOSIS — E119 Type 2 diabetes mellitus without complications: Secondary | ICD-10-CM | POA: Diagnosis not present

## 2019-08-01 DIAGNOSIS — Z79899 Other long term (current) drug therapy: Secondary | ICD-10-CM | POA: Insufficient documentation

## 2019-08-01 DIAGNOSIS — G8929 Other chronic pain: Secondary | ICD-10-CM

## 2019-08-01 DIAGNOSIS — I10 Essential (primary) hypertension: Secondary | ICD-10-CM | POA: Insufficient documentation

## 2019-08-01 DIAGNOSIS — J449 Chronic obstructive pulmonary disease, unspecified: Secondary | ICD-10-CM | POA: Insufficient documentation

## 2019-08-01 MED ORDER — METHOCARBAMOL 500 MG PO TABS: 500.0000 mg | ORAL_TABLET | Freq: Two times a day (BID) | ORAL | 0 refills | Status: DC | PRN

## 2019-08-01 MED ORDER — KETOROLAC TROMETHAMINE 30 MG/ML IJ SOLN
30.0000 mg | Freq: Once | INTRAMUSCULAR | Status: AC
  Administered 2019-08-01: 30 mg via INTRAMUSCULAR
  Filled 2019-08-01: qty 1

## 2019-08-01 MED ORDER — TRAMADOL HCL 50 MG PO TABS: 50.0000 mg | ORAL_TABLET | Freq: Four times a day (QID) | ORAL | 0 refills | Status: DC | PRN

## 2019-08-01 NOTE — ED Provider Notes (Signed)
[facility] Provider Note   CSN: [id number] Arrival date & time: 08/01/19  0932     History   Chief Complaint Chief Complaint  Patient presents with  . Back Pain    HPI Lance Frazier is a 58 y.o. male with past medical history of chronic low back pain status post L4-L5 fusion, type 2 diabetes, hypertension, presenting to the emergency department with complaint of low back pain that began last week.  Patient states he was at a wedding last weekend and thinks he overdid it.  He states he has not been able to rest his back since the wedding and thinks this is causing his worsening chronic back pain.  His pain is in the right lower back, feels similar to prior chronic back pain.  He is unable to manage his symptoms at home with over-the-counter medications.  He states he called his spine specialist, Dr. Arnoldo Morale, who prescribed him a steroid Dosepak though recommended he report to the ED as he was unable to see him until later in the week.  Patient denies any new numbness or weakness in extremities, bowel or bladder incontinence, saddle paresthesia, abdominal pain, fevers.  No falls.     The history is provided by the patient.    Past Medical History:  Diagnosis Date  . Anxiety   . Arthritis   . Chest pain, precordial 05/27/2012   Myocardial infarction ruled out in 05/2012; atypical chest discomfort, most likely of musculoskeletal origin.   . Diabetes mellitus   . Fibromyalgia   . Hyperlipidemia 05/28/2012  . Hypertension   . Marijuana use   . Obesity    BMI of 40.4 at a weight of 265 pounds  . Obstructive sleep apnea 1989   CPAP treatment since 1989; status post uvulectomy    Patient Active Problem List   Diagnosis Date Noted  . Hypersomnia 12/29/2015  . Exertional dyspnea 12/29/2015  . Rectal bleeding 12/21/2014  . Rectal pain 12/21/2014  . Spondylolisthesis of lumbar region 09/08/2014  . Severe obesity (BMI >= 40) ([place]) 01/18/2014  . Hyperlipidemia  05/28/2012  . Obesity   . Chest pain, precordial 05/27/2012  . Hypertension 05/27/2012  . Diabetes mellitus type 2 in obese ([place]) 05/27/2012  . Obstructive sleep apnea 05/27/2012  . Marijuana use 05/27/2012  . Fibromyalgia 05/27/2012  . Chronic anxiety 05/27/2012  . KNEE PAIN, RIGHT 09/22/2007  . PLANTAR FASCIITIS, RIGHT 09/22/2007    Past Surgical History:  Procedure Laterality Date  . COLONOSCOPY  11/2009   Dr. Gala Romney: single external hemorrhoid tag, anal papilla, normal TI  . HEMORRHOID SURGERY  02/06/2012   Procedure: HEMORRHOIDECTOMY;  Surgeon: Jamesetta So, MD;  Location: [facility];  Service: General;  Laterality: N/A;  . HEMORRHOID SURGERY N/A 08/13/2014   Procedure: EXTENSIVE HEMORRHOIDECTOMY;  Surgeon: Jamesetta So, MD;  Location: [facility];  Service: General;  Laterality: N/A;  . HERNIA REPAIR     umbilical-[facility]  . KNEE ARTHROSCOPY     right x2  . LUMBAR FUSION  Sep 08, 2014  . TONSILLECTOMY    . UVULECTOMY          Home Medications    Prior to Admission medications   Medication Sig Start Date End Date Taking? Authorizing Provider  aspirin 81 MG tablet Take 81 mg by mouth daily.    [provider]  atorvastatin (LIPITOR) 40 MG tablet Take 40 mg by mouth daily.    [provider]  buPROPion (WELLBUTRIN XL)  150 MG 24 hr tablet Take 150 mg by mouth daily. 12/25/15   [provider]  canagliflozin (INVOKANA) 300 MG TABS tablet Take 300 mg by mouth daily before breakfast.    [provider]  cyclobenzaprine (FLEXERIL) 10 MG tablet Take 10 mg by mouth daily as needed. 09/28/15   [provider]  gabapentin (NEURONTIN) 300 MG capsule Take 1 capsule by mouth 3 (three) times daily. 03/05/16   [provider]  glimepiride (AMARYL) 2 MG tablet Take 2 mg by mouth daily. 12/23/15   [provider]  glucose blood (BAYER CONTOUR TEST) test strip 1 each by Other route See admin instructions. Check blood sugar daily.    [provider]  lisinopril (PRINIVIL,ZESTRIL) 10 MG tablet Take 10 mg by mouth daily.    [provider]  methocarbamol (ROBAXIN) 500 MG tablet Take 1 tablet (500 mg total) by mouth 2 (two) times daily as needed for muscle spasms. 08/01/19   Robinson, [place] N, PA-C  Nitroglycerin (RECTIV) 0.4 % OINT Place 1 inch rectally 2 (two) times daily. For 3 weeks. Wear gloves when applying, wash hands thoroughly afterward. 12/21/14   Annitta Needs, NP  traMADol (ULTRAM) 50 MG tablet Take 1 tablet (50 mg total) by mouth every 6 (six) hours as needed for severe pain. 08/01/19   Robinson, [place] N, PA-C    Family History Family History  Problem Relation Age of Onset  . Coronary artery disease Father 28       Acute MI  . Anesthesia problems Neg Hx   . Hypotension Neg Hx   . Malignant hyperthermia Neg Hx   . Pseudochol deficiency Neg Hx   . Colon cancer Neg Hx     Social History Social History   Tobacco Use  . Smoking status: Never Smoker  . Smokeless tobacco: Never Used  Substance Use Topics  . Alcohol use: No    Alcohol/week: 0.0 standard drinks  . Drug use: Not Currently    Types: Marijuana    Comment: quit 3 months ago     Allergies   Patient has no known allergies.   Review of Systems Review of Systems  Gastrointestinal: Negative for abdominal pain.  Genitourinary: Negative for difficulty urinating, dysuria and frequency.  Musculoskeletal: Positive for back pain.  Neurological: Negative for weakness and numbness.     Physical Exam Updated Vital Signs BP 121/81 (BP Location: Right Arm)   Pulse 64   Temp 98.6 F (37 C) (Oral)   Resp 16   Ht 5\' 6"  (1.676 m)   Wt 113.4 kg   SpO2 97%   BMI 40.35 kg/m   Physical Exam Vitals signs and nursing note reviewed.  Constitutional:      General: He is not in acute distress.    Appearance: He is well-developed. He is obese.  HENT:     Head: Normocephalic and atraumatic.  Eyes:     Conjunctiva/sclera: Conjunctivae  normal.  Cardiovascular:     Rate and Rhythm: Normal rate and regular rhythm.  Pulmonary:     Effort: Pulmonary effort is normal. No respiratory distress.     Breath sounds: Normal breath sounds.  Abdominal:     General: Bowel sounds are normal.     Tenderness: There is no abdominal tenderness. There is no guarding or rebound.  Musculoskeletal:     Comments: There is a scar present to the lumbar spine.  There is some tenderness to the right lower lumbar area.  Neurological:     Mental Status: He is alert.     Comments: Normal tone.  5/5 strength in BUE and BLE including strong and equal grip strength and dorsiflexion/plantar flexion Sensory: Pinprick and light touch normal in BLE extremities.  Gait: normal gait and balance CV: distal pulses palpable throughout    Psychiatric:        Mood and Affect: Mood normal.        Behavior: Behavior normal.      ED Treatments / Results  Labs (all labs ordered are listed, but only abnormal results are displayed) Labs Reviewed - No data to display  EKG None  Radiology Dg Lumbar Spine Complete  Result Date: 08/01/2019 CLINICAL DATA:  Back pain for 2 days EXAM: LUMBAR SPINE - COMPLETE 4+ VIEW COMPARISON:  09/08/2014 lateral radiography FINDINGS: L4-5 PLIF with solid appearing arthrodesis. Generalized spondylosis with disc narrowing greatest at L2-3, progressed. No evidence of fracture or bone lesion. Spinal stimulator which is unremarkable where covered. IMPRESSION: 1. No acute finding. 2. Disc degeneration with progression from 2015.  L4-5 solid fusion. Electronically Signed   By: Monte Fantasia M.D.   On: 08/01/2019 11:16    Procedures Procedures (including critical care time)  Medications Ordered in ED Medications  ketorolac (TORADOL) 30 MG/ML injection 30 mg (30 mg Intramuscular Given 08/01/19 1150)     Initial Impression / Assessment and Plan / ED Course  I have reviewed the triage vital signs and the nursing notes.   Pertinent labs & imaging results that were available during my care of the patient were reviewed by me and considered in my medical decision making (see chart for details).       Patient presenting with acute exacerbation of chronic low back pain after "overdoing it" at a wedding last weekend.  No red flags or neurologic deficits present on exam today.    L-spine x-ray was done in triage which reveals some progression of his disc degeneration though no acute findings. Based on exam and presentation, no indication for advanced imaging at this time. He is followed by Dr. Arnoldo Morale, encouraged to follow-up regarding his exacerbation of low back pain.  At this time patient is safe for discharge with prescription of Robaxin and small amount of tramadol for acute pain.  Encourage he continue the [place] as prescribed by Dr. Arnoldo Morale.  Strict return precautions.  Patient agreeable to plan and safe for discharge.  Discussed results, findings, treatment and follow up. Patient advised of return precautions. Patient verbalized understanding and agreed with plan.  [place] Controlled Substance reporting System queried   Final Clinical Impressions(s) / ED Diagnoses   Final diagnoses:  Chronic right-sided low back pain without sciatica    ED Discharge Orders         Ordered    traMADol (ULTRAM) 50 MG tablet  Every 6 hours PRN     08/01/19 1149    methocarbamol (ROBAXIN) 500 MG tablet  2 times daily PRN     08/01/19 1150           Robinson, [place] N, PA-C 08/01/19 Ericson, [place], DO 08/04/19 1104

## 2019-08-01 NOTE — Discharge Instructions (Signed)
Follow up closely with your spine doctor.  You can take the medications as prescribed for pain. Be aware the medications can make you drowsy, do not drive or drink alcohol while taking. Continue taking the steroids as prescribed. Return to the ER if you develop weakness in your legs, bowel or bladder continence, or numbness under your bottom.

## 2019-08-01 NOTE — ED Triage Notes (Signed)
Pt c/o stabbing lower back pain since Thursday. Pt hx of chronic back pain. Denies injury, but states he attended a wedding last weekend and thinks he "overdid it." Denies numbness or weakness in legs. Denies GI/GU issues.

## 2019-08-25 DIAGNOSIS — M545 Low back pain: Secondary | ICD-10-CM | POA: Diagnosis not present

## 2019-09-21 DIAGNOSIS — E1159 Type 2 diabetes mellitus with other circulatory complications: Secondary | ICD-10-CM | POA: Diagnosis not present

## 2019-09-21 DIAGNOSIS — E785 Hyperlipidemia, unspecified: Secondary | ICD-10-CM | POA: Diagnosis not present

## 2019-09-21 DIAGNOSIS — Z79899 Other long term (current) drug therapy: Secondary | ICD-10-CM | POA: Diagnosis not present

## 2019-09-25 DIAGNOSIS — I10 Essential (primary) hypertension: Secondary | ICD-10-CM | POA: Diagnosis not present

## 2019-09-25 DIAGNOSIS — E1159 Type 2 diabetes mellitus with other circulatory complications: Secondary | ICD-10-CM | POA: Diagnosis not present

## 2019-09-25 DIAGNOSIS — E785 Hyperlipidemia, unspecified: Secondary | ICD-10-CM | POA: Diagnosis not present

## 2019-10-22 DIAGNOSIS — M797 Fibromyalgia: Secondary | ICD-10-CM | POA: Diagnosis not present

## 2019-10-22 DIAGNOSIS — G8929 Other chronic pain: Secondary | ICD-10-CM | POA: Diagnosis not present

## 2020-01-06 DIAGNOSIS — M797 Fibromyalgia: Secondary | ICD-10-CM | POA: Diagnosis not present

## 2020-01-06 DIAGNOSIS — E1159 Type 2 diabetes mellitus with other circulatory complications: Secondary | ICD-10-CM | POA: Diagnosis not present

## 2020-01-06 DIAGNOSIS — Z79899 Other long term (current) drug therapy: Secondary | ICD-10-CM | POA: Diagnosis not present

## 2020-01-06 DIAGNOSIS — E785 Hyperlipidemia, unspecified: Secondary | ICD-10-CM | POA: Diagnosis not present

## 2020-01-08 DIAGNOSIS — I10 Essential (primary) hypertension: Secondary | ICD-10-CM | POA: Diagnosis not present

## 2020-01-08 DIAGNOSIS — R52 Pain, unspecified: Secondary | ICD-10-CM | POA: Diagnosis not present

## 2020-01-08 DIAGNOSIS — E1159 Type 2 diabetes mellitus with other circulatory complications: Secondary | ICD-10-CM | POA: Diagnosis not present

## 2020-01-08 DIAGNOSIS — E785 Hyperlipidemia, unspecified: Secondary | ICD-10-CM | POA: Diagnosis not present

## 2020-04-14 DIAGNOSIS — E1159 Type 2 diabetes mellitus with other circulatory complications: Secondary | ICD-10-CM | POA: Diagnosis not present

## 2020-04-29 DIAGNOSIS — Z6841 Body Mass Index (BMI) 40.0 and over, adult: Secondary | ICD-10-CM | POA: Diagnosis not present

## 2020-04-29 DIAGNOSIS — E1159 Type 2 diabetes mellitus with other circulatory complications: Secondary | ICD-10-CM | POA: Diagnosis not present

## 2020-04-29 DIAGNOSIS — I10 Essential (primary) hypertension: Secondary | ICD-10-CM | POA: Diagnosis not present

## 2020-08-03 DIAGNOSIS — E1159 Type 2 diabetes mellitus with other circulatory complications: Secondary | ICD-10-CM | POA: Diagnosis not present

## 2020-08-23 DIAGNOSIS — E1159 Type 2 diabetes mellitus with other circulatory complications: Secondary | ICD-10-CM | POA: Diagnosis not present

## 2020-08-23 DIAGNOSIS — I10 Essential (primary) hypertension: Secondary | ICD-10-CM | POA: Diagnosis not present

## 2020-08-30 ENCOUNTER — Other Ambulatory Visit (HOSPITAL_COMMUNITY): Payer: Self-pay | Admitting: Internal Medicine

## 2020-08-30 DIAGNOSIS — Z79899 Other long term (current) drug therapy: Secondary | ICD-10-CM | POA: Diagnosis not present

## 2020-08-30 DIAGNOSIS — R1013 Epigastric pain: Secondary | ICD-10-CM | POA: Diagnosis not present

## 2020-08-30 DIAGNOSIS — R1010 Upper abdominal pain, unspecified: Secondary | ICD-10-CM | POA: Diagnosis not present

## 2020-08-31 ENCOUNTER — Ambulatory Visit (HOSPITAL_COMMUNITY)
Admission: RE | Admit: 2020-08-31 | Discharge: 2020-08-31 | Disposition: A | Discharge status: Home / Self Care | Payer: BC Managed Care – PPO | Source: Ambulatory Visit | Attending: Internal Medicine | Admitting: Internal Medicine

## 2020-08-31 ENCOUNTER — Other Ambulatory Visit: Payer: Self-pay

## 2020-08-31 DIAGNOSIS — K7689 Other specified diseases of liver: Secondary | ICD-10-CM | POA: Diagnosis not present

## 2020-08-31 DIAGNOSIS — R1013 Epigastric pain: Secondary | ICD-10-CM | POA: Insufficient documentation

## 2020-09-08 DIAGNOSIS — K2970 Gastritis, unspecified, without bleeding: Secondary | ICD-10-CM | POA: Diagnosis not present

## 2020-11-24 DIAGNOSIS — R112 Nausea with vomiting, unspecified: Secondary | ICD-10-CM | POA: Diagnosis not present

## 2020-11-24 DIAGNOSIS — R059 Cough, unspecified: Secondary | ICD-10-CM | POA: Diagnosis not present

## 2020-11-24 DIAGNOSIS — U071 COVID-19: Secondary | ICD-10-CM | POA: Diagnosis not present

## 2020-11-25 ENCOUNTER — Other Ambulatory Visit: Payer: BC Managed Care – PPO

## 2020-11-29 ENCOUNTER — Other Ambulatory Visit: Payer: BC Managed Care – PPO

## 2020-11-29 DIAGNOSIS — Z20822 Contact with and (suspected) exposure to covid-19: Secondary | ICD-10-CM | POA: Diagnosis not present

## 2020-12-02 ENCOUNTER — Other Ambulatory Visit: Payer: BC Managed Care – PPO

## 2020-12-02 DIAGNOSIS — I10 Essential (primary) hypertension: Secondary | ICD-10-CM | POA: Diagnosis not present

## 2020-12-02 DIAGNOSIS — E1159 Type 2 diabetes mellitus with other circulatory complications: Secondary | ICD-10-CM | POA: Diagnosis not present

## 2020-12-02 DIAGNOSIS — Z79899 Other long term (current) drug therapy: Secondary | ICD-10-CM | POA: Diagnosis not present

## 2020-12-02 DIAGNOSIS — Z125 Encounter for screening for malignant neoplasm of prostate: Secondary | ICD-10-CM | POA: Diagnosis not present

## 2020-12-05 DIAGNOSIS — E785 Hyperlipidemia, unspecified: Secondary | ICD-10-CM | POA: Diagnosis not present

## 2020-12-05 DIAGNOSIS — E1169 Type 2 diabetes mellitus with other specified complication: Secondary | ICD-10-CM | POA: Diagnosis not present

## 2020-12-05 DIAGNOSIS — E730 Congenital lactase deficiency: Secondary | ICD-10-CM | POA: Diagnosis not present

## 2021-03-13 DIAGNOSIS — E1159 Type 2 diabetes mellitus with other circulatory complications: Secondary | ICD-10-CM | POA: Diagnosis not present

## 2021-03-21 DIAGNOSIS — E1100 Type 2 diabetes mellitus with hyperosmolarity without nonketotic hyperglycemic-hyperosmolar coma (NKHHC): Secondary | ICD-10-CM | POA: Diagnosis not present

## 2021-03-21 DIAGNOSIS — I10 Essential (primary) hypertension: Secondary | ICD-10-CM | POA: Diagnosis not present

## 2021-03-21 DIAGNOSIS — R7309 Other abnormal glucose: Secondary | ICD-10-CM | POA: Diagnosis not present

## 2021-05-09 DIAGNOSIS — M546 Pain in thoracic spine: Secondary | ICD-10-CM | POA: Diagnosis not present

## 2021-05-09 DIAGNOSIS — M542 Cervicalgia: Secondary | ICD-10-CM | POA: Diagnosis not present

## 2021-05-09 DIAGNOSIS — G8929 Other chronic pain: Secondary | ICD-10-CM | POA: Diagnosis not present

## 2021-05-09 DIAGNOSIS — M4712 Other spondylosis with myelopathy, cervical region: Secondary | ICD-10-CM | POA: Diagnosis not present

## 2021-05-17 ENCOUNTER — Other Ambulatory Visit: Payer: Self-pay | Admitting: Student

## 2021-05-17 DIAGNOSIS — M4712 Other spondylosis with myelopathy, cervical region: Secondary | ICD-10-CM

## 2021-05-18 ENCOUNTER — Telehealth: Payer: Self-pay

## 2021-05-18 NOTE — Telephone Encounter (Signed)
Phone call to patient to verify medication list and allergies for myelogram procedure. Pt reports he is no longer taking Wellbutrin or Tramadol. Pt aware he will not need to hold any medications for this procedure. Pre and post procedure instructions reviewed with pt. Pt also instructed to have a driver the day of his myelogram procedure. Pt verbalized understanding.

## 2021-05-26 ENCOUNTER — Ambulatory Visit
Admission: RE | Admit: 2021-05-26 | Discharge: 2021-05-26 | Disposition: A | Discharge status: Home / Self Care | Payer: BC Managed Care – PPO | Source: Ambulatory Visit | Attending: Student | Admitting: Student

## 2021-05-26 DIAGNOSIS — M25511 Pain in right shoulder: Secondary | ICD-10-CM | POA: Diagnosis not present

## 2021-05-26 DIAGNOSIS — M5023 Other cervical disc displacement, cervicothoracic region: Secondary | ICD-10-CM | POA: Diagnosis not present

## 2021-05-26 DIAGNOSIS — M2578 Osteophyte, vertebrae: Secondary | ICD-10-CM | POA: Diagnosis not present

## 2021-05-26 DIAGNOSIS — M4312 Spondylolisthesis, cervical region: Secondary | ICD-10-CM | POA: Diagnosis not present

## 2021-05-26 DIAGNOSIS — M4712 Other spondylosis with myelopathy, cervical region: Secondary | ICD-10-CM

## 2021-05-26 DIAGNOSIS — M4802 Spinal stenosis, cervical region: Secondary | ICD-10-CM | POA: Diagnosis not present

## 2021-05-26 DIAGNOSIS — M25512 Pain in left shoulder: Secondary | ICD-10-CM | POA: Diagnosis not present

## 2021-05-26 MED ORDER — IOPAMIDOL (ISOVUE-M 300) INJECTION 61%
15.0000 mL | Freq: Once | INTRAMUSCULAR | Status: AC
  Administered 2021-05-26: 15 mL via INTRATHECAL

## 2021-05-26 MED ORDER — DIAZEPAM 5 MG PO TABS
10.0000 mg | ORAL_TABLET | Freq: Once | ORAL | Status: AC
  Administered 2021-05-26: 10 mg via ORAL

## 2021-05-26 NOTE — Progress Notes (Signed)
Patient has a spinal cord stimulator which he just turned off for a cervical myelogram.

## 2021-05-26 NOTE — Discharge Instructions (Signed)

## 2021-06-20 DIAGNOSIS — M4312 Spondylolisthesis, cervical region: Secondary | ICD-10-CM | POA: Diagnosis not present

## 2021-06-20 DIAGNOSIS — M542 Cervicalgia: Secondary | ICD-10-CM | POA: Diagnosis not present

## 2021-06-20 DIAGNOSIS — I10 Essential (primary) hypertension: Secondary | ICD-10-CM | POA: Diagnosis not present

## 2021-06-20 DIAGNOSIS — M4722 Other spondylosis with radiculopathy, cervical region: Secondary | ICD-10-CM | POA: Diagnosis not present

## 2021-08-21 DIAGNOSIS — E1159 Type 2 diabetes mellitus with other circulatory complications: Secondary | ICD-10-CM | POA: Diagnosis not present

## 2021-09-05 DIAGNOSIS — M545 Low back pain, unspecified: Secondary | ICD-10-CM | POA: Diagnosis not present

## 2021-09-05 DIAGNOSIS — E1169 Type 2 diabetes mellitus with other specified complication: Secondary | ICD-10-CM | POA: Diagnosis not present

## 2021-09-05 DIAGNOSIS — R7309 Other abnormal glucose: Secondary | ICD-10-CM | POA: Diagnosis not present

## 2021-10-20 DIAGNOSIS — H906 Mixed conductive and sensorineural hearing loss, bilateral: Secondary | ICD-10-CM | POA: Diagnosis not present

## 2021-10-20 DIAGNOSIS — H6983 Other specified disorders of Eustachian tube, bilateral: Secondary | ICD-10-CM | POA: Diagnosis not present

## 2021-10-20 DIAGNOSIS — H6123 Impacted cerumen, bilateral: Secondary | ICD-10-CM | POA: Diagnosis not present

## 2021-10-20 DIAGNOSIS — H6523 Chronic serous otitis media, bilateral: Secondary | ICD-10-CM | POA: Diagnosis not present

## 2021-10-27 DIAGNOSIS — H6983 Other specified disorders of Eustachian tube, bilateral: Secondary | ICD-10-CM | POA: Diagnosis not present

## 2021-10-27 DIAGNOSIS — J310 Chronic rhinitis: Secondary | ICD-10-CM | POA: Diagnosis not present

## 2021-10-27 DIAGNOSIS — H903 Sensorineural hearing loss, bilateral: Secondary | ICD-10-CM | POA: Diagnosis not present

## 2021-11-13 DIAGNOSIS — J209 Acute bronchitis, unspecified: Secondary | ICD-10-CM | POA: Diagnosis not present

## 2021-11-13 DIAGNOSIS — J019 Acute sinusitis, unspecified: Secondary | ICD-10-CM | POA: Diagnosis not present

## 2021-11-13 DIAGNOSIS — J069 Acute upper respiratory infection, unspecified: Secondary | ICD-10-CM | POA: Diagnosis not present

## 2021-11-27 DIAGNOSIS — H6523 Chronic serous otitis media, bilateral: Secondary | ICD-10-CM | POA: Diagnosis not present

## 2021-11-27 DIAGNOSIS — J310 Chronic rhinitis: Secondary | ICD-10-CM | POA: Diagnosis not present

## 2021-11-27 DIAGNOSIS — R0982 Postnasal drip: Secondary | ICD-10-CM | POA: Diagnosis not present

## 2021-11-27 DIAGNOSIS — H903 Sensorineural hearing loss, bilateral: Secondary | ICD-10-CM | POA: Diagnosis not present

## 2021-11-27 DIAGNOSIS — J343 Hypertrophy of nasal turbinates: Secondary | ICD-10-CM | POA: Diagnosis not present

## 2021-11-30 DIAGNOSIS — J189 Pneumonia, unspecified organism: Secondary | ICD-10-CM | POA: Diagnosis not present

## 2021-12-01 DIAGNOSIS — J181 Lobar pneumonia, unspecified organism: Secondary | ICD-10-CM | POA: Diagnosis not present

## 2021-12-05 DIAGNOSIS — J181 Lobar pneumonia, unspecified organism: Secondary | ICD-10-CM | POA: Diagnosis not present

## 2021-12-06 DIAGNOSIS — I10 Essential (primary) hypertension: Secondary | ICD-10-CM | POA: Diagnosis not present

## 2021-12-06 DIAGNOSIS — Z125 Encounter for screening for malignant neoplasm of prostate: Secondary | ICD-10-CM | POA: Diagnosis not present

## 2021-12-06 DIAGNOSIS — Z79899 Other long term (current) drug therapy: Secondary | ICD-10-CM | POA: Diagnosis not present

## 2021-12-06 DIAGNOSIS — E1159 Type 2 diabetes mellitus with other circulatory complications: Secondary | ICD-10-CM | POA: Diagnosis not present

## 2021-12-18 DIAGNOSIS — E785 Hyperlipidemia, unspecified: Secondary | ICD-10-CM | POA: Diagnosis not present

## 2021-12-18 DIAGNOSIS — I10 Essential (primary) hypertension: Secondary | ICD-10-CM | POA: Diagnosis not present

## 2021-12-18 DIAGNOSIS — R7309 Other abnormal glucose: Secondary | ICD-10-CM | POA: Diagnosis not present

## 2021-12-21 ENCOUNTER — Ambulatory Visit (INDEPENDENT_AMBULATORY_CARE_PROVIDER_SITE_OTHER): Payer: BC Managed Care – PPO

## 2021-12-21 ENCOUNTER — Other Ambulatory Visit: Payer: Self-pay

## 2021-12-21 ENCOUNTER — Ambulatory Visit: Payer: BC Managed Care – PPO | Admitting: Pulmonary Disease

## 2021-12-21 ENCOUNTER — Encounter: Payer: Self-pay | Admitting: Pulmonary Disease

## 2021-12-21 VITALS — BP 124/82 | HR 80 | Ht 67.0 in | Wt 260.4 lb

## 2021-12-21 DIAGNOSIS — G4733 Obstructive sleep apnea (adult) (pediatric): Secondary | ICD-10-CM | POA: Diagnosis not present

## 2021-12-21 DIAGNOSIS — R062 Wheezing: Secondary | ICD-10-CM | POA: Diagnosis not present

## 2021-12-21 DIAGNOSIS — R0602 Shortness of breath: Secondary | ICD-10-CM | POA: Diagnosis not present

## 2021-12-21 DIAGNOSIS — R059 Cough, unspecified: Secondary | ICD-10-CM

## 2021-12-21 DIAGNOSIS — R0982 Postnasal drip: Secondary | ICD-10-CM

## 2021-12-21 DIAGNOSIS — I10 Essential (primary) hypertension: Secondary | ICD-10-CM | POA: Diagnosis not present

## 2021-12-21 MED ORDER — IPRATROPIUM BROMIDE 0.03 % NA SOLN: 2.0000 | Freq: Two times a day (BID) | NASAL | 12 refills | Status: DC

## 2021-12-21 MED ORDER — FLUTICASONE-SALMETEROL 115-21 MCG/ACT IN AERO: 2.0000 | INHALATION_SPRAY | Freq: Two times a day (BID) | RESPIRATORY_TRACT | 12 refills | Status: DC

## 2021-12-21 NOTE — Progress Notes (Signed)
Synopsis: Referred in February 2023 for cough and wheezing by Asencion Noble, MD  Subjective:   PATIENT ID: Lance Frazier GENDER: male DOB: 1961-01-10, MRN: [id number]  HPI  Chief Complaint  Patient presents with   Consult    Referred by occupational health for wheezing and chest congestion since November 2022. Productive cough with white phlegm.    Lance Frazier is a 61 year old male, never smoker with DMII, hypertension and obstructive sleep apnea on cpap who is referred to pulmonary clinic for cough and wheezing.   Patient reports having upper respiratory viral infection at the end of November which started with ear and sinus congestion and postnasal drainage that led to cough, sputum production, shortness of breath and wheezing.  He was treated with multiple rounds of antibiotics and prednisone tapers with improvement in his symptoms.  He continues to experience some cough producing clear sputum and intermittent wheezing and postnasal drainage.  He also reports that he has ear fullness.  He also complains of night sweats and chronic fatigue that is debilitating on certain days.  He reports having 2 days/month where he was unable to get out of bed due to fatigue which is up to 8 days/month now.  He reports labs at Dr. Ria Comment office were within normal limits.  His Jardiance was increased and he was recently advised to start a statin for his cardiovascular risk factors.  He previously saw Dr. Einar Gip for preop clearance for a pain stimulator procedure and would like to be referred back to Dr. Einar Gip for cardiovascular evaluation.  He is using Flonase nasal spray 1 spray per nostril twice daily.  He has not been provided any inhalers for his symptoms.  He denies any significant reflux issues.  He has been on lisinopril for many years.  He is a retired Development worker, international [employer].  He has issues with chronic back pain in which he is smoking marijuana for pain control.  He has been smoking since 2017.  He has  25-year history of sleep apnea in which he uses a CPAP nightly.  He has not been followed by a sleep physician in many years.  Past Medical History:  Diagnosis Date   Anxiety    Arthritis    Chest pain, precordial 05/27/2012   Myocardial infarction ruled out in 05/2012; atypical chest discomfort, most likely of musculoskeletal origin.    Diabetes mellitus    Fibromyalgia    Hyperlipidemia 05/28/2012   Hypertension    Marijuana use    Obesity    BMI of 40.4 at a weight of 265 pounds   Obstructive sleep apnea 1989   CPAP treatment since 1989; status post uvulectomy     Family History  Problem Relation Age of Onset   Coronary artery disease Father 65       Acute MI   Anesthesia problems Neg Hx    Hypotension Neg Hx    Malignant hyperthermia Neg Hx    Pseudochol deficiency Neg Hx    Colon cancer Neg Hx      Social History   Socioeconomic History   Marital status: Married    Spouse name: Not on file   Number of children: Not on file   Years of education: Not on file   Highest education level: Not on file  Occupational History   Not on file  Tobacco Use   Smoking status: Never   Smokeless tobacco: Never  Vaping Use   Vaping Use: Never used  Substance and Sexual  Activity   Alcohol use: No    Alcohol/week: 0.0 standard drinks   Drug use: Not Currently    Types: Marijuana    Comment: quit 3 months ago   Sexual activity: Yes    Birth control/protection: None  Other Topics Concern   Not on file  Social History Narrative   Not on file   Social Determinants of Health   Financial Resource Strain: Not on file  Food Insecurity: Not on file  Transportation Needs: Not on file  Physical Activity: Not on file  Stress: Not on file  Social Connections: Not on file  Intimate Partner Violence: Not on file     No Known Allergies   Outpatient Medications Prior to Visit  Medication Sig Dispense Refill   aspirin 81 MG tablet Take 81 mg by mouth daily.     baclofen (LIORESAL)  10 MG tablet Take 10 mg by mouth 3 (three) times daily.     canagliflozin (INVOKANA) 300 MG TABS tablet Take 300 mg by mouth daily before breakfast.     cyclobenzaprine (FLEXERIL) 10 MG tablet Take 10 mg by mouth daily as needed.  0   glimepiride (AMARYL) 2 MG tablet Take 2 mg by mouth daily.  3   glucose blood test strip 1 each by Other route See admin instructions. Check blood sugar daily.     guaifenesin (ROBITUSSIN) 100 MG/5ML syrup Take 10 mLs by mouth in the morning, at noon, and at bedtime.     JARDIANCE 25 MG TABS tablet Take 25 mg by mouth daily.     lisinopril (ZESTRIL) 20 MG tablet Take 20 mg by mouth daily.     metFORMIN (GLUCOPHAGE) 1000 MG tablet Take 1,000 mg by mouth 2 (two) times daily.     methocarbamol (ROBAXIN) 500 MG tablet Take 1 tablet (500 mg total) by mouth 2 (two) times daily as needed for muscle spasms. 20 tablet 0   Nitroglycerin (RECTIV) 0.4 % OINT Place 1 inch rectally 2 (two) times daily. For 3 weeks. Wear gloves when applying, wash hands thoroughly afterward. 1 Tube 1   pioglitazone (ACTOS) 30 MG tablet Take 30 mg by mouth daily.     rosuvastatin (CRESTOR) 5 MG tablet Take 5 mg by mouth daily.     sildenafil (VIAGRA) 100 MG tablet Take 100 mg by mouth daily.     traMADol (ULTRAM) 50 MG tablet Take 1 tablet (50 mg total) by mouth every 6 (six) hours as needed for severe pain. 12 tablet 0   TRULICITY 1.5 ON/6.2XB SOPN Inject 1.5 mg into the skin once a week.     atorvastatin (LIPITOR) 40 MG tablet Take 40 mg by mouth daily.     buPROPion (WELLBUTRIN XL) 150 MG 24 hr tablet Take 150 mg by mouth daily. (Patient not taking: Reported on 05/18/2021)  3   gabapentin (NEURONTIN) 300 MG capsule Take 1 capsule by mouth 3 (three) times daily. (Patient not taking: Reported on 05/18/2021)  0   lisinopril (PRINIVIL,ZESTRIL) 10 MG tablet Take 10 mg by mouth daily.     No facility-administered medications prior to visit.    Review of Systems  Constitutional:  Negative for  chills, fever, malaise/fatigue and weight loss.  HENT:  Positive for congestion and ear pain. Negative for sinus pain and sore throat.   Eyes: Negative.   Respiratory:  Positive for cough and sputum production. Negative for hemoptysis, shortness of breath and wheezing.   Cardiovascular:  Positive for palpitations. Negative for chest  pain, orthopnea, claudication and leg swelling.  Gastrointestinal:  Negative for abdominal pain, heartburn, nausea and vomiting.  Genitourinary: Negative.   Musculoskeletal:  Positive for joint pain. Negative for myalgias.  Skin:  Negative for rash.  Neurological:  Negative for weakness.  Endo/Heme/Allergies: Negative.   Psychiatric/Behavioral: Negative.     Objective:   Vitals:   12/21/21 0927  BP: 124/82  Pulse: 80  SpO2: 96%  Weight: 260 lb 6.4 oz (118.1 kg)  Height: 5\' 7"  (1.702 m)    Physical Exam Constitutional:      General: He is not in acute distress.    Appearance: He is obese.  HENT:     Head: Normocephalic and atraumatic.  Eyes:     Extraocular Movements: Extraocular movements intact.     Conjunctiva/sclera: Conjunctivae normal.     Pupils: Pupils are equal, round, and reactive to light.  Cardiovascular:     Rate and Rhythm: Normal rate and regular rhythm.     Pulses: Normal pulses.     Heart sounds: Normal heart sounds. No murmur heard. Pulmonary:     Effort: Pulmonary effort is normal.     Breath sounds: Normal breath sounds. No wheezing, rhonchi or rales.  Abdominal:     General: Bowel sounds are normal.     Palpations: Abdomen is soft.  Musculoskeletal:     Right lower leg: No edema.     Left lower leg: No edema.  Lymphadenopathy:     Cervical: No cervical adenopathy.  Skin:    General: Skin is warm and dry.  Neurological:     General: No focal deficit present.     Mental Status: He is alert.  Psychiatric:        Mood and Affect: Mood normal.        Behavior: Behavior normal.        Thought Content: Thought content  normal.        Judgment: Judgment normal.   CBC    Component Value Date/Time   WBC 14.7 (H) 09/09/2014 0553   RBC 4.79 09/09/2014 0553   HGB 14.2 09/09/2014 0553   HCT 40.5 09/09/2014 0553   PLT 222 09/09/2014 0553   MCV 84.6 09/09/2014 0553   MCH 29.6 09/09/2014 0553   MCHC 35.1 09/09/2014 0553   RDW 13.0 09/09/2014 0553   LYMPHSABS 2.7 08/10/2014 1420   MONOABS 0.6 08/10/2014 1420   EOSABS 0.1 08/10/2014 1420   BASOSABS 0.0 08/10/2014 1420   BMP Latest Ref Rng & Units 09/09/2014 08/31/2014 08/10/2014  Glucose 70 - 99 mg/dL 181(H) 217(H) 333(H)  BUN 6 - 23 mg/dL 14 11 10   Creatinine 0.50 - 1.35 mg/dL 1.02 0.96 1.15  Sodium 137 - 147 mEq/L 135(L) 136(L) 137  Potassium 3.7 - 5.3 mEq/L 4.5 4.8 4.2  Chloride 96 - 112 mEq/L 94(L) 99 96  CO2 19 - 32 mEq/L 28 21 28   Calcium 8.4 - 10.5 mg/dL 9.4 9.5 9.5   Chest imaging: CXR 12/21/21 Lungs appear without infiltrate or opacity. No pleural effusions.   PFT: No flowsheet data found.  Labs:  Path:  Echo:  Heart Catheterization:  Assessment & Plan:   Cough, unspecified type - Plan: DG Chest 2 View, fluticasone-salmeterol (ADVAIR HFA) 115-21 MCG/ACT inhaler  Post-nasal drainage - Plan: ipratropium (ATROVENT) 0.03 % nasal spray  Hypertension, unspecified type - Plan: Ambulatory referral to Cardiology  OSA (obstructive sleep apnea)  Discussion: Lance Frazier is a 61 year old male, never smoker with DMII, hypertension and  obstructive sleep apnea on cpap who is referred to pulmonary clinic for cough and wheezing.   Patient may have developed postviral reactive airways disease since his upper respiratory viral infection in late November.  He is to start Advair discus 250-50 MCG 1 puff twice daily and monitor for any improvement in his cough and shortness of breath.  Chest radiograph today appears within normal limits.  He has been on lisinopril for many years so we will consider transitioning off this medication if his cough  persists.  For the postnasal drainage she is to continue fluticasone nasal spray 1 spray twice daily and start ipratropium nasal spray 2 sprays per nostril twice daily.  We will refer patient to Dr. Einar Gip of [facility] cardiology for further assessment of his cardiovascular risk factors.  Patient was previously seen by Dr. Einar Gip for preop assessment.  Patient will be referred to one of our sleep specialists for further management of his obstructive sleep apnea.  Follow up in 6 weeks.  Freda Jackson, MD [facility] Office: [phone]   Current Outpatient Medications:    aspirin 81 MG tablet, Take 81 mg by mouth daily., Disp: , Rfl:    baclofen (LIORESAL) 10 MG tablet, Take 10 mg by mouth 3 (three) times daily., Disp: , Rfl:    canagliflozin (INVOKANA) 300 MG TABS tablet, Take 300 mg by mouth daily before breakfast., Disp: , Rfl:    cyclobenzaprine (FLEXERIL) 10 MG tablet, Take 10 mg by mouth daily as needed., Disp: , Rfl: 0   fluticasone-salmeterol (ADVAIR HFA) 115-21 MCG/ACT inhaler, Inhale 2 puffs into the lungs 2 (two) times daily., Disp: 1 each, Rfl: 12   glimepiride (AMARYL) 2 MG tablet, Take 2 mg by mouth daily., Disp: , Rfl: 3   glucose blood test strip, 1 each by Other route See admin instructions. Check blood sugar daily., Disp: , Rfl:    guaifenesin (ROBITUSSIN) 100 MG/5ML syrup, Take 10 mLs by mouth in the morning, at noon, and at bedtime., Disp: , Rfl:    ipratropium (ATROVENT) 0.03 % nasal spray, Place 2 sprays into both nostrils every 12 (twelve) hours., Disp: 30 mL, Rfl: 12   JARDIANCE 25 MG TABS tablet, Take 25 mg by mouth daily., Disp: , Rfl:    lisinopril (ZESTRIL) 20 MG tablet, Take 20 mg by mouth daily., Disp: , Rfl:    metFORMIN (GLUCOPHAGE) 1000 MG tablet, Take 1,000 mg by mouth 2 (two) times daily., Disp: , Rfl:    methocarbamol (ROBAXIN) 500 MG tablet, Take 1 tablet (500 mg total) by mouth 2 (two) times daily as needed for muscle spasms.,  Disp: 20 tablet, Rfl: 0   Nitroglycerin (RECTIV) 0.4 % OINT, Place 1 inch rectally 2 (two) times daily. For 3 weeks. Wear gloves when applying, wash hands thoroughly afterward., Disp: 1 Tube, Rfl: 1   pioglitazone (ACTOS) 30 MG tablet, Take 30 mg by mouth daily., Disp: , Rfl:    rosuvastatin (CRESTOR) 5 MG tablet, Take 5 mg by mouth daily., Disp: , Rfl:    sildenafil (VIAGRA) 100 MG tablet, Take 100 mg by mouth daily., Disp: , Rfl:    traMADol (ULTRAM) 50 MG tablet, Take 1 tablet (50 mg total) by mouth every 6 (six) hours as needed for severe pain., Disp: 12 tablet, Rfl: 0   TRULICITY 1.5 WG/9.5AO SOPN, Inject 1.5 mg into the skin once a week., Disp: , Rfl:

## 2021-12-21 NOTE — Patient Instructions (Addendum)
We will check a chest x-ray. Based on the x-ray results we will check a CT chest scan and possibly pulmonary function tests  Start advair HFA inhaler 2 puffs twice daily  Continue flonase 1 spray per nostril twice daily  Start ipratropium 2 sprays per nostril twice daily  We will consider switching off the lisinopril in the future if cough persists  We will refer you to Dr. Einar Gip of [facility] for further risk stratification of coronary artery disease  We will schedule you an appointment with our sleep specialists for further management of your sleep apnea

## 2022-01-30 ENCOUNTER — Ambulatory Visit: Payer: BC Managed Care – PPO | Admitting: Pulmonary Disease

## 2022-03-09 DIAGNOSIS — Z79899 Other long term (current) drug therapy: Secondary | ICD-10-CM | POA: Diagnosis not present

## 2022-03-09 DIAGNOSIS — E785 Hyperlipidemia, unspecified: Secondary | ICD-10-CM | POA: Diagnosis not present

## 2022-03-09 DIAGNOSIS — E118 Type 2 diabetes mellitus with unspecified complications: Secondary | ICD-10-CM | POA: Diagnosis not present

## 2022-03-15 DIAGNOSIS — R7309 Other abnormal glucose: Secondary | ICD-10-CM | POA: Diagnosis not present

## 2022-03-15 DIAGNOSIS — E1169 Type 2 diabetes mellitus with other specified complication: Secondary | ICD-10-CM | POA: Diagnosis not present

## 2022-03-15 DIAGNOSIS — E785 Hyperlipidemia, unspecified: Secondary | ICD-10-CM | POA: Diagnosis not present

## 2022-04-12 DIAGNOSIS — R972 Elevated prostate specific antigen [PSA]: Secondary | ICD-10-CM | POA: Diagnosis not present

## 2022-04-12 DIAGNOSIS — N400 Enlarged prostate without lower urinary tract symptoms: Secondary | ICD-10-CM | POA: Diagnosis not present

## 2022-05-17 ENCOUNTER — Ambulatory Visit: Payer: BC Managed Care – PPO | Admitting: Cardiology

## 2022-05-17 ENCOUNTER — Telehealth: Payer: Self-pay | Admitting: Cardiology

## 2022-05-17 ENCOUNTER — Encounter: Payer: Self-pay | Admitting: Cardiology

## 2022-05-17 VITALS — BP 122/84 | HR 82 | Temp 98.0°F | Resp 16 | Ht 65.0 in | Wt 261.0 lb

## 2022-05-17 DIAGNOSIS — E1165 Type 2 diabetes mellitus with hyperglycemia: Secondary | ICD-10-CM | POA: Diagnosis not present

## 2022-05-17 DIAGNOSIS — E7800 Pure hypercholesterolemia, unspecified: Secondary | ICD-10-CM

## 2022-05-17 DIAGNOSIS — Z794 Long term (current) use of insulin: Secondary | ICD-10-CM | POA: Diagnosis not present

## 2022-05-17 DIAGNOSIS — R072 Precordial pain: Secondary | ICD-10-CM | POA: Diagnosis not present

## 2022-05-17 MED ORDER — LOSARTAN POTASSIUM 50 MG PO TABS: 50.0000 mg | ORAL_TABLET | Freq: Every evening | ORAL | 3 refills | Status: DC

## 2022-05-17 NOTE — Telephone Encounter (Signed)
Patient seen in the office 05/17/22

## 2022-05-17 NOTE — Progress Notes (Signed)
Primary Physician/Referring:  Asencion Noble, MD  Patient ID: Lance Frazier, male    DOB: April 29, 1961, 61 y.o.   MRN: [id number]  Chief Complaint  Patient presents with   Chest Pain   New Patient (Initial Visit)   HPI:    Lance Frazier  is a 61 y.o. Caucasian male patient with diabetes mellitus with hyperglycemia, OSA on CPAP and compliant, hyperlipidemia, primary hypertension, chronic back pain, has spinal stimulator placed sometime in 2015, referred back to me for evaluation of preoperative cardiac risk stratification in view of upcoming prostate biopsy/prostatectomy for elevated PSA.  Although patient has no symptoms or chest pain or dyspnea on exertion, he has been extremely sedentary due to back pain.  His last cardiac work-up was about 8 years ago.   Past Medical History:  Diagnosis Date   Anxiety    Arthritis    Chest pain, precordial 05/27/2012   Myocardial infarction ruled out in 05/2012; atypical chest discomfort, most likely of musculoskeletal origin.    Diabetes mellitus    Fibromyalgia    Hyperlipidemia 05/28/2012   Hypertension    Marijuana use    Obesity    BMI of 40.4 at a weight of 265 pounds   Obstructive sleep apnea 1989   CPAP treatment since 1989; status post uvulectomy   Past Surgical History:  Procedure Laterality Date   COLONOSCOPY  11/2009   Dr. Gala Romney: single external hemorrhoid tag, anal papilla, normal TI   HEMORRHOID SURGERY  02/06/2012   Procedure: HEMORRHOIDECTOMY;  Surgeon: Jamesetta So, MD;  Location: [facility];  Service: General;  Laterality: N/A;   HEMORRHOID SURGERY N/A 08/13/2014   Procedure: EXTENSIVE HEMORRHOIDECTOMY;  Surgeon: Jamesetta So, MD;  Location: [facility];  Service: General;  Laterality: N/A;   HERNIA REPAIR     [facility] ARTHROS[facility]     right x2   LUMBAR FUSION  Sep 08, 2014   TONSILLECTOMY     UVULECTOMY     Family History  Problem Relation Age of Onset   Heart disease Mother    Coronary artery disease Father 77        Acute MI   Heart disease Maternal Aunt    Anesthesia problems Neg Hx    Hypotension Neg Hx    Malignant hyperthermia Neg Hx    Pseudochol deficiency Neg Hx    Colon cancer Neg Hx     Social History   Tobacco Use   Smoking status: Never   Smokeless tobacco: Never  Substance Use Topics   Alcohol use: No    Alcohol/week: 0.0 standard drinks of alcohol   Marital Status: Married  ROS  Review of Systems  Cardiovascular:  Negative for chest pain, dyspnea on exertion and leg swelling.  Respiratory:  Positive for cough.   Musculoskeletal:  Positive for back pain.   Objective  Blood pressure 122/84, pulse 82, temperature 98 F (36.7 C), resp. rate 16, height '5\' 5"'$  (1.651 m), weight 261 lb (118.4 kg), SpO2 95 %. Body mass index is 43.43 kg/m.     05/17/2022    9:09 AM 12/21/2021    9:27 AM 05/26/2021    2:05 PM  Vitals with BMI  Height '5\' 5"'$  '5\' 7"'$    Weight 261 lbs 260 lbs 6 oz   BMI 72.53 66.44   Systolic 034 742 595  Diastolic 84 82 88  Pulse 82 80 70    Physical Exam Constitutional:      Appearance: He  is morbidly obese.  Neck:     Vascular: No JVD.  Cardiovascular:     Rate and Rhythm: Normal rate and regular rhythm.     Pulses: Intact distal pulses.     Heart sounds: Normal heart sounds. No murmur heard.    No gallop.  Pulmonary:     Effort: Pulmonary effort is normal.     Breath sounds: Normal breath sounds.  Abdominal:     General: Bowel sounds are normal.     Palpations: Abdomen is soft.  Musculoskeletal:     Right lower leg: No edema.     Left lower leg: No edema.     Medications and allergies  No Known Allergies   Medication list after today's encounter   Current Outpatient Medications:    baclofen (LIORESAL) 10 MG tablet, Take 10 mg by mouth 3 (three) times daily., Disp: , Rfl:    canagliflozin (INVOKANA) 300 MG TABS tablet, Take 300 mg by mouth daily before breakfast., Disp: , Rfl:    glimepiride (AMARYL) 4 MG tablet, Take 4 mg by mouth daily.,  Disp: , Rfl: 3   glucose blood test strip, 1 each by Other route See admin instructions. Check blood sugar daily., Disp: , Rfl:    JARDIANCE 25 MG TABS tablet, Take 25 mg by mouth daily., Disp: , Rfl:    losartan (COZAAR) 50 MG tablet, Take 1 tablet (50 mg total) by mouth every evening., Disp: 90 tablet, Rfl: 3   metFORMIN (GLUCOPHAGE) 1000 MG tablet, Take 1,000 mg by mouth 2 (two) times daily., Disp: , Rfl:    pioglitazone (ACTOS) 30 MG tablet, Take 30 mg by mouth daily., Disp: , Rfl:    TRULICITY 1.5 GP/4.9IY SOPN, Inject 1.5 mg into the skin once a week., Disp: , Rfl:   Laboratory examination:   External labs:   Cholesterol, total 203.000 M 03/09/2022 HDL 30.000 MG 03/09/2022 LDL 112.000 M 03/09/2022 Triglycerides 353.000 M 03/09/2022  A1C 7.500 % 03/09/2022  Hemoglobin 17.700 G/ 12/06/2021 Platelets 199.000 X1 12/06/2021  Creatinine, Serum 1.150 MG/ 12/06/2021 ALT (SGPT) 22.000 IU/ 03/09/2022  Radiology:    Cardiac Studies:   No results found for this or any previous visit from the past 1095 days.     No results found for this or any previous visit from the past 1095 days.   Echo- 10/26/14 - Left ventricle cavity is normal in size. Normal global wall motion. Doppler evidence of grade I (impaired) diastolic dysfunction. Calculated EF 65%. - Mild tricuspid regurgitation. No evidence of pulmonary hypertension. - The aortic root is mildly dilated. Mildly dilated ascending aorta at 4.1 cm.  Lexiscan sestamibi stress test 09/06/2014: 1. Resting EKG demonstrates normal sinus rhythm, low voltage complexes, poor R-wave progression, cannot exclude inferior infarct old.  Stress EKG was nondiagnostic for ischemia as a pharmacologic stress test.  Stress symptoms included dyspnea, headache and nausea. 2. The left ventricle was normal in size both at rest and with stress images.  Perfusion imaging study demonstrates mild apical thinning, a small sized insignificant inferoapical ischemia cannot  be completely excluded.  Left ventricle systolic function calculated by QGS was 69%.   This represents a low risk study.  Based on the study patient can be taken up for upcoming surgery with acceptable cardiovascular risk.  EKG:   EKG 05/17/2022: Normal sinus rhythm at rate of 75 bpm, rightward axis, cannot exclude inferior infarct old.  Incomplete right bundle branch block.  PAC.  Low-voltage complexes.    Assessment  ICD-10-CM   1. Precordial pain  R07.2 EKG 12-Lead    PCV ECHOCARDIOGRAM COMPLETE    PCV MYOCARDIAL PERFUSION WITH LEXISCAN    CT CARDIAC SCORING ([facility] LOCATIONS ONLY)    2. Type 2 diabetes mellitus with hyperglycemia, with long-term current use of insulin (HCC)  E11.65 losartan (COZAAR) 50 MG tablet   Z79.4     3. Pure hypercholesterolemia  E78.00        Medications Discontinued During This Encounter  Medication Reason   aspirin 81 MG tablet    cyclobenzaprine (FLEXERIL) 10 MG tablet    fluticasone-salmeterol (ADVAIR HFA) 115-21 MCG/ACT inhaler    ipratropium (ATROVENT) 0.03 % nasal spray    guaifenesin (ROBITUSSIN) 100 MG/5ML syrup    methocarbamol (ROBAXIN) 500 MG tablet    Nitroglycerin (RECTIV) 0.4 % OINT    rosuvastatin (CRESTOR) 5 MG tablet    sildenafil (VIAGRA) 100 MG tablet    traMADol (ULTRAM) 50 MG tablet    lisinopril (ZESTRIL) 10 MG tablet Discontinued by provider   lisinopril (ZESTRIL) 20 MG tablet Change in therapy    Meds ordered this encounter  Medications   losartan (COZAAR) 50 MG tablet    Sig: Take 1 tablet (50 mg total) by mouth every evening.    Dispense:  90 tablet    Refill:  3   Orders Placed This Encounter  Procedures   CT CARDIAC SCORING ([facility] LOCATIONS ONLY)    Standing Status:   Future    Standing Expiration Date:   07/17/2022    Order Specific Question:   Preferred imaging location?    Answer:   [facility]   PCV MYOCARDIAL PERFUSION WITH LEXISCAN    Standing Status:   Future    Standing Expiration Date:   07/17/2022   EKG  12-Lead   PCV ECHOCARDIOGRAM COMPLETE    Standing Status:   Future    Standing Expiration Date:   05/18/2023   Recommendations:   CHRISTY FRIEDE is a 61 y.o. Caucasian male patient with diabetes mellitus with hyperglycemia, OSA on CPAP and compliant, hyperlipidemia, primary hypertension, chronic back pain, has spinal stimulator placed sometime in 2015, referred back to me for evaluation of preoperative cardiac risk stratification in view of upcoming prostate biopsy/prostatectomy for elevated PSA.  Although patient has no symptoms or chest pain or dyspnea on exertion, he has been extremely sedentary due to back pain.  His last cardiac work-up was about 8 years ago.  In view of uncontrolled diabetes, morbid obesity, hyperlipidemia which is not on any therapy for now, I have recommended that we obtain Lexiscan nuclear stress test along with an echocardiogram.  In 2015 he had mild aortic root dilatation at 4.1 cm as well, this needs follow-up as well.  With regard to hyperlipidemia, although lipids are not markedly elevated, I would like to further restratify him by performing a coronary calcium score.  I did not make any changes to his medications.  Morbid obesity was discussed, water aerobics discussed which would help in both weight loss and presently on Trulicity and dosage has been increased recently to 3 mg and is awaiting to pick up the medication.  It would be a great idea to continue to uptitrate this to the maximum tolerated dose both for diabetes control and weight loss.  With regard to hypertension and also chronic cough, he is presently on lisinopril, although the cough does not appear to be lisinopril related I would like to see whether if his cough would improve by  changing it to losartan 50 mg in the evening.  I would like to see him back in the next 3 weeks or so prior to his scheduled prostate biopsy in 4 to 5 weeks and I will make my final recommendations.    Adrian Prows, MD,  [facility] 05/17/2022, 10:21 AM Office: [phone]

## 2022-05-25 ENCOUNTER — Ambulatory Visit
Admission: RE | Admit: 2022-05-25 | Discharge: 2022-05-25 | Disposition: A | Discharge status: Home / Self Care | Payer: BC Managed Care – PPO | Source: Ambulatory Visit | Attending: Cardiology | Admitting: Cardiology

## 2022-05-25 DIAGNOSIS — R072 Precordial pain: Secondary | ICD-10-CM

## 2022-05-25 DIAGNOSIS — I2510 Atherosclerotic heart disease of native coronary artery without angina pectoris: Secondary | ICD-10-CM | POA: Diagnosis not present

## 2022-05-29 NOTE — Progress Notes (Signed)
Coronary calcium score 05/27/2022: LM 0 LAD 265 LCx 6.6 RCA 232 Total agate stent score 503. MESA database percentile: 88 Ascending aorta mildly dilated at 43 mm.  Ascending aorta is normal at 29.  Scattered aortic calcification is evident.  Recommend annual screening.

## 2022-05-30 ENCOUNTER — Ambulatory Visit: Payer: BC Managed Care – PPO

## 2022-05-30 DIAGNOSIS — R072 Precordial pain: Secondary | ICD-10-CM | POA: Diagnosis not present

## 2022-06-06 DIAGNOSIS — H5213 Myopia, bilateral: Secondary | ICD-10-CM | POA: Diagnosis not present

## 2022-06-07 ENCOUNTER — Ambulatory Visit: Payer: BC Managed Care – PPO

## 2022-06-07 DIAGNOSIS — R072 Precordial pain: Secondary | ICD-10-CM | POA: Diagnosis not present

## 2022-06-08 DIAGNOSIS — C61 Malignant neoplasm of prostate: Secondary | ICD-10-CM

## 2022-06-08 HISTORY — DX: Malignant neoplasm of prostate: C61

## 2022-06-11 DIAGNOSIS — E118 Type 2 diabetes mellitus with unspecified complications: Secondary | ICD-10-CM | POA: Diagnosis not present

## 2022-06-12 DIAGNOSIS — C61 Malignant neoplasm of prostate: Secondary | ICD-10-CM | POA: Diagnosis not present

## 2022-06-12 DIAGNOSIS — N4289 Other specified disorders of prostate: Secondary | ICD-10-CM | POA: Diagnosis not present

## 2022-06-12 DIAGNOSIS — N410 Acute prostatitis: Secondary | ICD-10-CM | POA: Diagnosis not present

## 2022-06-12 DIAGNOSIS — R972 Elevated prostate specific antigen [PSA]: Secondary | ICD-10-CM | POA: Diagnosis not present

## 2022-06-18 ENCOUNTER — Ambulatory Visit: Payer: BC Managed Care – PPO | Admitting: Cardiology

## 2022-06-18 ENCOUNTER — Encounter: Payer: Self-pay | Admitting: Cardiology

## 2022-06-18 VITALS — BP 153/86 | HR 73 | Temp 98.0°F | Resp 16 | Ht 65.0 in | Wt 258.8 lb

## 2022-06-18 DIAGNOSIS — R931 Abnormal findings on diagnostic imaging of heart and coronary circulation: Secondary | ICD-10-CM

## 2022-06-18 DIAGNOSIS — C61 Malignant neoplasm of prostate: Secondary | ICD-10-CM | POA: Diagnosis not present

## 2022-06-18 DIAGNOSIS — E7800 Pure hypercholesterolemia, unspecified: Secondary | ICD-10-CM | POA: Diagnosis not present

## 2022-06-18 DIAGNOSIS — I10 Essential (primary) hypertension: Secondary | ICD-10-CM | POA: Diagnosis not present

## 2022-06-18 DIAGNOSIS — I7121 Aneurysm of the ascending aorta, without rupture: Secondary | ICD-10-CM

## 2022-06-18 DIAGNOSIS — E1169 Type 2 diabetes mellitus with other specified complication: Secondary | ICD-10-CM | POA: Diagnosis not present

## 2022-06-18 DIAGNOSIS — M545 Low back pain, unspecified: Secondary | ICD-10-CM | POA: Diagnosis not present

## 2022-06-18 MED ORDER — ROSUVASTATIN CALCIUM 20 MG PO TABS: 20.0000 mg | ORAL_TABLET | Freq: Every day | ORAL | 3 refills | Status: DC

## 2022-06-18 MED ORDER — LOSARTAN POTASSIUM 50 MG PO TABS: 50.0000 mg | ORAL_TABLET | Freq: Every evening | ORAL | 3 refills | Status: DC

## 2022-06-18 NOTE — Progress Notes (Unsigned)
Primary Physician/Referring:  Asencion Noble, MD  Patient ID: Lance Frazier, male    DOB: 1961/07/22, 61 y.o.   MRN: [id number]  Chief Complaint  Patient presents with   Chest Pain   Pre-op Exam   HPI:    Lance Frazier  is a 61 y.o. Caucasian male patient with diabetes mellitus with hyperglycemia, OSA on CPAP and compliant, hyperlipidemia, primary hypertension, chronic back pain, has spinal stimulator placed sometime in 2015, was evaluated by me for preop cardiovascular risk ratification for upcoming prostate biopsy.  Although patient has no symptoms or chest pain or dyspnea on exertion, he has been extremely sedentary due to back pain.  Unfortunately has been diagnosed with prostate cancer and has been recommended either radiation therapy or seed implantation by his urologist at [facility].  Today he is in severe back pain although has spinal stimulator, states that it is not working well.  Denies chest pain or dyspnea.  Accompanied by his wife.  Past Medical History:  Diagnosis Date   Anxiety    Arthritis    Fibromyalgia    Hyperlipidemia 05/28/2012   Hypertension    Marijuana use    Obesity    BMI of 40.4 at a weight of 265 pounds   Prostate cancer ([place]) 06/08/2022   Past Surgical History:  Procedure Laterality Date   COLONOSCOPY  11/2009   Dr. Gala Romney: single external hemorrhoid tag, anal papilla, normal TI   HEMORRHOID SURGERY  02/06/2012   Procedure: HEMORRHOIDECTOMY;  Surgeon: Jamesetta So, MD;  Location: [facility];  Service: General;  Laterality: N/A;   HEMORRHOID SURGERY N/A 08/13/2014   Procedure: EXTENSIVE HEMORRHOIDECTOMY;  Surgeon: Jamesetta So, MD;  Location: [facility];  Service: General;  Laterality: N/A;   HERNIA REPAIR     [place] ARTHROSCOPY     right x2   LUMBAR FUSION  Sep 08, 2014   TONSILLECTOMY     UVULECTOMY     Family History  Problem Relation Age of Onset   Heart disease Mother    Coronary artery disease Father 62       Acute MI   Heart  disease Maternal Aunt    Anesthesia problems Neg Hx    Hypotension Neg Hx    Malignant hyperthermia Neg Hx    Pseudochol deficiency Neg Hx    Colon cancer Neg Hx     Social History   Tobacco Use   Smoking status: Never   Smokeless tobacco: Never  Substance Use Topics   Alcohol use: No    Alcohol/week: 0.0 standard drinks of alcohol   Marital Status: Married  ROS  Review of Systems  Cardiovascular:  Negative for chest pain, dyspnea on exertion and leg swelling.  Respiratory:  Positive for cough.   Musculoskeletal:  Positive for back pain.   Objective  Blood pressure (!) 153/86, pulse 73, temperature 98 F (36.7 C), temperature source Temporal, resp. rate 16, height '5\' 5"'$  (1.651 m), weight 258 lb 12.8 oz (117.4 kg), SpO2 96 %. Body mass index is 43.07 kg/m.     06/18/2022    8:46 AM 05/17/2022    9:09 AM 12/21/2021    9:27 AM  Vitals with BMI  Height '5\' 5"'$  '5\' 5"'$  '5\' 7"'$   Weight 258 lbs 13 oz 261 lbs 260 lbs 6 oz  BMI 43.07 40.98 11.91  Systolic 478 295 621  Diastolic 86 84 82  Pulse 73 82 80    Physical Exam Constitutional:  Appearance: He is morbidly obese.  Neck:     Vascular: No JVD.  Cardiovascular:     Rate and Rhythm: Normal rate and regular rhythm.     Pulses: Intact distal pulses.     Heart sounds: Normal heart sounds. No murmur heard.    No gallop.  Pulmonary:     Effort: Pulmonary effort is normal.     Breath sounds: Normal breath sounds.  Abdominal:     General: Bowel sounds are normal.     Palpations: Abdomen is soft.  Musculoskeletal:     Right lower leg: No edema.     Left lower leg: No edema.     Medications and allergies   Allergies  Allergen Reactions   Lisinopril Cough     Medication list after today's encounter   Current Outpatient Medications:    baclofen (LIORESAL) 10 MG tablet, Take 10 mg by mouth 3 (three) times daily., Disp: , Rfl:    glimepiride (AMARYL) 4 MG tablet, Take 4 mg by mouth daily., Disp: , Rfl: 3   glucose  blood test strip, 1 each by Other route See admin instructions. Check blood sugar daily., Disp: , Rfl:    JARDIANCE 25 MG TABS tablet, Take 25 mg by mouth daily., Disp: , Rfl:    losartan (COZAAR) 50 MG tablet, Take 1 tablet (50 mg total) by mouth every evening., Disp: 90 tablet, Rfl: 3   metFORMIN (GLUCOPHAGE) 1000 MG tablet, Take 1,000 mg by mouth daily with breakfast., Disp: , Rfl:    pioglitazone (ACTOS) 30 MG tablet, Take 30 mg by mouth daily., Disp: , Rfl:    rosuvastatin (CRESTOR) 20 MG tablet, Take 1 tablet (20 mg total) by mouth daily., Disp: 90 tablet, Rfl: 3   sildenafil (VIAGRA) 100 MG tablet, Take 1 tablet by mouth as needed., Disp: , Rfl:    tamsulosin (FLOMAX) 0.4 MG CAPS capsule, Take 0.4 mg by mouth daily., Disp: , Rfl:    TRULICITY 1.5 SW/1.0XN SOPN, Inject 1.5 mg into the skin once a week., Disp: , Rfl:   Laboratory examination:   External labs:   Cholesterol, total 203.000 M 03/09/2022 HDL 30.000 MG 03/09/2022 LDL 112.000 M 03/09/2022 Triglycerides 353.000 M 03/09/2022  A1C 7.500 % 03/09/2022  Hemoglobin 17.700 G/ 12/06/2021 Platelets 199.000 X1 12/06/2021  Creatinine, Serum 1.150 MG/ 12/06/2021 ALT (SGPT) 22.000 IU/ 03/09/2022  Radiology:   Coronary calcium score 05/27/2022: LM 0 LAD 265 LCx 6.6 RCA 232 Total Agatston score 503. MESA database percentile: 88 Ascending aorta mildly dilated at 43 mm.  Descending aorta is normal at 29.  Scattered aortic calcification is evident.  Recommend annual screening.  Cardiac Studies:   PCV ECHOCARDIOGRAM COMPLETE 06/07/2022  Narrative Echocardiogram 06/07/2022: Normal LV systolic function with visual EF 60-65%. Left ventricle cavity is normal in size. Normal left ventricular wall thickness. Normal global wall motion. Normal diastolic filling pattern, normal LAP. No significant valvular heart disease. The aortic root is normal. Dilated proximal ascending aorta 66m. No prior study for comparison.    PCV MYOCARDIAL  PERFUSION WITH LEXISCAN 05/30/2022  Narrative Exercise Sestamibi stress test 05/30/2022: Exercise nuclear stress test was performed using Bruce protocol. 1 Day Rest and Stress images. Exercise time 7 minutes 2 seconds, achieved 8.62 METS, 89% APMHR. Stress ECG negative for ischemia. Normal myocardial perfusion with apical thinning artifact (normal variant), no obvious evidence of reversible myocardial ischemia or prior infarct. Left ventricular size preserved, calculated LVEF 71%, no regional wall motion abnormalities. Prior study 09/06/2014 reported  normal myocardial perfusion with apical thinning and small size insignificant inferoapical ischemia cannot be excluded. Low risk study.    EKG:   EKG 05/17/2022: Normal sinus rhythm at rate of 75 bpm, rightward axis, cannot exclude inferior infarct old.  Incomplete right bundle branch block.  PAC.  Low-voltage complexes.    Assessment     ICD-10-CM   1. Aneurysm of ascending aorta without rupture (HCC)  I71.21 losartan (COZAAR) 50 MG tablet    PCV ECHOCARDIOGRAM COMPLETE    2. Elevated coronary artery calcium score 05/27/2022: Total Agatston score 503. MESA database percentile: 88  R93.1     3. Pure hypercholesterolemia  E78.00     4. Hypercholesteremia  E78.00 rosuvastatin (CRESTOR) 20 MG tablet    5. Primary hypertension  I10 losartan (COZAAR) 50 MG tablet       Medications Discontinued During This Encounter  Medication Reason   canagliflozin (INVOKANA) 300 MG TABS tablet    losartan (COZAAR) 50 MG tablet      Meds ordered this encounter  Medications   rosuvastatin (CRESTOR) 20 MG tablet    Sig: Take 1 tablet (20 mg total) by mouth daily.    Dispense:  90 tablet    Refill:  3   losartan (COZAAR) 50 MG tablet    Sig: Take 1 tablet (50 mg total) by mouth every evening.    Dispense:  90 tablet    Refill:  3   Orders Placed This Encounter  Procedures   PCV ECHOCARDIOGRAM COMPLETE    Standing Status:   Future     Standing Expiration Date:   06/19/2023   Recommendations:   Lance Frazier is a 61 y.o. Caucasian male patient with diabetes mellitus with hyperglycemia, OSA on CPAP and compliant, hyperlipidemia, primary hypertension, chronic back pain, has spinal stimulator placed sometime in 2015, was evaluated by me for preop cardiovascular risk ratification for upcoming prostate biopsy.  Although patient has no symptoms or chest pain or dyspnea on exertion, he has been extremely sedentary due to back pain.  [facility] from [facility], now has been diagnosed with prostate cancer which is fairly aggressive in young individuals, has been recommended seed implantation versus radiation therapy.  There is still awaiting complete urologic evaluation.  I reviewed the results of the stress test and echocardiogram which revealed no significant ischemia and normal LVEF.  He has mild to moderate aortic root dilatation which needs follow-up, confirmed and correlates in size with both CT and echocardiogram.  Annual echocardiogram and or CT scan of the chest.  His last office visit he was supposed to have started losartan however Rx was not sent which I will do today.  Blood pressure is elevated due to acute exacerbation his back pain and he is also anxious about recent diagnosis of prostate cancer.  I reviewed his lipid status, although mild hyperlipidemia, coronary calcium score the 88th percentile.  Hence needs statin therapy, start him on Crestor 20 mg daily will also probably help with triglyceride elevation.  We also discussed, in view of his high coronary calcium score, he is presently on aspirin but can certainly hold if he needs any surgical invention.  Would like to see him back in 6 months for follow-up.  We will repeat lipids prior to his next office visit which she will obtain from his PCP, would also recommend testing is vitamin D levels and TSH if not done.    Adrian Prows, MD, [facility] 06/19/2022, 10:27  PM Office: [phone]

## 2022-06-18 NOTE — Patient Instructions (Addendum)
Please make sure your vitamin D levels are checked  In 2 to 3 months he will need your cholesterol checked  In 6 months I am repeating your echocardiogram to follow-up on ascending aortic aneurysm.  Ask about Lyrica with your PCP regarding chronic pain  I will continue to follow your progress with regard to the prostate issues on MyChart.

## 2022-06-19 ENCOUNTER — Encounter: Payer: Self-pay | Admitting: Cardiology

## 2022-07-04 ENCOUNTER — Telehealth: Payer: Self-pay | Admitting: *Deleted

## 2022-07-04 NOTE — Chronic Care Management (AMB) (Signed)
  Care Coordination  Outreach Note  07/04/2022 Name: Lance Frazier MRN: [id number] DOB: 1961/11/14   Care Coordination Outreach Attempts  An unsuccessful telephone outreach was attempted today to offer the patient information about available care coordination services as a benefit of their health plan.   Follow Up Plan:  Additional outreach attempts will be made to offer the patient care coordination information and services.   Encounter Outcome:  No Answer  [place]  Direct Dial: [phone]

## 2022-07-04 NOTE — Chronic Care Management (AMB) (Signed)
  Care Coordination   Note   07/04/2022 Name: Lance Frazier MRN: [id number] DOB: 1961/05/15  Lance Frazier is a 61 y.o. year old male who sees Asencion Noble, MD for primary care. I reached out to [name] by phone today to offer care coordination services.  [name] was given information about Care Coordination services today including:   The Care Coordination services include support from the care team which includes your Nurse Coordinator, Clinical Social Worker, or Pharmacist.  The Care Coordination team is here to help remove barriers to the health concerns and goals most important to you. Care Coordination services are voluntary, and the patient may decline or stop services at any time by request to their care team member.   Care Coordination Consent Status: Patient did not agree to participate in care coordination services at this time.    Encounter Outcome:  Pt. Refused  [place]  Direct Dial: [phone]

## 2022-07-09 DIAGNOSIS — C61 Malignant neoplasm of prostate: Secondary | ICD-10-CM | POA: Diagnosis not present

## 2022-07-16 DIAGNOSIS — C61 Malignant neoplasm of prostate: Secondary | ICD-10-CM | POA: Diagnosis not present

## 2022-08-01 DIAGNOSIS — G4733 Obstructive sleep apnea (adult) (pediatric): Secondary | ICD-10-CM | POA: Diagnosis not present

## 2022-08-01 DIAGNOSIS — C61 Malignant neoplasm of prostate: Secondary | ICD-10-CM | POA: Diagnosis not present

## 2022-08-01 DIAGNOSIS — Z7985 Long-term (current) use of injectable non-insulin antidiabetic drugs: Secondary | ICD-10-CM | POA: Diagnosis not present

## 2022-08-01 DIAGNOSIS — E119 Type 2 diabetes mellitus without complications: Secondary | ICD-10-CM | POA: Diagnosis not present

## 2022-08-15 DIAGNOSIS — C61 Malignant neoplasm of prostate: Secondary | ICD-10-CM | POA: Diagnosis not present

## 2022-08-20 DIAGNOSIS — C61 Malignant neoplasm of prostate: Secondary | ICD-10-CM | POA: Diagnosis not present

## 2022-08-20 DIAGNOSIS — Z510 Encounter for antineoplastic radiation therapy: Secondary | ICD-10-CM | POA: Diagnosis not present

## 2022-09-03 DIAGNOSIS — C61 Malignant neoplasm of prostate: Secondary | ICD-10-CM | POA: Diagnosis not present

## 2022-09-03 DIAGNOSIS — Z510 Encounter for antineoplastic radiation therapy: Secondary | ICD-10-CM | POA: Diagnosis not present

## 2022-09-05 DIAGNOSIS — Z510 Encounter for antineoplastic radiation therapy: Secondary | ICD-10-CM | POA: Diagnosis not present

## 2022-09-05 DIAGNOSIS — C61 Malignant neoplasm of prostate: Secondary | ICD-10-CM | POA: Diagnosis not present

## 2022-09-07 DIAGNOSIS — Z510 Encounter for antineoplastic radiation therapy: Secondary | ICD-10-CM | POA: Diagnosis not present

## 2022-09-07 DIAGNOSIS — C61 Malignant neoplasm of prostate: Secondary | ICD-10-CM | POA: Diagnosis not present

## 2022-09-10 DIAGNOSIS — Z510 Encounter for antineoplastic radiation therapy: Secondary | ICD-10-CM | POA: Diagnosis not present

## 2022-09-10 DIAGNOSIS — C61 Malignant neoplasm of prostate: Secondary | ICD-10-CM | POA: Diagnosis not present

## 2022-09-12 DIAGNOSIS — Z510 Encounter for antineoplastic radiation therapy: Secondary | ICD-10-CM | POA: Diagnosis not present

## 2022-09-12 DIAGNOSIS — C61 Malignant neoplasm of prostate: Secondary | ICD-10-CM | POA: Diagnosis not present

## 2022-09-14 DIAGNOSIS — I10 Essential (primary) hypertension: Secondary | ICD-10-CM | POA: Diagnosis not present

## 2022-09-14 DIAGNOSIS — I2510 Atherosclerotic heart disease of native coronary artery without angina pectoris: Secondary | ICD-10-CM | POA: Diagnosis not present

## 2022-09-14 DIAGNOSIS — E118 Type 2 diabetes mellitus with unspecified complications: Secondary | ICD-10-CM | POA: Diagnosis not present

## 2022-09-19 DIAGNOSIS — E1140 Type 2 diabetes mellitus with diabetic neuropathy, unspecified: Secondary | ICD-10-CM | POA: Diagnosis not present

## 2022-09-19 DIAGNOSIS — I10 Essential (primary) hypertension: Secondary | ICD-10-CM | POA: Diagnosis not present

## 2022-09-19 DIAGNOSIS — M5090 Cervical disc disorder, unspecified, unspecified cervical region: Secondary | ICD-10-CM | POA: Diagnosis not present

## 2022-09-19 DIAGNOSIS — R7309 Other abnormal glucose: Secondary | ICD-10-CM | POA: Diagnosis not present

## 2022-11-22 DIAGNOSIS — C61 Malignant neoplasm of prostate: Secondary | ICD-10-CM | POA: Diagnosis not present

## 2022-11-22 DIAGNOSIS — R972 Elevated prostate specific antigen [PSA]: Secondary | ICD-10-CM | POA: Diagnosis not present

## 2022-12-10 DIAGNOSIS — M13842 Other specified arthritis, left hand: Secondary | ICD-10-CM | POA: Diagnosis not present

## 2022-12-10 DIAGNOSIS — R52 Pain, unspecified: Secondary | ICD-10-CM | POA: Diagnosis not present

## 2022-12-10 DIAGNOSIS — M79645 Pain in left finger(s): Secondary | ICD-10-CM | POA: Diagnosis not present

## 2022-12-10 DIAGNOSIS — M1812 Unilateral primary osteoarthritis of first carpometacarpal joint, left hand: Secondary | ICD-10-CM | POA: Diagnosis not present

## 2022-12-17 ENCOUNTER — Ambulatory Visit: Payer: BC Managed Care – PPO

## 2022-12-17 DIAGNOSIS — I7121 Aneurysm of the ascending aorta, without rupture: Secondary | ICD-10-CM

## 2022-12-17 DIAGNOSIS — I10 Essential (primary) hypertension: Secondary | ICD-10-CM | POA: Diagnosis not present

## 2022-12-19 DIAGNOSIS — E118 Type 2 diabetes mellitus with unspecified complications: Secondary | ICD-10-CM | POA: Diagnosis not present

## 2022-12-21 ENCOUNTER — Encounter: Payer: Self-pay | Admitting: Cardiology

## 2022-12-21 ENCOUNTER — Ambulatory Visit: Payer: BC Managed Care – PPO | Admitting: Cardiology

## 2022-12-21 VITALS — BP 126/87 | HR 83 | Ht 65.0 in | Wt 264.8 lb

## 2022-12-21 DIAGNOSIS — I10 Essential (primary) hypertension: Secondary | ICD-10-CM

## 2022-12-21 DIAGNOSIS — E7800 Pure hypercholesterolemia, unspecified: Secondary | ICD-10-CM

## 2022-12-21 DIAGNOSIS — I7121 Aneurysm of the ascending aorta, without rupture: Secondary | ICD-10-CM | POA: Diagnosis not present

## 2022-12-21 DIAGNOSIS — N5235 Erectile dysfunction following radiation therapy: Secondary | ICD-10-CM | POA: Diagnosis not present

## 2022-12-21 DIAGNOSIS — R931 Abnormal findings on diagnostic imaging of heart and coronary circulation: Secondary | ICD-10-CM

## 2022-12-21 MED ORDER — TADALAFIL 20 MG PO TABS: 20.0000 mg | ORAL_TABLET | Freq: Every day | ORAL | 1 refills | Status: DC | PRN

## 2022-12-21 MED ORDER — ASPIRIN 81 MG PO CHEW: 81.0000 mg | CHEWABLE_TABLET | Freq: Every day | ORAL | Status: AC

## 2022-12-21 NOTE — Progress Notes (Unsigned)
Primary Physician/Referring:  Asencion Noble, MD  Patient ID: Lance Frazier, male    DOB: [birth year], 63 y.o.   MRN: [id number]  Chief Complaint  Patient presents with   Aneurysm of ascending aorta without rupture   Follow-up   Results   HPI:    Lance Frazier  is a 62 y.o. Caucasian male patient with diabetes mellitus with hyperglycemia, OSA on CPAP and compliant, hyperlipidemia, primary hypertension, chronic back pain, has spinal stimulator placed sometime in 2015, diagnosis of prostate cancer for which he underwent radiation therapy in 2023 and being followed at [facility], now follows Lance Frazier.  This is a 56-monthoffice visit.  Except for chronic back pain, leg pain, he has no specific complaints.  He has noticed erectile dysfunction since radiation therapy.  Past Medical History:  Diagnosis Date   Anxiety    Arthritis    Fibromyalgia    Hyperlipidemia 05/28/2012   Hypertension    Marijuana use    Obesity    BMI of 40.4 at a weight of 265 pounds   [place] cancer [place] 06/08/2022   [facility]    Past Surgical History:  Procedure Laterality Date   COLONOSCOPY  11/2009   Dr. RGala Romney single external hemorrhoid tag, anal papilla, normal TI   HEMORRHOID SURGERY  02/06/2012   Procedure: HEMORRHOIDECTOMY;  Surgeon: MJamesetta So MD;  Location: [facility];  Service: General;  Laterality: N/A;   HEMORRHOID SURGERY N/A 08/13/2014   Procedure: EXTENSIVE HEMORRHOIDECTOMY;  Surgeon: MJamesetta So MD;  Location: [facility];  Service: General;  Laterality: N/A;   HERNIA REPAIR     [place]     right x2   LUMBAR FUSION  Sep 08, 2014   TONSILLECTOMY     UVULECTOMY     Family History  Problem Relation Age of Onset   Heart disease Mother    Coronary artery disease Father 563      Acute MI   Heart disease Maternal Aunt    Anesthesia problems Neg Hx    Hypotension Neg Hx    Malignant hyperthermia Neg Hx    Pseudochol deficiency Neg Hx    Colon cancer  Neg Hx     Social History   Tobacco Use   Smoking status: Never   Smokeless tobacco: Never  Substance Use Topics   Alcohol use: No    Alcohol/week: 0.0 standard drinks of alcohol   Marital Status: Married  ROS  Review of Systems  Cardiovascular:  Negative for chest pain, dyspnea on exertion and leg swelling.  Musculoskeletal:  Positive for back pain.   Objective  Blood pressure 126/87, pulse 83, height '5\' 5"'$  (1.651 m), weight 264 lb 12.8 oz (120.1 kg), SpO2 95 %. Body mass index is 44.07 kg/m.     12/21/2022    1:28 PM 06/18/2022    8:46 AM 05/17/2022    9:09 AM  Vitals with BMI  Height '5\' 5"'$  '5\' 5"'$  '5\' 5"'$   Weight 264 lbs 13 oz 258 lbs 13 oz 261 lbs  BMI 44.06 430.09423.30 Systolic [id number] Diastolic 87 86 84  Pulse 83 73 82    Physical Exam Constitutional:      Appearance: He is morbidly obese.  Neck:     Vascular: No JVD.  Cardiovascular:     Rate and Rhythm: Normal rate and regular rhythm.     Pulses: Intact distal pulses.     Heart  sounds: Normal heart sounds. No murmur heard.    No gallop.  Pulmonary:     Effort: Pulmonary effort is normal.     Breath sounds: Normal breath sounds.  Abdominal:     General: Bowel sounds are normal.     Palpations: Abdomen is soft.  Musculoskeletal:     Right lower leg: No edema.     Left lower leg: No edema.    Medications and allergies   Allergies  Allergen Reactions   Lisinopril Cough     Medication list after today's encounter   Current Outpatient Medications:    aspirin (ASPIRIN CHILDRENS) 81 MG chewable tablet, Chew 1 tablet (81 mg total) by mouth daily., Disp: , Rfl:    baclofen (LIORESAL) 10 MG tablet, Take 10 mg by mouth 3 (three) times daily., Disp: , Rfl:    glimepiride (AMARYL) 4 MG tablet, Take 4 mg by mouth daily., Disp: , Rfl: 3   glucose blood test strip, 1 each by Other route See admin instructions. Check blood sugar daily., Disp: , Rfl:    JARDIANCE 25 MG TABS tablet, Take 25 mg by mouth daily.,  Disp: , Rfl:    losartan (COZAAR) 50 MG tablet, Take 1 tablet (50 mg total) by mouth every evening., Disp: 90 tablet, Rfl: 3   metFORMIN (GLUCOPHAGE) 1000 MG tablet, Take 1,000 mg by mouth daily with breakfast., Disp: , Rfl:    Nitroglycerin (RECTIV) 0.4 % OINT, Place rectally., Disp: , Rfl:    pioglitazone (ACTOS) 30 MG tablet, Take 30 mg by mouth daily., Disp: , Rfl:    pregabalin (LYRICA) 75 MG capsule, Take 75 mg by mouth 2 (two) times daily., Disp: , Rfl:    rosuvastatin (CRESTOR) 20 MG tablet, Take 1 tablet (20 mg total) by mouth daily., Disp: 90 tablet, Rfl: 3   tadalafil (CIALIS) 20 MG tablet, Take 1 tablet (20 mg total) by mouth daily as needed for erectile dysfunction., Disp: 30 tablet, Rfl: 1   tamsulosin (FLOMAX) 0.4 MG CAPS capsule, Take 0.4 mg by mouth daily., Disp: , Rfl:    TRULICITY 1.5 GB/1.5VV SOPN, Inject 4.5 mg into the skin once a week., Disp: , Rfl:   Laboratory examination:   External labs:   Cholesterol, total 203.000 M 03/09/2022 HDL 30.000 MG 03/09/2022 LDL 112.000 M 03/09/2022 Triglycerides 353.000 M 03/09/2022  A1C 7.500 % 03/09/2022  Hemoglobin 17.700 G/ 12/06/2021 Platelets 199.000 X1 12/06/2021  Creatinine, Serum 1.150 MG/ 12/06/2021 ALT (SGPT) 22.000 IU/ 03/09/2022  Radiology:   Coronary calcium score 05/27/2022: LM 0 LAD 265 LCx 6.6 RCA 232 Total Agatston score 503. MESA database percentile: 88 Ascending aorta mildly dilated at 43 mm.  Descending aorta is normal at 29.  Scattered aortic calcification is evident.  Recommend annual screening.  Cardiac Studies:   PCV ECHOCARDIOGRAM COMPLETE 06/07/2022  Narrative Echocardiogram 06/07/2022: Normal LV systolic function with visual EF 60-65%. Left ventricle cavity is normal in size. Normal left ventricular wall thickness. Normal global wall motion. Normal diastolic filling pattern, normal LAP. No significant valvular heart disease. The aortic root is normal. Dilated proximal ascending aorta 18m. No  prior study for comparison.    PCV MYOCARDIAL PERFUSION WITH LEXISCAN 05/30/2022  Narrative Exercise Sestamibi stress test 05/30/2022: Exercise nuclear stress test was performed using Bruce protocol. 1 Day Rest and Stress images. Exercise time 7 minutes 2 seconds, achieved 8.62 METS, 89% APMHR. Stress ECG negative for ischemia. Normal myocardial perfusion with apical thinning artifact (normal variant), no obvious evidence of reversible  myocardial ischemia or prior infarct. Left ventricular size preserved, calculated LVEF 71%, no regional wall motion abnormalities. Prior study 09/06/2014 reported normal myocardial perfusion with apical thinning and small size insignificant inferoapical ischemia cannot be excluded. Low risk study.    EKG:   EKG 12/21/2022: Normal sinus rhythm at rate of 78 bpm, normal axis, incomplete right bundle branch block.  Low-voltage complexes.  No evidence of ischemia.  Compared to 05/17/2022, no significant change.  Assessment     ICD-10-CM   1. Elevated coronary artery calcium score 05/27/2022: Total Agatston score 503. MESA database percentile: 88  R93.1 EKG 12-Lead    aspirin (ASPIRIN CHILDRENS) 81 MG chewable tablet    2. Aneurysm of ascending aorta without rupture (HCC)  I71.21 EKG 12-Lead    PCV ECHOCARDIOGRAM COMPLETE    3. Pure hypercholesterolemia  E78.00     4. Primary hypertension  I10     5. Erectile dysfunction following radiation therapy  N52.35 tadalafil (CIALIS) 20 MG tablet       Medications Discontinued During This Encounter  Medication Reason   sildenafil (VIAGRA) 100 MG tablet Change in therapy      Meds ordered this encounter  Medications   tadalafil (CIALIS) 20 MG tablet    Sig: Take 1 tablet (20 mg total) by mouth daily as needed for erectile dysfunction.    Dispense:  30 tablet    Refill:  1   aspirin (ASPIRIN CHILDRENS) 81 MG chewable tablet    Sig: Chew 1 tablet (81 mg total) by mouth daily.   Orders Placed This  Encounter  Procedures   EKG 12-Lead   PCV ECHOCARDIOGRAM COMPLETE    Standing Status:   Future    Standing Expiration Date:   12/22/2023   Recommendations:   Lance Frazier is a 62 y.o. Caucasian male patient with diabetes mellitus with hyperglycemia, OSA on CPAP and compliant, hyperlipidemia, primary hypertension, chronic back pain, has spinal stimulator placed sometime in 2015, diagnosis of prostate cancer for which he underwent radiation therapy in 2023 and being followed at [facility], now follows Lance Frazier.  This is a 6-monthoffice visit.  1. Elevated coronary artery calcium score 05/27/2022: Total Agatston score 503. MESA database percentile: 879Patient with elevated coronary calcium score, presently without angina pectoris, although activity is limited, is presently on appropriate medical therapy.  I had not started him on aspirin as he was contemplating prostate surgery, will start aspirin 81 mg daily.  2. Aneur[place] of [place] aorta without rupture ([place] I reviewed the echocardiogram, 4.6 cm ascending aortic aneurysm noted.  Previously 4.5 cm, overall appears stable, however I would like to repeat echocardiogram in 6 more months and if stable, on annual basis.  Good correlation between CT scan and echocardiogram hence we can continue echocardiographic surveillance.  Discussed risks of dissection and symptoms of dissection.  3. Pure hypercholesterolemia Lipids have not been checked, has labs to be done scheduled on Monday, I will try to obtain the results of the labs from his PCP Dr. RAsencion Noble  4. Primary hypertension Blood pressure is well-controlled.  Presently on losartan, continue the same.  5. Erectile dysfunction following radiation therapy He tried Viagra with no significant benefit, could not sustain erection, will use long-acting tadalafil 20 mg daily as needed.  He has BPH issues, hopefully this will also help with frequency.  He is aware of the interaction with  nitroglycerin.  In view of morbid obesity, diabetes mellitus, patient is presently on Trulicity but would  recommend trying to switch to [facility] both for weight loss and for diabetes as it has dual effects.  Will get the opinion from Dr. Willey Blade.  Could consider discontinuing glimepiride and potentially discontinue Actos if Mounjaro is effective.  Since being on Jardiance, A1c has improved.  I discussed with the patient to slow down on eating, taking a 10 to 15-minute break halfway through the meal to improve satiety, trying not to eat high-calorie, fried foods, discussed.  Otherwise stable from cardiac standpoint, glad to see him back in 6 months for follow-up.     Adrian Prows, MD, [facility] 12/21/2022, 3:20 PM Office: [phone]

## 2022-12-27 DIAGNOSIS — M47812 Spondylosis without myelopathy or radiculopathy, cervical region: Secondary | ICD-10-CM | POA: Diagnosis not present

## 2022-12-27 DIAGNOSIS — R7309 Other abnormal glucose: Secondary | ICD-10-CM | POA: Diagnosis not present

## 2022-12-27 DIAGNOSIS — E1140 Type 2 diabetes mellitus with diabetic neuropathy, unspecified: Secondary | ICD-10-CM | POA: Diagnosis not present

## 2022-12-27 DIAGNOSIS — I10 Essential (primary) hypertension: Secondary | ICD-10-CM | POA: Diagnosis not present

## 2023-01-22 DIAGNOSIS — K6289 Other specified diseases of anus and rectum: Secondary | ICD-10-CM | POA: Diagnosis not present

## 2023-01-22 DIAGNOSIS — K648 Other hemorrhoids: Secondary | ICD-10-CM | POA: Diagnosis not present

## 2023-02-14 DIAGNOSIS — M722 Plantar fascial fibromatosis: Secondary | ICD-10-CM | POA: Diagnosis not present

## 2023-02-14 DIAGNOSIS — M79672 Pain in left foot: Secondary | ICD-10-CM | POA: Diagnosis not present

## 2023-02-14 DIAGNOSIS — L600 Ingrowing nail: Secondary | ICD-10-CM | POA: Diagnosis not present

## 2023-02-14 DIAGNOSIS — M79674 Pain in right toe(s): Secondary | ICD-10-CM | POA: Diagnosis not present

## 2023-02-24 ENCOUNTER — Other Ambulatory Visit: Payer: Self-pay | Admitting: Cardiology

## 2023-02-24 DIAGNOSIS — N5235 Erectile dysfunction following radiation therapy: Secondary | ICD-10-CM

## 2023-02-28 DIAGNOSIS — M4722 Other spondylosis with radiculopathy, cervical region: Secondary | ICD-10-CM | POA: Diagnosis not present

## 2023-02-28 DIAGNOSIS — M961 Postlaminectomy syndrome, not elsewhere classified: Secondary | ICD-10-CM | POA: Diagnosis not present

## 2023-02-28 DIAGNOSIS — M5416 Radiculopathy, lumbar region: Secondary | ICD-10-CM | POA: Diagnosis not present

## 2023-02-28 DIAGNOSIS — M4312 Spondylolisthesis, cervical region: Secondary | ICD-10-CM | POA: Diagnosis not present

## 2023-03-07 DIAGNOSIS — M722 Plantar fascial fibromatosis: Secondary | ICD-10-CM | POA: Diagnosis not present

## 2023-03-07 DIAGNOSIS — M79672 Pain in left foot: Secondary | ICD-10-CM | POA: Diagnosis not present

## 2023-03-07 DIAGNOSIS — M7662 Achilles tendinitis, left leg: Secondary | ICD-10-CM | POA: Diagnosis not present

## 2023-03-14 ENCOUNTER — Other Ambulatory Visit: Payer: Self-pay | Admitting: Cardiology

## 2023-03-14 DIAGNOSIS — E1165 Type 2 diabetes mellitus with hyperglycemia: Secondary | ICD-10-CM

## 2023-03-14 DIAGNOSIS — C61 Malignant neoplasm of prostate: Secondary | ICD-10-CM | POA: Diagnosis not present

## 2023-03-14 MED ORDER — MOUNJARO 7.5 MG/0.5ML ~~LOC~~ SOAJ: 7.5000 mg | SUBCUTANEOUS | 1 refills | Status: DC

## 2023-03-14 NOTE — Progress Notes (Signed)
ICD-10-CM   1. Type 2 diabetes mellitus with hyperglycemia, with long-term current use of insulin  E11.65 tirzepatide (MOUNJARO) 7.5 MG/0.5ML Pen   Z79.4     2. Severe obesity (BMI >= 40)  E66.01 tirzepatide (MOUNJARO) 7.5 MG/0.5ML Pen     No orders of the defined types were placed in this encounter.   Meds ordered this encounter  Medications   tirzepatide (MOUNJARO) 7.5 MG/0.5ML Pen    Sig: Inject 7.5 mg into the skin once a week.    Dispense:  6 mL    Refill:  1   Medications Discontinued During This Encounter  Medication Reason   TRULICITY 1.5 MG/0.5ML SOPN Change in therapy

## 2023-04-09 DIAGNOSIS — Z4889 Encounter for other specified surgical aftercare: Secondary | ICD-10-CM | POA: Diagnosis not present

## 2023-04-17 ENCOUNTER — Other Ambulatory Visit (HOSPITAL_COMMUNITY): Payer: Self-pay

## 2023-04-17 ENCOUNTER — Other Ambulatory Visit: Payer: Self-pay

## 2023-04-17 MED ORDER — WEGOVY 2.4 MG/0.75ML ~~LOC~~ SOAJ
2.4000 mg | SUBCUTANEOUS | 2 refills | Status: DC
  Filled 2023-04-17 – 2023-04-19 (×2): qty 3, 28d supply, fill #0

## 2023-04-19 ENCOUNTER — Other Ambulatory Visit (HOSPITAL_COMMUNITY): Payer: Self-pay

## 2023-04-19 ENCOUNTER — Other Ambulatory Visit: Payer: Self-pay

## 2023-04-19 DIAGNOSIS — R931 Abnormal findings on diagnostic imaging of heart and coronary circulation: Secondary | ICD-10-CM

## 2023-04-19 DIAGNOSIS — E1165 Type 2 diabetes mellitus with hyperglycemia: Secondary | ICD-10-CM

## 2023-04-19 MED ORDER — OZEMPIC (2 MG/DOSE) 8 MG/3ML ~~LOC~~ SOPN
2.0000 mg | PEN_INJECTOR | SUBCUTANEOUS | 3 refills | Status: DC
  Filled 2023-04-19: qty 9, fill #0

## 2023-04-19 MED ORDER — OZEMPIC (2 MG/DOSE) 8 MG/3ML ~~LOC~~ SOPN
2.0000 mg | PEN_INJECTOR | SUBCUTANEOUS | 3 refills | Status: DC
  Filled 2023-04-19: qty 9, 84d supply, fill #0

## 2023-04-23 ENCOUNTER — Telehealth: Payer: Self-pay

## 2023-04-23 NOTE — Telephone Encounter (Signed)
Patient called for medication refill but didn't leave the name of the medication. I called back and wife answered. She will have him return my call.

## 2023-04-24 DIAGNOSIS — G4733 Obstructive sleep apnea (adult) (pediatric): Secondary | ICD-10-CM | POA: Diagnosis not present

## 2023-05-10 ENCOUNTER — Other Ambulatory Visit: Payer: Self-pay | Admitting: Cardiology

## 2023-05-10 DIAGNOSIS — N5235 Erectile dysfunction following radiation therapy: Secondary | ICD-10-CM

## 2023-05-16 DIAGNOSIS — K648 Other hemorrhoids: Secondary | ICD-10-CM | POA: Diagnosis not present

## 2023-05-20 ENCOUNTER — Telehealth: Payer: Self-pay | Admitting: *Deleted

## 2023-05-20 NOTE — Telephone Encounter (Signed)
Call placed to patient. LMTRC.  

## 2023-05-20 NOTE — Telephone Encounter (Signed)
Patient returned call and made aware.  ? ?Verbalized understanding.  ?

## 2023-05-20 NOTE — Telephone Encounter (Signed)
Received call from patient [phone]~ telephone.   Patient inquired if provider utilizes infrared or thermal ablation for hemorrhoidectomy.   Please advise.

## 2023-05-21 ENCOUNTER — Other Ambulatory Visit: Payer: Self-pay

## 2023-05-21 ENCOUNTER — Ambulatory Visit (INDEPENDENT_AMBULATORY_CARE_PROVIDER_SITE_OTHER): Payer: BC Managed Care – PPO | Admitting: Gastroenterology

## 2023-05-21 ENCOUNTER — Other Ambulatory Visit: Payer: Self-pay | Admitting: *Deleted

## 2023-05-21 ENCOUNTER — Encounter: Payer: Self-pay | Admitting: Cardiology

## 2023-05-21 ENCOUNTER — Encounter: Payer: Self-pay | Admitting: *Deleted

## 2023-05-21 ENCOUNTER — Encounter: Payer: Self-pay | Admitting: Gastroenterology

## 2023-05-21 VITALS — BP 139/88 | HR 94 | Temp 98.1°F | Ht 65.0 in | Wt 263.2 lb

## 2023-05-21 DIAGNOSIS — Z1211 Encounter for screening for malignant neoplasm of colon: Secondary | ICD-10-CM | POA: Diagnosis not present

## 2023-05-21 DIAGNOSIS — K6289 Other specified diseases of anus and rectum: Secondary | ICD-10-CM | POA: Diagnosis not present

## 2023-05-21 DIAGNOSIS — K641 Second degree hemorrhoids: Secondary | ICD-10-CM | POA: Diagnosis not present

## 2023-05-21 MED ORDER — PEG 3350-KCL-NA BICARB-NACL 420 G PO SOLR: 4000.0000 mL | Freq: Once | ORAL | 0 refills | Status: AC

## 2023-05-21 NOTE — Patient Instructions (Addendum)
We are scheduling you for colonoscopy in the near future. We will contact you to get you scheduled once we see who has the soonest availability.   I am calling in a compounded hemorrhoid cream to [employer] for you. You will apply this 3 times a day for 10 to 14 days.  You may also continue to use sitz bath's and Tucks pads as needed.  Continue to avoid any straining or heavy lifting.  While using this [facility] cream with diltiazem please make sure you avoid taking any tadalafil  As we discussed you may apply some ice to the rectal area if needed.  I would also reduce the pressure of your bidet to avoid any external skin irritation.  We will follow-up after colonoscopy determine if you can do any hemorrhoid banding.  It was a pleasure to see you today. I want to create trusting relationships with patients. If you receive a survey regarding your visit,  I greatly appreciate you taking time to fill this out on paper or through your MyChart. I value your feedback.  Brooke Bonito, MSN, FNP-BC, AGACNP-BC [facility]

## 2023-05-21 NOTE — Progress Notes (Signed)
GI Office Note    Referring Provider: Carylon Perches, MD Primary Care Physician:  Carylon Perches, MD  Primary Gastroenterologist: Dr. Tasia Catchings  Chief Complaint   Chief Complaint  Patient presents with   Hemorrhoids    Hemorrhoids    History of Present Illness   Lance Frazier is a 62 y.o. male presenting today at the request of Carylon Perches, MD for hemorrhoids/rectal pain.   Last colonoscopy in 2011 - unable to determine findings on report  Had radiation for prostate cancer. Had a gel wall placed to try to avoid issues with radiation but he is in significant discomfort to the rectal area.  Went to urgent care a couple months ago and then again this past Thursday. He did a rectal exam and said he does have some hemorrhoids. Patient states he has had a fissure before in the past as well and this pain is completely different from fissure pain.  No issues with constipation, no bleeding. No itching. Just having pain. Left side hurts the most. He has had external hemorrhoids before about 5-6 years ago and had a hemorrhoidectomy. These current hemorrhoids but go back in on there own.   Denies any lack of appetite, chest pain, shortness of breath, unintentional weight loss.   Current Outpatient Medications  Medication Sig Dispense Refill   aspirin (ASPIRIN CHILDRENS) 81 MG chewable tablet Chew 1 tablet (81 mg total) by mouth daily.     baclofen (LIORESAL) 10 MG tablet Take 10 mg by mouth 3 (three) times daily.     glimepiride (AMARYL) 4 MG tablet Take 4 mg by mouth daily.  3   glucose blood test strip 1 each by Other route See admin instructions. Check blood sugar daily.     JARDIANCE 25 MG TABS tablet Take 25 mg by mouth daily.     losartan (COZAAR) 50 MG tablet Take 1 tablet (50 mg total) by mouth every evening. 90 tablet 3   metFORMIN (GLUCOPHAGE) 1000 MG tablet Take 1,000 mg by mouth daily with breakfast.     nabumetone (RELAFEN) 750 MG tablet Take 750 mg by mouth 2 (two) times daily.      Nitroglycerin (RECTIV) 0.4 % OINT Place rectally.     OZEMPIC, 2 MG/DOSE, 8 MG/3ML SOPN Inject 2 mg into the skin once a week. 9 mL 3   pioglitazone (ACTOS) 30 MG tablet Take 30 mg by mouth daily.     pregabalin (LYRICA) 75 MG capsule Take 75 mg by mouth 2 (two) times daily.     rosuvastatin (CRESTOR) 20 MG tablet Take 1 tablet (20 mg total) by mouth daily. 90 tablet 3   tadalafil (CIALIS) 20 MG tablet TAKE 1 TABLET(20 MG) BY MOUTH DAILY AS NEEDED FOR ERECTILE DYSFUNCTION 30 tablet 1   tamsulosin (FLOMAX) 0.4 MG CAPS capsule Take 0.4 mg by mouth daily.     No current facility-administered medications for this visit.    Past Medical History:  Diagnosis Date   Anxiety    Arthritis    Fibromyalgia    Hyperlipidemia 05/28/2012   Hypertension    Marijuana use    Obesity    BMI of 40.4 at a weight of 265 pounds   Prostate cancer (HCC) 06/08/2022   Prostate cancer [facility])     Past Surgical History:  Procedure Laterality Date   COLONOSCOPY  11/2009   Dr. Jena Gauss: single external hemorrhoid tag, anal papilla, normal TI   HEMORRHOID SURGERY  02/06/2012   Procedure: HEMORRHOIDECTOMY;  Surgeon: Dalia Heading, MD;  Location: [facility];  Service: General;  Laterality: N/A;   HEMORRHOID SURGERY N/A 08/13/2014   Procedure: EXTENSIVE HEMORRHOIDECTOMY;  Surgeon: Dalia Heading, MD;  Location: [facility];  Service: General;  Laterality: N/A;   HERNIA REPAIR     umbilical-[facility]   KNEE ARTHROSCOPY     right x2   LUMBAR FUSION  Sep 08, 2014   TONSILLECTOMY     UVULECTOMY      Family History  Problem Relation Age of Onset   Heart disease Mother    Coronary artery disease Father 13       Acute MI   Heart disease Maternal Aunt    Anesthesia problems Neg Hx    Hypotension Neg Hx    Malignant hyperthermia Neg Hx    Pseudochol deficiency Neg Hx    Colon cancer Neg Hx     Allergies as of 05/21/2023 - Review Complete 05/21/2023  Allergen Reaction Noted   Lisinopril Cough 06/19/2022    Social History    Socioeconomic History   Marital status: Married    Spouse name: Not on file   Number of children: 3   Years of education: Not on file   Highest education level: Not on file  Occupational History   Not on file  Tobacco Use   Smoking status: Never   Smokeless tobacco: Never  Vaping Use   Vaping Use: Never used  Substance and Sexual Activity   Alcohol use: No    Alcohol/week: 0.0 standard drinks of alcohol   Drug use: Yes    Types: Marijuana   Sexual activity: Yes    Birth control/protection: None  Other Topics Concern   Not on file  Social History Narrative   Not on file   Social Determinants of Health   Financial Resource Strain: Not on file  Food Insecurity: Not on file  Transportation Needs: Not on file  Physical Activity: Not on file  Stress: Not on file  Social Connections: Not on file  Intimate Partner Violence: Not on file     Review of Systems   Gen: Denies any fever, chills, fatigue, weight loss, lack of appetite.  CV: Denies chest pain, heart palpitations, peripheral edema, syncope.  Resp: Denies shortness of breath at rest or with exertion. Denies wheezing or cough.  GI: see HPI GU : Denies urinary burning, urinary frequency, urinary hesitancy MS: Denies joint pain, muscle weakness, cramps, or limitation of movement.  Derm: Denies rash, itching, dry skin Psych: Denies depression, anxiety, memory loss, and confusion Heme: Denies bruising, bleeding, and enlarged lymph nodes.   Physical Exam   BP 139/88 (BP Location: Right Arm, Patient Position: Sitting, Cuff Size: Large)   Pulse 94   Temp 98.1 F (36.7 C) (Temporal)   Ht 5\' 5"  (1.651 m)   Wt 263 lb 3.2 oz (119.4 kg)   SpO2 94%   BMI 43.80 kg/m   General:   Alert and oriented. Pleasant and cooperative. Well-nourished and well-developed.  Head:  Normocephalic and atraumatic. Eyes:  Without icterus, sclera clear and conjunctiva pink.  Ears:  Normal auditory acuity. Mouth:  No deformity or  lesions, oral mucosa pink.  Lungs:  Clear to auscultation bilaterally. No wheezes, rales, or rhonchi. No distress.  Heart:  S1, S2 present without murmurs appreciated.  Abdomen:  +BS, soft, non-tender and non-distended. No HSM noted. No guarding or rebound. No masses appreciated.  Rectal:  Deferred  Msk:  Symmetrical without gross deformities. Normal  posture. Extremities:  Without edema. Neurologic:  Alert and  oriented x4;  grossly normal neurologically. Skin:  Intact without significant lesions or rashes. Psych:  Alert and cooperative. Normal mood and affect.   Assessment   Lance Frazier is a 62 y.o. male with a history of diabetes, prostate cancer s/p radiation, HTN, HLD, fibromyalgia, anxiety, and arthritis presenting today for evaluation of rectal pain likely secondary to hemorrhoids.  Hemorrhoids, rectal pain: Denies any bleeding or itching but is having significant pain/discomfort especially to the left side of his rectum.  Reports he has been to urgent care regards to his rectal pain and has had a rectal exam which revealed hemorrhoids and they encouraged him to see GI in regards to treatment.  He was to be considered for hemorrhoid banding however given his recent prostate cancer treated with radiation, need to evaluate for radiation proctitis and to assess the colon and rectum endoscopically prior to considering banding to avoid any significant complications in regards to the rectal mucosa.  We will schedule him for colonoscopy given he is also due for screening purposes as his last colonoscopy was in 2011.  We will treat with compounded hemorrhoid cream with lidocaine and diltiazem for pain relief.  Side effects were discussed.  Screening for colon cancer: Last colonoscopy in 2011.  Overdue for screening.  Will proceed with procedure.  No alarm symptoms present other than the above hemorrhoids and mild rectal pain/discomfort.   PLAN   Compounded hemorrhoid cream with lidocaine and  diltiazem 2% - apply 3 times daily for 10 to 14 days (avoid tadalafil while on this medication) Continue sitz bath's and Tucks pads as needed May apply ice to the rectal area with a barrier to help with pain relief if needed Avoid straining Proceed with colonoscopy with propofol by Dr. Tasia Catchings in near future: the risks, benefits, and alternatives have been discussed with the patient in detail. The patient states understanding and desires to proceed. ASA 3 (BMI) Hold Jardiance for 3 days Hold Ozempic for 1 week Hold Metformin, Actos, and Glimepiride the night prior to the morning of your procedure Avoid any NSAIDs including nabumetone Follow-up after colonoscopy and we will determine if you are able to proceed with hemorrhoid banding.    Brooke Bonito, MSN, FNP-BC, AGACNP-BC [facility]  I have reviewed the note and agree with the APP's assessment as described in this note  Vista Lawman, MD [facility]astro[facility]ology and [facility]atology [facility]

## 2023-05-21 NOTE — H&P (View-Only) (Signed)
GI Office Note    Referring Provider: Carylon Perches, MD Primary Care Physician:  Carylon Perches, MD  Primary Gastroenterologist: Dr. Tasia Catchings  Chief Complaint   Chief Complaint  Patient presents with   Hemorrhoids    Hemorrhoids    History of Present Illness   Lance Frazier is a 62 y.o. male presenting today at the request of Carylon Perches, MD for hemorrhoids/rectal pain.   Last colonoscopy in 2011 - unable to determine findings on report  Had radiation for prostate cancer. Had a gel wall placed to try to avoid issues with radiation but he is in significant discomfort to the rectal area.  Went to urgent care a couple months ago and then again this past Thursday. He did a rectal exam and said he does have some hemorrhoids. Patient states he has had a fissure before in the past as well and this pain is completely different from fissure pain.  No issues with constipation, no bleeding. No itching. Just having pain. Left side hurts the most. He has had external hemorrhoids before about 5-6 years ago and had a hemorrhoidectomy. These current hemorrhoids but go back in on there own.   Denies any lack of appetite, chest pain, shortness of breath, unintentional weight loss.   Current Outpatient Medications  Medication Sig Dispense Refill   aspirin (ASPIRIN CHILDRENS) 81 MG chewable tablet Chew 1 tablet (81 mg total) by mouth daily.     baclofen (LIORESAL) 10 MG tablet Take 10 mg by mouth 3 (three) times daily.     glimepiride (AMARYL) 4 MG tablet Take 4 mg by mouth daily.  3   glucose blood test strip 1 each by Other route See admin instructions. Check blood sugar daily.     JARDIANCE 25 MG TABS tablet Take 25 mg by mouth daily.     losartan (COZAAR) 50 MG tablet Take 1 tablet (50 mg total) by mouth every evening. 90 tablet 3   metFORMIN (GLUCOPHAGE) 1000 MG tablet Take 1,000 mg by mouth daily with breakfast.     nabumetone (RELAFEN) 750 MG tablet Take 750 mg by mouth 2 (two) times daily.      Nitroglycerin (RECTIV) 0.4 % OINT Place rectally.     OZEMPIC, 2 MG/DOSE, 8 MG/3ML SOPN Inject 2 mg into the skin once a week. 9 mL 3   pioglitazone (ACTOS) 30 MG tablet Take 30 mg by mouth daily.     pregabalin (LYRICA) 75 MG capsule Take 75 mg by mouth 2 (two) times daily.     rosuvastatin (CRESTOR) 20 MG tablet Take 1 tablet (20 mg total) by mouth daily. 90 tablet 3   tadalafil (CIALIS) 20 MG tablet TAKE 1 TABLET(20 MG) BY MOUTH DAILY AS NEEDED FOR ERECTILE DYSFUNCTION 30 tablet 1   tamsulosin (FLOMAX) 0.4 MG CAPS capsule Take 0.4 mg by mouth daily.     No current facility-administered medications for this visit.    Past Medical History:  Diagnosis Date   Anxiety    Arthritis    Fibromyalgia    Hyperlipidemia 05/28/2012   Hypertension    Marijuana use    Obesity    BMI of 40.4 at a weight of 265 pounds   Prostate cancer (HCC) 06/08/2022   Prostate cancer [facility])     Past Surgical History:  Procedure Laterality Date   COLONOSCOPY  11/2009   Dr. Jena Gauss: single external hemorrhoid tag, anal papilla, normal TI   HEMORRHOID SURGERY  02/06/2012   Procedure: HEMORRHOIDECTOMY;  Surgeon: Dalia Heading, MD;  Location: [facility];  Service: General;  Laterality: N/A;   HEMORRHOID SURGERY N/A 08/13/2014   Procedure: EXTENSIVE HEMORRHOIDECTOMY;  Surgeon: Dalia Heading, MD;  Location: [facility];  Service: General;  Laterality: N/A;   HERNIA REPAIR     umbilical-[facility]   KNEE ARTHROSCOPY     right x2   LUMBAR FUSION  Sep 08, 2014   TONSILLECTOMY     UVULECTOMY      Family History  Problem Relation Age of Onset   Heart disease Mother    Coronary artery disease Father 13       Acute MI   Heart disease Maternal Aunt    Anesthesia problems Neg Hx    Hypotension Neg Hx    Malignant hyperthermia Neg Hx    Pseudochol deficiency Neg Hx    Colon cancer Neg Hx     Allergies as of 05/21/2023 - Review Complete 05/21/2023  Allergen Reaction Noted   Lisinopril Cough 06/19/2022    Social History    Socioeconomic History   Marital status: Married    Spouse name: Not on file   Number of children: 3   Years of education: Not on file   Highest education level: Not on file  Occupational History   Not on file  Tobacco Use   Smoking status: Never   Smokeless tobacco: Never  Vaping Use   Vaping Use: Never used  Substance and Sexual Activity   Alcohol use: No    Alcohol/week: 0.0 standard drinks of alcohol   Drug use: Yes    Types: Marijuana   Sexual activity: Yes    Birth control/protection: None  Other Topics Concern   Not on file  Social History Narrative   Not on file   Social Determinants of Health   Financial Resource Strain: Not on file  Food Insecurity: Not on file  Transportation Needs: Not on file  Physical Activity: Not on file  Stress: Not on file  Social Connections: Not on file  Intimate Partner Violence: Not on file     Review of Systems   Gen: Denies any fever, chills, fatigue, weight loss, lack of appetite.  CV: Denies chest pain, heart palpitations, peripheral edema, syncope.  Resp: Denies shortness of breath at rest or with exertion. Denies wheezing or cough.  GI: see HPI GU : Denies urinary burning, urinary frequency, urinary hesitancy MS: Denies joint pain, muscle weakness, cramps, or limitation of movement.  Derm: Denies rash, itching, dry skin Psych: Denies depression, anxiety, memory loss, and confusion Heme: Denies bruising, bleeding, and enlarged lymph nodes.   Physical Exam   BP 139/88 (BP Location: Right Arm, Patient Position: Sitting, Cuff Size: Large)   Pulse 94   Temp 98.1 F (36.7 C) (Temporal)   Ht 5\' 5"  (1.651 m)   Wt 263 lb 3.2 oz (119.4 kg)   SpO2 94%   BMI 43.80 kg/m   General:   Alert and oriented. Pleasant and cooperative. Well-nourished and well-developed.  Head:  Normocephalic and atraumatic. Eyes:  Without icterus, sclera clear and conjunctiva pink.  Ears:  Normal auditory acuity. Mouth:  No deformity or  lesions, oral mucosa pink.  Lungs:  Clear to auscultation bilaterally. No wheezes, rales, or rhonchi. No distress.  Heart:  S1, S2 present without murmurs appreciated.  Abdomen:  +BS, soft, non-tender and non-distended. No HSM noted. No guarding or rebound. No masses appreciated.  Rectal:  Deferred  Msk:  Symmetrical without gross deformities. Normal  posture. Extremities:  Without edema. Neurologic:  Alert and  oriented x4;  grossly normal neurologically. Skin:  Intact without significant lesions or rashes. Psych:  Alert and cooperative. Normal mood and affect.   Assessment   Lance Frazier is a 62 y.o. male with a history of diabetes, prostate cancer s/p radiation, HTN, HLD, fibromyalgia, anxiety, and arthritis presenting today for evaluation of rectal pain likely secondary to hemorrhoids.  Hemorrhoids, rectal pain: Denies any bleeding or itching but is having significant pain/discomfort especially to the left side of his rectum.  Reports he has been to urgent care regards to his rectal pain and has had a rectal exam which revealed hemorrhoids and they encouraged him to see GI in regards to treatment.  He was to be considered for hemorrhoid banding however given his recent prostate cancer treated with radiation, need to evaluate for radiation proctitis and to assess the colon and rectum endoscopically prior to considering banding to avoid any significant complications in regards to the rectal mucosa.  We will schedule him for colonoscopy given he is also due for screening purposes as his last colonoscopy was in 2011.  We will treat with compounded hemorrhoid cream with lidocaine and diltiazem for pain relief.  Side effects were discussed.  Screening for colon cancer: Last colonoscopy in 2011.  Overdue for screening.  Will proceed with procedure.  No alarm symptoms present other than the above hemorrhoids and mild rectal pain/discomfort.   PLAN   Compounded hemorrhoid cream with lidocaine and  diltiazem 2% - apply 3 times daily for 10 to 14 days (avoid tadalafil while on this medication) Continue sitz bath's and Tucks pads as needed May apply ice to the rectal area with a barrier to help with pain relief if needed Avoid straining Proceed with colonoscopy with propofol by Dr. Tasia Catchings in near future: the risks, benefits, and alternatives have been discussed with the patient in detail. The patient states understanding and desires to proceed. ASA 3 (BMI) Hold Jardiance for 3 days Hold Ozempic for 1 week Hold Metformin, Actos, and Glimepiride the night prior to the morning of your procedure Avoid any NSAIDs including nabumetone Follow-up after colonoscopy and we will determine if you are able to proceed with hemorrhoid banding.    Brooke Bonito, MSN, FNP-BC, AGACNP-BC [facility]  I have reviewed the note and agree with the APP's assessment as described in this note  Vista Lawman, MD Gastroenterology and Hepatology [facility]

## 2023-05-22 ENCOUNTER — Encounter: Payer: Self-pay | Admitting: *Deleted

## 2023-05-22 ENCOUNTER — Encounter: Payer: Self-pay | Admitting: Gastroenterology

## 2023-05-24 NOTE — Telephone Encounter (Signed)
Error

## 2023-06-03 ENCOUNTER — Encounter: Payer: Self-pay | Admitting: *Deleted

## 2023-06-03 ENCOUNTER — Telehealth: Payer: Self-pay | Admitting: *Deleted

## 2023-06-03 NOTE — Telephone Encounter (Signed)
Pt has been rescheduled from 06/06/23 until 06/14/23 at 11:00 am. Updated instructions sent to pt via MyChart.

## 2023-06-03 NOTE — Telephone Encounter (Signed)
Called pt, LMOVM to call back to move procedure for Thursday 7/18 due to CRNA  TCS with propofol asa 3

## 2023-06-04 ENCOUNTER — Encounter (HOSPITAL_COMMUNITY): Payer: BC Managed Care – PPO

## 2023-06-11 NOTE — Patient Instructions (Addendum)
Lance Frazier  06/11/2023     @PREFPERIOPPHARMACY @   Your procedure is scheduled on  06/14/2023.   Report to [facility] at  0915  A.M.   Call this number if you have problems the morning of surgery:  [phone]  If you experience any cold or flu symptoms such as cough, fever, chills, shortness of breath, etc. between now and your scheduled surgery, please notify us at the above number.        Your last dose of Ozempic should have been on 7/18 and your last dose of Jardiance sholuh have been 7/22.    Remember:  Follow the diet and prep instructions given to you by the office.     Take these medicines the morning of surgery with A SIP OF WATER                                                 baclofen (if needed).       Do not wear jewelry, make-up or nail polish, including gel polish,  artificial nails, or any other type of covering on natural nails (fingers and  toes).  Do not wear lotions, powders, or perfumes, or deodorant.  Do not shave 48 hours prior to surgery.  Men may shave face and neck.  Do not bring valuables to the hospital.  [facility] is not responsible for any belongings or valuables.  Contacts, dentures or bridgework may not be worn into surgery.  Leave your suitcase in the car.  After surgery it may be brought to your room.  For patients admitted to the hospital, discharge time will be determined by your treatment team.  Patients discharged the day of surgery will not be allowed to drive home and must have someone with them for 24 hours.    Special instructions:   DO NOT smoke tobacco or vape for 24 hours before your procedure.  Please read over the following fact sheets that you were given. Anesthesia Post-op Instructions and Care and Recovery After Surgery      Colonoscopy, Adult, Care After The following information offers guidance on how to care for yourself after your procedure. Your health care provider may also give you more  specific instructions. If you have problems or questions, contact your health care provider. What can I expect after the procedure? After the procedure, it is common to have: A small amount of blood in your stool for 24 hours after the procedure. Some gas. Mild cramping or bloating of your abdomen. Follow these instructions at home: Eating and drinking  Drink enough fluid to keep your urine pale yellow. Follow instructions from your health care provider about eating or drinking restrictions. Resume your normal diet as told by your health care provider. Avoid heavy or fried foods that are hard to digest. Activity Rest as told by your health care provider. Avoid sitting for a long time without moving. Get up to take short walks every 1-2 hours. This is important to improve blood flow and breathing. Ask for help if you feel weak or unsteady. Return to your normal activities as told by your health care provider. Ask your health care provider what activities are safe for you. Managing cramping and bloating  Try walking around when you have cramps or feel bloated. If directed, apply heat to  your abdomen as told by your health care provider. Use the heat source that your health care provider recommends, such as a moist heat pack or a heating pad. Place a towel between your skin and the heat source. Leave the heat on for 20-30 minutes. Remove the heat if your skin turns bright red. This is especially important if you are unable to feel pain, heat, or cold. You have a greater risk of getting burned. General instructions If you were given a sedative during the procedure, it can affect you for several hours. Do not drive or operate machinery until your health care provider says that it is safe. For the first 24 hours after the procedure: Do not sign important documents. Do not drink alcohol. Do your regular daily activities at a slower pace than normal. Eat soft foods that are easy to digest. Take  over-the-counter and prescription medicines only as told by your health care provider. Keep all follow-up visits. This is important. Contact a health care provider if: You have blood in your stool 2-3 days after the procedure. Get help right away if: You have more than a small spotting of blood in your stool. You have large blood clots in your stool. You have swelling of your abdomen. You have nausea or vomiting. You have a fever. You have increasing pain in your abdomen that is not relieved with medicine. These symptoms may be an emergency. Get help right away. Call 911. Do not wait to see if the symptoms will go away. Do not drive yourself to the hospital. Summary After the procedure, it is common to have a small amount of blood in your stool. You may also have mild cramping and bloating of your abdomen. If you were given a sedative during the procedure, it can affect you for several hours. Do not drive or operate machinery until your health care provider says that it is safe. Get help right away if you have a lot of blood in your stool, nausea or vomiting, a fever, or increased pain in your abdomen. This information is not intended to replace advice given to you by your health care provider. Make sure you discuss any questions you have with your health care provider. Document Revised: 12/18/2022 Document Reviewed: 06/28/2021 Elsevier Patient Education  2024 [employer] Monitored Anesthesia Care, Care After The following information offers guidance on how to care for yourself after your procedure. Your health care provider may also give you more specific instructions. If you have problems or questions, contact your health care provider. What can I expect after the procedure? After the procedure, it is common to have: Tiredness. Little or no memory about what happened during or after the procedure. Impaired judgment when it comes to making decisions. Nausea or vomiting. Some trouble  with balance. Follow these instructions at home: For the time period you were told by your health care provider:  Rest. Do not participate in activities where you could fall or become injured. Do not drive or use machinery. Do not drink alcohol. Do not take sleeping pills or medicines that cause drowsiness. Do not make important decisions or sign legal documents. Do not take care of children on your own. Medicines Take over-the-counter and prescription medicines only as told by your health care provider. If you were prescribed antibiotics, take them as told by your health care provider. Do not stop using the antibiotic even if you start to feel better. Eating and drinking Follow instructions from your health care provider  about what you may eat and drink. Drink enough fluid to keep your urine pale yellow. If you vomit: Drink clear fluids slowly and in small amounts as you are able. Clear fluids include water, ice chips, low-calorie sports drinks, and fruit juice that has water added to it (diluted fruit juice). Eat light and bland foods in small amounts as you are able. These foods include bananas, applesauce, rice, lean meats, toast, and crackers. General instructions  Have a responsible adult stay with you for the time you are told. It is important to have someone help care for you until you are awake and alert. If you have sleep apnea, surgery and some medicines can increase your risk for breathing problems. Follow instructions from your health care provider about wearing your sleep device: When you are sleeping. This includes during daytime naps. While taking prescription pain medicines, sleeping medicines, or medicines that make you drowsy. Do not use any products that contain nicotine or tobacco. These products include cigarettes, chewing tobacco, and vaping devices, such as e-cigarettes. If you need help quitting, ask your health care provider. Contact a health care provider  if: You feel nauseous or vomit every time you eat or drink. You feel light-headed. You are still sleepy or having trouble with balance after 24 hours. You get a rash. You have a fever. You have redness or swelling around the IV site. Get help right away if: You have trouble breathing. You have new confusion after you get home. These symptoms may be an emergency. Get help right away. Call 911. Do not wait to see if the symptoms will go away. Do not drive yourself to the hospital. This information is not intended to replace advice given to you by your health care provider. Make sure you discuss any questions you have with your health care provider. Document Revised: 04/02/2022 Document Reviewed: 04/02/2022 Elsevier Patient Education  2024 [employer].

## 2023-06-12 ENCOUNTER — Encounter (HOSPITAL_COMMUNITY): Payer: Self-pay

## 2023-06-12 ENCOUNTER — Other Ambulatory Visit: Payer: Self-pay

## 2023-06-12 ENCOUNTER — Encounter (HOSPITAL_COMMUNITY)
Admission: RE | Admit: 2023-06-12 | Discharge: 2023-06-12 | Disposition: A | Discharge status: Home / Self Care | Payer: BC Managed Care – PPO | Source: Ambulatory Visit | Attending: Gastroenterology | Admitting: Gastroenterology

## 2023-06-12 VITALS — BP 129/68 | HR 80 | Temp 97.8°F | Resp 18 | Ht 65.0 in | Wt 263.2 lb

## 2023-06-12 DIAGNOSIS — I10 Essential (primary) hypertension: Secondary | ICD-10-CM | POA: Diagnosis not present

## 2023-06-12 DIAGNOSIS — Z1211 Encounter for screening for malignant neoplasm of colon: Secondary | ICD-10-CM | POA: Diagnosis not present

## 2023-06-12 DIAGNOSIS — Z6841 Body Mass Index (BMI) 40.0 and over, adult: Secondary | ICD-10-CM | POA: Diagnosis not present

## 2023-06-12 DIAGNOSIS — K5730 Diverticulosis of large intestine without perforation or abscess without bleeding: Secondary | ICD-10-CM | POA: Diagnosis not present

## 2023-06-12 DIAGNOSIS — K644 Residual hemorrhoidal skin tags: Secondary | ICD-10-CM | POA: Diagnosis not present

## 2023-06-12 DIAGNOSIS — K648 Other hemorrhoids: Secondary | ICD-10-CM | POA: Diagnosis not present

## 2023-06-12 DIAGNOSIS — G4730 Sleep apnea, unspecified: Secondary | ICD-10-CM | POA: Diagnosis not present

## 2023-06-12 DIAGNOSIS — Z01812 Encounter for preprocedural laboratory examination: Secondary | ICD-10-CM | POA: Insufficient documentation

## 2023-06-12 DIAGNOSIS — E119 Type 2 diabetes mellitus without complications: Secondary | ICD-10-CM | POA: Insufficient documentation

## 2023-06-12 DIAGNOSIS — Z7985 Long-term (current) use of injectable non-insulin antidiabetic drugs: Secondary | ICD-10-CM | POA: Diagnosis not present

## 2023-06-12 DIAGNOSIS — M1990 Unspecified osteoarthritis, unspecified site: Secondary | ICD-10-CM | POA: Diagnosis not present

## 2023-06-12 DIAGNOSIS — E785 Hyperlipidemia, unspecified: Secondary | ICD-10-CM | POA: Diagnosis not present

## 2023-06-12 DIAGNOSIS — M797 Fibromyalgia: Secondary | ICD-10-CM | POA: Diagnosis not present

## 2023-06-12 DIAGNOSIS — K5520 Angiodysplasia of colon without hemorrhage: Secondary | ICD-10-CM | POA: Diagnosis not present

## 2023-06-12 DIAGNOSIS — Z923 Personal history of irradiation: Secondary | ICD-10-CM | POA: Diagnosis not present

## 2023-06-12 DIAGNOSIS — Z8546 Personal history of malignant neoplasm of prostate: Secondary | ICD-10-CM | POA: Diagnosis not present

## 2023-06-12 DIAGNOSIS — G709 Myoneural disorder, unspecified: Secondary | ICD-10-CM | POA: Diagnosis not present

## 2023-06-12 DIAGNOSIS — Z7984 Long term (current) use of oral hypoglycemic drugs: Secondary | ICD-10-CM | POA: Diagnosis not present

## 2023-06-12 DIAGNOSIS — D125 Benign neoplasm of sigmoid colon: Secondary | ICD-10-CM | POA: Diagnosis not present

## 2023-06-12 HISTORY — DX: Type 2 diabetes mellitus without complications: E11.9

## 2023-06-12 LAB — BASIC METABOLIC PANEL
Anion gap: 8 (ref 5–15)
BUN: 17 mg/dL (ref 8–23)
CO2: 24 mmol/L (ref 22–32)
Calcium: 8.6 mg/dL — ABNORMAL LOW (ref 8.9–10.3)
Chloride: 103 mmol/L (ref 98–111)
Creatinine, Ser: 1.02 mg/dL (ref 0.61–1.24)
GFR, Estimated: >60 mL/min (ref >60)
Glucose, Bld: 195 mg/dL — ABNORMAL HIGH (ref 70–99)
Potassium: 4.2 mmol/L (ref 3.5–5.1)
Sodium: 135 mmol/L (ref 135–145)

## 2023-06-14 ENCOUNTER — Encounter (HOSPITAL_COMMUNITY)
Admission: RE | Disposition: A | Discharge status: Home / Self Care | Payer: Self-pay | Source: Home / Self Care | Attending: Gastroenterology

## 2023-06-14 ENCOUNTER — Encounter (HOSPITAL_COMMUNITY): Payer: Self-pay

## 2023-06-14 ENCOUNTER — Ambulatory Visit (HOSPITAL_COMMUNITY): Payer: BC Managed Care – PPO | Admitting: Anesthesiology

## 2023-06-14 ENCOUNTER — Ambulatory Visit (HOSPITAL_COMMUNITY)
Admission: RE | Admit: 2023-06-14 | Discharge: 2023-06-14 | Disposition: A | Discharge status: Home / Self Care | Payer: BC Managed Care – PPO | Attending: Gastroenterology | Admitting: Gastroenterology

## 2023-06-14 DIAGNOSIS — E119 Type 2 diabetes mellitus without complications: Secondary | ICD-10-CM | POA: Insufficient documentation

## 2023-06-14 DIAGNOSIS — E785 Hyperlipidemia, unspecified: Secondary | ICD-10-CM | POA: Insufficient documentation

## 2023-06-14 DIAGNOSIS — I10 Essential (primary) hypertension: Secondary | ICD-10-CM | POA: Diagnosis not present

## 2023-06-14 DIAGNOSIS — M797 Fibromyalgia: Secondary | ICD-10-CM | POA: Insufficient documentation

## 2023-06-14 DIAGNOSIS — K635 Polyp of colon: Secondary | ICD-10-CM | POA: Diagnosis not present

## 2023-06-14 DIAGNOSIS — K648 Other hemorrhoids: Secondary | ICD-10-CM | POA: Insufficient documentation

## 2023-06-14 DIAGNOSIS — K5730 Diverticulosis of large intestine without perforation or abscess without bleeding: Secondary | ICD-10-CM | POA: Diagnosis not present

## 2023-06-14 DIAGNOSIS — Z1211 Encounter for screening for malignant neoplasm of colon: Secondary | ICD-10-CM | POA: Insufficient documentation

## 2023-06-14 DIAGNOSIS — G4730 Sleep apnea, unspecified: Secondary | ICD-10-CM | POA: Insufficient documentation

## 2023-06-14 DIAGNOSIS — Z7985 Long-term (current) use of injectable non-insulin antidiabetic drugs: Secondary | ICD-10-CM | POA: Insufficient documentation

## 2023-06-14 DIAGNOSIS — D125 Benign neoplasm of sigmoid colon: Secondary | ICD-10-CM | POA: Insufficient documentation

## 2023-06-14 DIAGNOSIS — G709 Myoneural disorder, unspecified: Secondary | ICD-10-CM | POA: Diagnosis not present

## 2023-06-14 DIAGNOSIS — Z6841 Body Mass Index (BMI) 40.0 and over, adult: Secondary | ICD-10-CM | POA: Insufficient documentation

## 2023-06-14 DIAGNOSIS — K644 Residual hemorrhoidal skin tags: Secondary | ICD-10-CM | POA: Diagnosis not present

## 2023-06-14 DIAGNOSIS — Z7984 Long term (current) use of oral hypoglycemic drugs: Secondary | ICD-10-CM | POA: Insufficient documentation

## 2023-06-14 DIAGNOSIS — K5520 Angiodysplasia of colon without hemorrhage: Secondary | ICD-10-CM | POA: Insufficient documentation

## 2023-06-14 DIAGNOSIS — Z923 Personal history of irradiation: Secondary | ICD-10-CM | POA: Insufficient documentation

## 2023-06-14 DIAGNOSIS — M1990 Unspecified osteoarthritis, unspecified site: Secondary | ICD-10-CM | POA: Insufficient documentation

## 2023-06-14 DIAGNOSIS — Z8546 Personal history of malignant neoplasm of prostate: Secondary | ICD-10-CM | POA: Insufficient documentation

## 2023-06-14 HISTORY — PX: COLONOSCOPY WITH PROPOFOL: SHX5780

## 2023-06-14 LAB — HM COLONOSCOPY

## 2023-06-14 LAB — GLUCOSE, CAPILLARY: Glucose-Capillary: 141 mg/dL — ABNORMAL HIGH (ref 70–99)

## 2023-06-14 SURGERY — COLONOSCOPY WITH PROPOFOL
Anesthesia: General

## 2023-06-14 MED ORDER — PROPOFOL 10 MG/ML IV BOLUS
INTRAVENOUS | Status: DC | PRN
  Administered 2023-06-14: 150 mg via INTRAVENOUS
  Administered 2023-06-14: 40 mg via INTRAVENOUS

## 2023-06-14 MED ORDER — POLYETHYLENE GLYCOL 3350 17 GM/SCOOP PO POWD: 8.5000 g | Freq: Every day | ORAL | Status: AC

## 2023-06-14 MED ORDER — PROPOFOL 500 MG/50ML IV EMUL
INTRAVENOUS | Status: DC | PRN
  Administered 2023-06-14: 125 ug/kg/min via INTRAVENOUS

## 2023-06-14 MED ORDER — LACTATED RINGERS IV SOLN
INTRAVENOUS | Status: DC
  Administered 2023-06-14: 1000 mL via INTRAVENOUS

## 2023-06-14 NOTE — Anesthesia Procedure Notes (Signed)
Date/Time: 06/14/2023 11:34 AM  Performed by: Franco Nones, CRNAPre-anesthesia Checklist: Patient identified, Emergency Drugs available, Suction available, Timeout performed and Patient being monitored Patient Re-evaluated:Patient Re-evaluated prior to induction Oxygen Delivery Method: Nasal Cannula

## 2023-06-14 NOTE — Anesthesia Preprocedure Evaluation (Signed)
Anesthesia Evaluation  Patient identified by MRN, date of birth, ID band Patient awake    Reviewed: Allergy & Precautions, H&P , NPO status , Patient's Chart, lab work & pertinent test results, reviewed documented beta blocker date and time   Airway Mallampati: II  TM Distance: >3 FB Neck ROM: full    Dental no notable dental hx.    Pulmonary neg pulmonary ROS, sleep apnea , Patient abstained from smoking.   Pulmonary exam normal breath sounds clear to auscultation       Cardiovascular Exercise Tolerance: Good hypertension, negative cardio ROS  Rhythm:regular Rate:Normal     Neuro/Psych   Anxiety      Neuromuscular disease negative neurological ROS  negative psych ROS   GI/Hepatic negative GI ROS, Neg liver ROS,,,  Endo/Other  diabetes  Morbid obesity  Renal/GU negative Renal ROS  negative genitourinary   Musculoskeletal   Abdominal   Peds  Hematology negative hematology ROS (+)   Anesthesia Other Findings   Reproductive/Obstetrics negative OB ROS                             Anesthesia Physical Anesthesia Plan  ASA: 3  Anesthesia Plan: General   Post-op Pain Management:    Induction:   PONV Risk Score and Plan: Propofol infusion  Airway Management Planned:   Additional Equipment:   Intra-op Plan:   Post-operative Plan:   Informed Consent: I have reviewed the patients History and Physical, chart, labs and discussed the procedure including the risks, benefits and alternatives for the proposed anesthesia with the patient or authorized representative who has indicated his/her understanding and acceptance.     Dental Advisory Given  Plan Discussed with: CRNA  Anesthesia Plan Comments:        Anesthesia Quick Evaluation

## 2023-06-14 NOTE — Interval H&P Note (Signed)
History and Physical Interval Note:  06/14/2023 10:41 AM  Lance Frazier  has presented today for surgery, with the diagnosis of screening for colon cancer.  The various methods of treatment have been discussed with the patient and family. After consideration of risks, benefits and other options for treatment, the patient has consented to  Procedure(s) with comments: COLONOSCOPY WITH PROPOFOL (N/A) - 7:30 am, asa 3 as a surgical intervention.  The patient's history has been reviewed, patient examined, no change in status, stable for surgery.  I have reviewed the patient's chart and labs.  Questions were answered to the patient's satisfaction.     [name] Daniel Ritthaler

## 2023-06-14 NOTE — Discharge Instructions (Signed)
  Discharge instructions Please read the instructions outlined below and refer to this sheet in the next few weeks. These discharge instructions provide you with general information on caring for yourself after you leave the hospital. Your doctor may also give you specific instructions. While your treatment has been planned according to the most current medical practices available, unavoidable complications occasionally occur. If you have any problems or questions after discharge, please call your doctor. ACTIVITY You may resume your regular activity but move at a slower pace for the next 24 hours.  Take frequent rest periods for the next 24 hours.  Walking will help expel (get rid of) the air and reduce the bloated feeling in your abdomen.  No driving for 24 hours (because of the anesthesia (medicine) used during the test).  You may shower.  Do not sign any important legal documents or operate any machinery for 24 hours (because of the anesthesia used during the test).  NUTRITION Drink plenty of fluids.  You may resume your normal diet.  Begin with a light meal and progress to your normal diet.  Avoid alcoholic beverages for 24 hours or as instructed by your caregiver.  MEDICATIONS You may resume your normal medications unless your caregiver tells you otherwise.  WHAT YOU CAN EXPECT TODAY You may experience abdominal discomfort such as a feeling of fullness or "gas" pains.  FOLLOW-UP Your doctor will discuss the results of your test with you.  SEEK IMMEDIATE MEDICAL ATTENTION IF ANY OF THE FOLLOWING OCCUR: Excessive nausea (feeling sick to your stomach) and/or vomiting.  Severe abdominal pain and distention (swelling).  Trouble swallowing.  Temperature over 101 F (37.8 C).  Rectal bleeding or vomiting of blood.     Adequate fluid intake , 8 glass of water daily High fiber diet : Dates, prunes , pears, Kiwi  Metamucil BID        I hope you have a great rest of your week!    Vista Lawman , M.D.. Gastroenterology and Hepatology [facility]

## 2023-06-14 NOTE — Transfer of Care (Signed)
Immediate Anesthesia Transfer of Care Note  Patient: Lance Frazier  Procedure(s) Performed: COLONOSCOPY WITH PROPOFOL  Patient Location: Short Stay  Anesthesia Type:General  Level of Consciousness: drowsy and patient cooperative  Airway & Oxygen Therapy: Patient Spontanous Breathing  Post-op Assessment: Report given to RN and Post -op Vital signs reviewed and stable  Post vital signs: Reviewed and stable  Last Vitals:  Vitals Value Taken Time  BP 115/69 06/14/23 1206  Temp 36.7 C 06/14/23 1206  Pulse 73 06/14/23 1206  Resp 12 06/14/23 1206  SpO2 97 % 06/14/23 1206    Last Pain:  Vitals:   06/14/23 1206  TempSrc: Oral  PainSc: 0-No pain      Patients Stated Pain Goal: 8 (06/14/23 1014)  Complications: No notable events documented.

## 2023-06-14 NOTE — Op Note (Signed)
[facility] Patient Name: Lance Frazier Procedure Date: 06/14/2023 11:21 AM MRN: [id number] Date of Birth: 08/23/1961 Attending MD: Sanjuan Dame , MD, [id number] CSN: [id number] Age: 62 Admit Type: Outpatient Procedure:                Colonoscopy Indications:              Screening for colorectal malignant neoplasm Providers:                Sanjuan Dame, MD, Nena Polio, RN, Elinor Parkinson, Dyann Ruddle Referring MD:              Medicines:                Monitored Anesthesia Care Complications:            No immediate complications. Estimated Blood Loss:     Estimated blood loss: none. Procedure:                Pre-Anesthesia Assessment:                           - Prior to the procedure, a History and Physical                            was performed, and patient medications and                            allergies were reviewed. The patient's tolerance of                            previous anesthesia was also reviewed. The risks                            and benefits of the procedure and the sedation                            options and risks were discussed with the patient.                            All questions were answered, and informed consent                            was obtained. Prior Anticoagulants: The patient has                            taken no anticoagulant or antiplatelet agents. ASA                            Grade Assessment: III - A patient with severe                            systemic disease. After reviewing the risks and  benefits, the patient was deemed in satisfactory                            condition to undergo the procedure.                           After obtaining informed consent, the colonoscope                            was passed under direct vision. Throughout the                            procedure, the patient's blood pressure, pulse, and                             oxygen saturations were monitored continuously. The                            [id number]) scope was introduced through the                            anus and advanced to the the cecum, identified by                            appendiceal orifice and ileocecal valve. The                            colonoscopy was performed without difficulty. The                            patient tolerated the procedure well. The quality                            of the bowel preparation was evaluated using the                            BBPS [facility] Bowel Preparation Scale) with scores                            of: Right Colon = 2 (minor amount of residual                            staining, small fragments of stool and/or opaque                            liquid, but mucosa seen well), Transverse Colon = 2                            (minor amount of residual staining, small fragments                            of stool and/or opaque liquid, but mucosa seen  well) and Left Colon = 2 (minor amount of residual                            staining, small fragments of stool and/or opaque                            liquid, but mucosa seen well). The total BBPS score                            equals 6. The ileocecal valve, appendiceal orifice,                            and rectum were photographed. Scope In: 11:40:47 AM Scope Out: 12:01:01 PM Scope Withdrawal Time: 0 hours 16 minutes 50 seconds  Total Procedure Duration: 0 hours 20 minutes 14 seconds  Findings:      The perianal and digital rectal examinations were normal.      A 5 mm polyp was found in the sigmoid colon. The polyp was sessile. The       polyp was removed with a cold snare. Resection and retrieval were       complete.      A few small-mouthed diverticula were found in the sigmoid colon.      A few patchy angioectasias without bleeding were found in the rectum.      Non-bleeding external internal  hemorrhoids were found during       retroflexion. Impression:               - One 5 mm polyp in the sigmoid colon, removed with                            a cold snare. Resected and retrieved.                           - Diverticulosis in the sigmoid colon.                           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                           - Non-bleeding external internal hemorrhoids. Moderate Sedation:      Per Anesthesia Care Recommendation:           - Repeat colonoscopy for surveillance based on                            pathology results.                           - Patient has a contact number available for                            emergencies. The signs and symptoms of potential  delayed complications were discussed with the                            patient. Return to normal activities tomorrow.                            Written discharge instructions were provided to the                            patient.                           - Resume previous diet.                           - Continue present medications.                           - Await pathology results.                           Adequate fluid intake , 8 glass of water daily                           High fiber diet : Dates, prunes , pears, Kiwi                           Metamucil BID Procedure Code(s):        --- Professional ---                           [phone], Colonoscopy, flexible; with removal of                            tumor(s), polyp(s), or other lesion(s) by snare                            technique Diagnosis Code(s):        --- Professional ---                           Z12.11, Encounter for screening for malignant                            neoplasm of colon                           D12.5, Benign neoplasm of sigmoid colon                           K64.4, Residual hemorrhoidal skin tags                            K64.8, Other hemorrhoids                           K55.20, Angiodysplasia of colon without hemorrhage  K57.30, Diverticulosis of large intestine without                            perforation or abscess without bleeding CPT copyright 2022 American Medical Association. All rights reserved. The codes documented in this report are preliminary and upon coder review may  be revised to meet current compliance requirements. Sanjuan Dame, MD Sanjuan Dame, MD 06/14/2023 12:08:10 PM This report has been signed electronically. Number of Addenda: 0

## 2023-06-17 ENCOUNTER — Encounter (INDEPENDENT_AMBULATORY_CARE_PROVIDER_SITE_OTHER): Payer: Self-pay | Admitting: *Deleted

## 2023-06-18 NOTE — Anesthesia Postprocedure Evaluation (Signed)
Anesthesia Post Note  Patient: Lance Frazier  Procedure(s) Performed: COLONOSCOPY WITH PROPOFOL  Patient location during evaluation: Phase II Anesthesia Type: General Level of consciousness: awake Pain management: pain level controlled Vital Signs Assessment: post-procedure vital signs reviewed and stable Respiratory status: spontaneous breathing and respiratory function stable Cardiovascular status: blood pressure returned to baseline and stable Postop Assessment: no headache and no apparent nausea or vomiting Anesthetic complications: no Comments: Late entry   No notable events documented.   Last Vitals:  Vitals:   06/14/23 1014 06/14/23 1206  BP: (!) 157/88 115/69  Pulse:  73  Resp: 18 12  Temp: 36.6 C 36.7 C  SpO2: 96% 97%    Last Pain:  Vitals:   06/17/23 1418  TempSrc:   PainSc: 0-No pain                 Windell Norfolk

## 2023-06-19 ENCOUNTER — Other Ambulatory Visit: Payer: Self-pay | Admitting: Cardiology

## 2023-06-19 DIAGNOSIS — E7800 Pure hypercholesterolemia, unspecified: Secondary | ICD-10-CM

## 2023-06-19 DIAGNOSIS — I7121 Aneurysm of the ascending aorta, without rupture: Secondary | ICD-10-CM

## 2023-06-19 DIAGNOSIS — I10 Essential (primary) hypertension: Secondary | ICD-10-CM

## 2023-06-19 NOTE — Progress Notes (Signed)
I reviewed the pathology results. Ann, can you send her a letter with the findings as described below please? Repeat colonoscopy in 7 years  Thanks,  Lance Lawman, MD Gastroenterology and Hepatology [facility]  ---------------------------------------------------------------------------------------------  [facility] [address], [place], [place] [address] Phone:  [phone]   06/19/23 [place], [place]   Dear Lance Frazier,  I am writing to inform you that the biopsies taken during your recent endoscopic examination showed: Tubular Adenoma   I am writing to let you know the results of your recent colonoscopy.  You had a total of 1 polyp removed. The pathology came back as "tubular adenoma." These findings are NOT cancer, but had the polyps remained in your colon, they could have turned into cancer.  Given these findings, it is recommended that your next colonoscopy be performed in 7 years.  Please call us at [phone] if you have persistent problems or have questions about your condition that have not been fully answered at this time.  Sincerely,  Lance Lawman, MD Gastroenterology and Hepatology

## 2023-06-20 ENCOUNTER — Encounter (INDEPENDENT_AMBULATORY_CARE_PROVIDER_SITE_OTHER): Payer: Self-pay | Admitting: *Deleted

## 2023-06-21 ENCOUNTER — Encounter (HOSPITAL_COMMUNITY): Payer: Self-pay | Admitting: Gastroenterology

## 2023-06-25 ENCOUNTER — Other Ambulatory Visit: Payer: BC Managed Care – PPO

## 2023-06-26 ENCOUNTER — Ambulatory Visit: Payer: BC Managed Care – PPO

## 2023-06-26 DIAGNOSIS — I7121 Aneurysm of the ascending aorta, without rupture: Secondary | ICD-10-CM | POA: Diagnosis not present

## 2023-06-26 DIAGNOSIS — I10 Essential (primary) hypertension: Secondary | ICD-10-CM | POA: Diagnosis not present

## 2023-06-28 ENCOUNTER — Other Ambulatory Visit: Payer: BC Managed Care – PPO

## 2023-06-30 NOTE — Progress Notes (Signed)
Echocardiogram 06/26/2023: Normal LV systolic function with visual EF 60-65%. Left ventricle cavity is normal in size. Normal global wall motion. Normal diastolic filling pattern, normal LAP. Mild left ventricular hypertrophy.  No significant valvular heart disease.  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 Compared to 12/17/2022 root and proximal ascending aorta dimensions remain stable (39 mm and 46 mm respectively in January 2024).

## 2023-07-04 ENCOUNTER — Ambulatory Visit: Payer: BC Managed Care – PPO | Admitting: Cardiology

## 2023-07-04 ENCOUNTER — Encounter: Payer: Self-pay | Admitting: Cardiology

## 2023-07-04 ENCOUNTER — Other Ambulatory Visit (HOSPITAL_COMMUNITY): Payer: Self-pay

## 2023-07-04 VITALS — BP 135/82 | HR 84 | Resp 16 | Ht 65.0 in | Wt 259.0 lb

## 2023-07-04 DIAGNOSIS — E7800 Pure hypercholesterolemia, unspecified: Secondary | ICD-10-CM | POA: Diagnosis not present

## 2023-07-04 DIAGNOSIS — I10 Essential (primary) hypertension: Secondary | ICD-10-CM | POA: Diagnosis not present

## 2023-07-04 DIAGNOSIS — I7121 Aneurysm of the ascending aorta, without rupture: Secondary | ICD-10-CM

## 2023-07-04 DIAGNOSIS — E1165 Type 2 diabetes mellitus with hyperglycemia: Secondary | ICD-10-CM | POA: Diagnosis not present

## 2023-07-04 MED ORDER — MOUNJARO 7.5 MG/0.5ML ~~LOC~~ SOAJ
7.5000 mg | SUBCUTANEOUS | 1 refills | Status: DC
Start: 2023-07-04 — End: 2023-07-20
  Filled 2023-07-04: qty 6, 84d supply, fill #0

## 2023-07-04 NOTE — Progress Notes (Signed)
Primary Physician/Referring:  Lance Perches, MD  Patient ID: Lance Frazier, male    DOB: [birth year], 62 y.o.   MRN: [id number]  Chief Complaint  Patient presents with   Aneurysm of ascending aorta without rupture    Follow-up    6 month   HPI:    Lance Frazier  is a 62 y.o. Caucasian male patient with elevated coronary calcium score in the 88th percentile on 05/27/2022, moderate ascending aortic aneurysm aorta measuring around 4.5 cm, diabetes mellitus, OSA on CPAP and compliant, hyperlipidemia, primary hypertension, chronic back pain, has spinal stimulator placed sometime in 2015, diagnosis of prostate cancer for which he underwent radiation therapy in 2023 and being followed at [facility], now follows Lance Frazier.  This is a 55-month office visit.  Except for chronic back pain, leg pain, he has no specific complaints.   Past Medical History:  Diagnosis Date   Anxiety    Arthritis    Diabetes mellitus without complication (HCC)    Fibromyalgia    Hyperlipidemia 05/28/2012   Hypertension    Marijuana use    Obesity    BMI of 40.4 at a weight of 265 pounds   Prostate cancer (HCC) 06/08/2022   Prostate cancer [facility])    Sleep apnea    Past Surgical History:  Procedure Laterality Date   back stimulator     COLONOSCOPY  11/19/2009   Dr. Jena Frazier: single external hemorrhoid tag, anal papilla, normal TI   COLONOSCOPY WITH PROPOFOL N/A 06/14/2023   Procedure: COLONOSCOPY WITH PROPOFOL;  Surgeon: Lance Macho, MD;  Location: [facility];  Service: Endoscopy;  Laterality: N/A;  7:30 am, asa 3   HEMORRHOID SURGERY  02/06/2012   Procedure: HEMORRHOIDECTOMY;  Surgeon: Lance Heading, MD;  Location: [facility] ORS;  Service: General;  Laterality: N/A;   HEMORRHOID SURGERY N/A 08/13/2014   Procedure: EXTENSIVE HEMORRHOIDECTOMY;  Surgeon: Lance Heading, MD;  Location: [facility] ORS;  Service: General;  Laterality: N/A;   HERNIA REPAIR     umbilical-[facility]   KNEE ARTHROSCOPY     right x2   LUMBAR FUSION   09/08/2014   TONSILLECTOMY     UVULECTOMY     Family History  Problem Relation Age of Onset   Heart disease Mother    Coronary artery disease Father 23       Acute MI   Heart disease Maternal Aunt    Anesthesia problems Neg Hx    Hypotension Neg Hx    Malignant hyperthermia Neg Hx    Pseudochol deficiency Neg Hx    Colon cancer Neg Hx     Social History   Tobacco Use   Smoking status: Never   Smokeless tobacco: Never  Substance Use Topics   Alcohol use: No    Alcohol/week: 0.0 standard drinks of alcohol   Marital Status: Married  ROS  Review of Systems  Cardiovascular:  Negative for chest pain, dyspnea on exertion and leg swelling.  Musculoskeletal:  Positive for back pain.   Objective  Blood pressure 135/82, pulse 84, resp. rate 16, height 5\' 5"  (1.651 m), weight 259 lb (117.5 kg), SpO2 96%. Body mass index is 43.1 kg/m.     07/04/2023    1:55 PM 06/14/2023   12:06 PM 06/14/2023   10:14 AM  Vitals with BMI  Height 5\' 5"     Weight 259 lbs    BMI 43.1    Systolic 135 115 119  Diastolic 82 69 88  Pulse 84  73     Physical Exam Constitutional:      Appearance: He is morbidly obese.  Neck:     Vascular: No JVD.  Cardiovascular:     Rate and Rhythm: Normal rate and regular rhythm.     Pulses: Intact distal pulses.     Heart sounds: Normal heart sounds. No murmur heard.    No gallop.  Pulmonary:     Effort: Pulmonary effort is normal.     Breath sounds: Normal breath sounds.  Abdominal:     General: Bowel sounds are normal.     Palpations: Abdomen is soft.  Musculoskeletal:     Right lower leg: No edema.     Left lower leg: No edema.    Laboratory examination:      Latest Ref Rng & Units 06/12/2023    9:23 AM 09/09/2014    5:53 AM 08/31/2014    1:43 PM  CMP  Glucose 70 - 99 mg/dL 027  253  664   BUN 8 - 23 mg/dL 17  14  11    Creatinine 0.61 - 1.24 mg/dL 4.03  4.74  2.59   Sodium 135 - 145 mmol/L 135  135  136   Potassium 3.5 - 5.1 mmol/L 4.2  4.5   4.8   Chloride 98 - 111 mmol/L 103  94  99   CO2 22 - 32 mmol/L 24  28  21    Calcium 8.9 - 10.3 mg/dL 8.6  9.4  9.5      External labs:   PSA 03/14/2023: 0.16.  Cholesterol, total 203.000 M 03/09/2022 HDL 30.000 MG 03/09/2022 LDL 112.000 M 03/09/2022 Triglycerides 353.000 M 03/09/2022  A1C 7.500 % 03/09/2022  Hemoglobin 17.700 G/ 12/06/2021 Platelets 199.000 X1 12/06/2021  Creatinine, Serum 1.150 MG/ 12/06/2021 ALT (SGPT) 22.000 IU/ 03/09/2022  Radiology:   Coronary calcium score 05/27/2022: LM 0 LAD 265 LCx 6.6 RCA 232 Total Agatston score 503. MESA database percentile: 88 Ascending aorta mildly dilated at 43 mm.  Descending aorta is normal at 29.  Scattered aortic calcification is evident.  Recommend annual screening.  Cardiac Studies:     PCV MYOCARDIAL PERFUSION WITH LEXISCAN 05/30/2022  Narrative Exercise Sestamibi stress test 05/30/2022: Exercise nuclear stress test was performed using Bruce protocol. 1 Day Rest and Stress images. Exercise time 7 minutes 2 seconds, achieved 8.62 METS, 89% APMHR. Stress ECG negative for ischemia. Normal myocardial perfusion with apical thinning artifact (normal variant), no obvious evidence of reversible myocardial ischemia or prior infarct. Left ventricular size preserved, calculated LVEF 71%, no regional wall motion abnormalities. Prior study 09/06/2014 reported normal myocardial perfusion with apical thinning and small size insignificant inferoapical ischemia cannot be excluded. Low risk study.  PCV ECHOCARDIOGRAM COMPLETE 06/26/2023  Narrative Echocardiogram 06/26/2023: Normal LV systolic function with visual EF 60-65%. Left ventricle cavity is normal in size. Normal global wall motion. Normal diastolic filling pattern, normal LAP. Mild left ventricular hypertrophy. No significant valvular heart disease. 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 Compared to 12/17/2022 root and proximal ascending  aorta dimensions remain stable (39 mm and 46 mm respectively in January 2024).    EKG:   EKG 07/04/2023: Normal sinus rhythm with rate of 76 bpm, normal axis, incomplete right bundle branch block.  Low-voltage complexes.  Compared to 12/21/2022, no significant change.   Medications and allergies   Allergies  Allergen Reactions   Lisinopril Cough    Current Outpatient Medications:    aspirin (ASPIRIN CHILDRENS)  81 MG chewable tablet, Chew 1 tablet (81 mg total) by mouth daily., Disp: , Rfl:    baclofen (LIORESAL) 10 MG tablet, Take 10 mg by mouth daily as needed for muscle spasms., Disp: , Rfl:    glimepiride (AMARYL) 4 MG tablet, Take 2 mg by mouth daily with breakfast. Take 1/2 tab daily starting 07/04/23, Disp: , Rfl: 3   ibuprofen (ADVIL) 400 MG tablet, Take 400 mg by mouth as needed., Disp: , Rfl:    JARDIANCE 25 MG TABS tablet, Take 25 mg by mouth daily., Disp: , Rfl:    losartan (COZAAR) 50 MG tablet, TAKE 1 TABLET(50 MG) BY MOUTH EVERY EVENING, Disp: 90 tablet, Rfl: 3   pioglitazone (ACTOS) 30 MG tablet, Take 30 mg by mouth daily., Disp: , Rfl:    polyethylene glycol powder (GLYCOLAX/MIRALAX) 17 GM/SCOOP powder, Take 8.5 g by mouth daily., Disp: , Rfl:    pregabalin (LYRICA) 100 MG capsule, Take 100 mg by mouth 2 (two) times daily., Disp: , Rfl:    rosuvastatin (CRESTOR) 20 MG tablet, TAKE 1 TABLET(20 MG) BY MOUTH DAILY, Disp: 90 tablet, Rfl: 3   tadalafil (CIALIS) 20 MG tablet, TAKE 1 TABLET(20 MG) BY MOUTH DAILY AS NEEDED FOR ERECTILE DYSFUNCTION (Patient taking differently: Take 20 mg by mouth daily.), Disp: 30 tablet, Rfl: 1   tirzepatide (MOUNJARO) 7.5 MG/0.5ML Pen, Inject 7.5 mg into the skin once a week., Disp: 6 mL, Rfl: 1   glucose blood test strip, 1 each by Other route See admin instructions. Check blood sugar daily., Disp: , Rfl:    Assessment     ICD-10-CM   1. Aneurysm of ascending aorta without rupture (HCC)  I71.21 EKG 12-Lead    PCV ECHOCARDIOGRAM COMPLETE     2. Type 2 diabetes mellitus with hyperglycemia, with long-term current use of insulin (HCC)  E11.65 tirzepatide (MOUNJARO) 7.5 MG/0.5ML Pen   Z79.4 CBC    TSH    Lipid Panel With LDL/HDL Ratio    LDL cholesterol, direct    Hgb A1c w/o eAG    3. Pure hypercholesterolemia  E78.00 Lipid Panel With LDL/HDL Ratio    LDL cholesterol, direct       Medications Discontinued During This Encounter  Medication Reason   nabumetone (RELAFEN) 750 MG tablet    metFORMIN (GLUCOPHAGE) 1000 MG tablet Discontinued by provider   OZEMPIC, 2 MG/DOSE, 8 MG/3ML SOPN Change in therapy      Meds ordered this encounter  Medications   tirzepatide (MOUNJARO) 7.5 MG/0.5ML Pen    Sig: Inject 7.5 mg into the skin once a week.    Dispense:  6 mL    Refill:  1   Orders Placed This Encounter  Procedures   CBC   TSH   Lipid Panel With LDL/HDL Ratio   LDL cholesterol, direct   Hgb A1c w/o eAG   EKG 12-Lead   PCV ECHOCARDIOGRAM COMPLETE    Standing Status:   Future    Standing Expiration Date:   07/03/2024   Recommendations:   Lance Frazier is a 62 y.o. Caucasian male patient with elevated coronary calcium score in the 88th percentile on 05/27/2022, moderate ascending aortic aneurysm aorta measuring around 4.5 cm, diabetes mellitus, OSA on CPAP and compliant, hyperlipidemia, primary hypertension, chronic back pain, has spinal stimulator placed sometime in 2015, diagnosis of prostate cancer for which he underwent radiation therapy in 2023 and being followed at [facility], now follows Lance Frazier.  This is a 70-month office visit.  1. Aneurysm of ascending aorta without rupture (HCC) I reviewed the results of the echocardiogram.  Fortunately there is a good correlation between CT scan and echocardiogram with regard to ascending aortic aneurysm size.  Will continue annual surveillance with repeat echocardiogram in a year.  He is presently on losartan for ascending aortic aneurysm, continue the same. - EKG 12-Lead -  PCV ECHOCARDIOGRAM COMPLETE; Future  2. Type 2 diabetes mellitus with hyperglycemia, with long-term current use of insulin (HCC) Patient with uncontrolled diabetes, but since starting GLP-1 agonist, diabetes has now improved and well-controlled.  He is also lost weight, we will go ahead and discontinue Ozempic and switch him back to [facility], previously changed due to shortage. Patient will discontinue metformin as he has now developed diarrhea.  I also advised him to reduce the dose of the glimepiride from 4 mg to 2 mg as his blood sugar is now well-controlled and hopefully this will also reduce risk of hypoglycemia and also hopefully help with continued weight loss by not inducing hypoglycemia and excess appetite.  If blood sugars continue to be well-controlled, hopefully will be able to take him off of glipizide completely.  He is presently on Jardiance as well.  If he tolerates Mounjaro at 7.5 mg, will uptitrate to 15 mg for continued weight loss and diabetes control.  Patient will contact me about tolerability.  His wife present and all questions answered.  - tirzepatide (MOUNJARO) 7.5 MG/0.5ML Pen; Inject 7.5 mg into the skin once a week.  Dispense: 6 mL; Refill: 1 - CBC - TSH - Lipid Panel With LDL/HDL Ratio - LDL cholesterol, direct - Hgb A1c w/o eAG  3. Pure hypercholesterolemia Lab orders placed to check lipids - Lipid Panel With LDL/HDL Ratio - LDL cholesterol, direct  Other orders - ibuprofen (ADVIL) 400 MG tablet; Take 400 mg by mouth as needed. - pregabalin (LYRICA) 100 MG capsule; Take 100 mg by mouth 2 (two) times daily.  Otherwise stable from cardiac standpoint, glad to see him back in 1 year with repeat echocardiogram for follow-up.     Lance Decamp, MD, [facility] 07/06/2023, 9:00 PM Office: [phone]

## 2023-07-10 DIAGNOSIS — Z6841 Body Mass Index (BMI) 40.0 and over, adult: Secondary | ICD-10-CM | POA: Diagnosis not present

## 2023-07-10 DIAGNOSIS — R29818 Other symptoms and signs involving the nervous system: Secondary | ICD-10-CM | POA: Diagnosis not present

## 2023-07-18 ENCOUNTER — Other Ambulatory Visit: Payer: Self-pay | Admitting: Cardiology

## 2023-07-18 DIAGNOSIS — N5235 Erectile dysfunction following radiation therapy: Secondary | ICD-10-CM

## 2023-07-19 ENCOUNTER — Other Ambulatory Visit (HOSPITAL_COMMUNITY): Payer: Self-pay

## 2023-07-19 DIAGNOSIS — Z794 Long term (current) use of insulin: Secondary | ICD-10-CM | POA: Diagnosis not present

## 2023-07-19 DIAGNOSIS — E1165 Type 2 diabetes mellitus with hyperglycemia: Secondary | ICD-10-CM | POA: Diagnosis not present

## 2023-07-19 DIAGNOSIS — E7800 Pure hypercholesterolemia, unspecified: Secondary | ICD-10-CM | POA: Diagnosis not present

## 2023-07-20 ENCOUNTER — Other Ambulatory Visit: Payer: Self-pay | Admitting: Cardiology

## 2023-07-20 DIAGNOSIS — E1165 Type 2 diabetes mellitus with hyperglycemia: Secondary | ICD-10-CM

## 2023-07-20 LAB — LIPID PANEL WITH LDL/HDL RATIO
Cholesterol, Total: 134 mg/dL (ref 100–199)
HDL: 29 mg/dL — ABNORMAL LOW (ref >39)
LDL Chol Calc (NIH): 64 mg/dL (ref 0–99)
LDL/HDL Ratio: 2.2 ratio (ref 0.0–3.6)
Triglycerides: 253 mg/dL — ABNORMAL HIGH (ref 0–149)
VLDL Cholesterol Cal: 41 mg/dL — ABNORMAL HIGH (ref 5–40)

## 2023-07-20 LAB — CBC
Hematocrit: 46.6 % (ref 37.5–51.0)
Hemoglobin: 15.5 g/dL (ref 13.0–17.7)
MCH: 28.6 pg (ref 26.6–33.0)
MCHC: 33.3 g/dL (ref 31.5–35.7)
MCV: 86 fL (ref 79–97)
Platelets: 181 10*3/uL (ref 150–450)
RBC: 5.42 x10E6/uL (ref 4.14–5.80)
RDW: 13.6 % (ref 11.6–15.4)
WBC: 6.9 10*3/uL (ref 3.4–10.8)

## 2023-07-20 LAB — LDL CHOLESTEROL, DIRECT: LDL Direct: 65 mg/dL (ref 0–99)

## 2023-07-20 LAB — HGB A1C W/O EAG: Hgb A1c MFr Bld: 7.6 % — ABNORMAL HIGH (ref 4.8–5.6)

## 2023-07-20 LAB — TSH: TSH: 1.37 u[IU]/mL (ref 0.450–4.500)

## 2023-07-20 MED ORDER — MOUNJARO 15 MG/0.5ML ~~LOC~~ SOAJ
15.0000 mg | SUBCUTANEOUS | 3 refills | Status: DC
Start: 2023-07-20 — End: 2024-06-27
  Filled 2023-07-20 – 2023-07-23 (×2): qty 2, 28d supply, fill #0
  Filled 2023-07-31: qty 6, 84d supply, fill #0
  Filled 2023-10-20: qty 6, 84d supply, fill #1
  Filled 2024-01-23: qty 6, 84d supply, fill #2
  Filled 2024-04-10: qty 6, 84d supply, fill #3

## 2023-07-20 NOTE — Progress Notes (Signed)
ICD-10-CM   1. Type 2 diabetes mellitus with hyperglycemia, with long-term current use of insulin (HCC)  E11.65 tirzepatide (MOUNJARO) 15 MG/0.5ML Pen   Z79.4      Meds ordered this encounter  Medications   tirzepatide (MOUNJARO) 15 MG/0.5ML Pen    Sig: Inject 15 mg into the skin once a week.    Dispense:  6 mL    Refill:  3    Medications Discontinued During This Encounter  Medication Reason   tirzepatide (MOUNJARO) 7.5 MG/0.5ML Pen Dose change

## 2023-07-23 ENCOUNTER — Other Ambulatory Visit: Payer: Self-pay | Admitting: Neurosurgery

## 2023-07-23 ENCOUNTER — Other Ambulatory Visit (HOSPITAL_COMMUNITY): Payer: Self-pay

## 2023-07-23 ENCOUNTER — Other Ambulatory Visit: Payer: Self-pay

## 2023-07-23 DIAGNOSIS — R29818 Other symptoms and signs involving the nervous system: Secondary | ICD-10-CM

## 2023-07-31 ENCOUNTER — Other Ambulatory Visit (HOSPITAL_COMMUNITY): Payer: Self-pay

## 2023-08-07 ENCOUNTER — Encounter: Payer: BC Managed Care – PPO | Admitting: Gastroenterology

## 2023-08-07 NOTE — Discharge Instructions (Signed)

## 2023-08-08 ENCOUNTER — Ambulatory Visit
Admission: RE | Admit: 2023-08-08 | Discharge: 2023-08-08 | Disposition: A | Discharge status: Home / Self Care | Payer: BC Managed Care – PPO | Source: Ambulatory Visit | Attending: Neurosurgery | Admitting: Neurosurgery

## 2023-08-08 DIAGNOSIS — M4807 Spinal stenosis, lumbosacral region: Secondary | ICD-10-CM | POA: Diagnosis not present

## 2023-08-08 DIAGNOSIS — M5126 Other intervertebral disc displacement, lumbar region: Secondary | ICD-10-CM | POA: Diagnosis not present

## 2023-08-08 DIAGNOSIS — M48061 Spinal stenosis, lumbar region without neurogenic claudication: Secondary | ICD-10-CM | POA: Diagnosis not present

## 2023-08-08 DIAGNOSIS — R29818 Other symptoms and signs involving the nervous system: Secondary | ICD-10-CM

## 2023-08-08 MED ORDER — ONDANSETRON HCL 4 MG/2ML IJ SOLN: 4.0000 mg | Freq: Once | INTRAMUSCULAR | Status: DC | PRN

## 2023-08-08 MED ORDER — DIAZEPAM 5 MG PO TABS: 10.0000 mg | ORAL_TABLET | Freq: Once | ORAL | Status: DC

## 2023-08-08 MED ORDER — IOPAMIDOL (ISOVUE-M 200) INJECTION 41%
20.0000 mL | Freq: Once | INTRAMUSCULAR | Status: AC
  Administered 2023-08-08: 20 mL via INTRATHECAL

## 2023-08-08 MED ORDER — MEPERIDINE HCL 50 MG/ML IJ SOLN: 50.0000 mg | Freq: Once | INTRAMUSCULAR | Status: DC | PRN

## 2023-08-08 NOTE — Progress Notes (Signed)
Pt repeorts his spinal cord stimulator has been turned off for myelogram procedure.

## 2023-08-16 DIAGNOSIS — M48062 Spinal stenosis, lumbar region with neurogenic claudication: Secondary | ICD-10-CM | POA: Diagnosis not present

## 2023-08-21 DIAGNOSIS — I712 Thoracic aortic aneurysm, without rupture, unspecified: Secondary | ICD-10-CM | POA: Diagnosis not present

## 2023-08-21 DIAGNOSIS — E1140 Type 2 diabetes mellitus with diabetic neuropathy, unspecified: Secondary | ICD-10-CM | POA: Diagnosis not present

## 2023-08-21 DIAGNOSIS — E785 Hyperlipidemia, unspecified: Secondary | ICD-10-CM | POA: Diagnosis not present

## 2023-08-21 DIAGNOSIS — M47892 Other spondylosis, cervical region: Secondary | ICD-10-CM | POA: Diagnosis not present

## 2023-08-28 DIAGNOSIS — Z9689 Presence of other specified functional implants: Secondary | ICD-10-CM | POA: Diagnosis not present

## 2023-08-28 DIAGNOSIS — M48062 Spinal stenosis, lumbar region with neurogenic claudication: Secondary | ICD-10-CM | POA: Diagnosis not present

## 2023-09-05 ENCOUNTER — Ambulatory Visit (HOSPITAL_COMMUNITY): Payer: BC Managed Care – PPO | Attending: Neurosurgery

## 2023-09-05 DIAGNOSIS — R262 Difficulty in walking, not elsewhere classified: Secondary | ICD-10-CM | POA: Insufficient documentation

## 2023-09-05 DIAGNOSIS — M5459 Other low back pain: Secondary | ICD-10-CM | POA: Insufficient documentation

## 2023-09-05 NOTE — Therapy (Addendum)
[facility] OUTPATIENT PHYSICAL THERAPY EVALUATION (THORACOLUMBAR)  PHYSICAL THERAPY DISCHARGE SUMMARY  Visits from Start of Care: 1  Current functional level related to goals / functional outcomes: See below   Remaining deficits: See below   Education / Equipment: N/a   Patient agrees to discharge. Patient goals were not met. Patient is being discharged due to not returning since the last visit.   Patient Name: Lance Frazier MRN: [id number] DOB:April 25, 1961, 62 y.o., male Today's Date: 09/05/2023  END OF SESSION:    PT End of Session - 09/05/23 1607     Visit Number 1    Number of Visits 5    Authorization Type [employer]    Authorization Time Period 30 visits combine    Authorization - Visit Number 1    Authorization - Number of Visits 30    PT Start Time 0145    PT Stop Time 0225    PT Time Calculation (min) 40 min    Activity Tolerance Patient tolerated treatment well    Behavior During Therapy [facility] for tasks assessed/performed              Past Medical History:  Diagnosis Date   Anxiety    Arthritis    Diabetes mellitus without complication (HCC)    Fibromyalgia    Hyperlipidemia 05/28/2012   Hypertension    Marijuana use    Obesity    BMI of 40.4 at a weight of 265 pounds   Prostate cancer (HCC) 06/08/2022   Prostate cancer (HCC)    Sleep apnea    Past Surgical History:  Procedure Laterality Date   back stimulator     COLONOSCOPY  11/19/2009   Dr. Jena Gauss: single external hemorrhoid tag, anal papilla, normal TI   COLONOSCOPY WITH PROPOFOL N/A 06/14/2023   Procedure: COLONOSCOPY WITH PROPOFOL;  Surgeon: Franky Macho, MD;  Location[facility];  Service: Endoscopy;  Laterality: N/A;  7:30 am, asa 3   HEMORRHOID SURGERY  02/06/2012   Procedure: HEMORRHOIDECTOMY;  Surgeon: Dalia Heading, MD;  Location: [facility] ORS;  Service: General;  Laterality: N/A;   HEMORRHOID SURGERY N/A 08/13/2014   Procedure: EXTENSIVE HEMORRHOIDECTOMY;  Surgeon: Dalia Heading, MD;   Location: [facility];  Service: General;  Laterality: N/A;   HERNIA REPAIR     umbilical-[facility]   KNEE ARTHROSCOPY     right x2   LUMBAR FUSION  09/08/2014   TONSILLECTOMY     UVULECTOMY     Patient Active Problem List   Diagnosis Date Noted   Hypersomnia 12/29/2015   Exertional dyspnea 12/29/2015   Spondylolisthesis of lumbar region 09/08/2014   Severe obesity (BMI >= 40) (HCC) 01/18/2014   Hyperlipidemia 05/28/2012   Obesity    Hypertension 05/27/2012   Type 2 diabetes mellitus with obesity (HCC) 05/27/2012   Obstructive sleep apnea 05/27/2012   Marijuana use 05/27/2012   Fibromyalgia 05/27/2012   Chronic anxiety 05/27/2012   KNEE PAIN, RIGHT 09/22/2007   PLANTAR FASCIITIS, RIGHT 09/22/2007    PCP: Carylon Perches, MDRef Provider (PCP)   REFERRING PROVIDER:   Tressie Stalker, MD     REFERRING DIAG: [phone] (ICD-10-CM) - Spinal stenosis, lumbar region with neurogenic claudication   Rationale for Evaluation and Treatment: Habilitation  THERAPY DIAG:  Difficulty in walking, not elsewhere classified  Other low back pain  ONSET DATE: 6 years + --------------------------------------------------------------------------------------------- SUBJECTIVE:  SUBJECTIVE STATEMENT: Patient with chronic LBP 6+ years. Patient states since last summer, pt has been having problems with LBP and recently got around to finally getting around to going to MD. [name] to [facility] 9/19 for an CT and Myleography; MRI  pending after PT.   PERTINENT HISTORY:  2015 L4L5 Fusion  Cervical issues DM II Back stimulator  PAIN:  Are you having pain? Yes: NPRS scale: 2/10 Pain location: left sided lumbar  Pain description: stabbing,  Aggravating factors: standing  Relieving factors: rest   PRECAUTIONS: None  RED  FLAGS: None   WEIGHT BEARING RESTRICTIONS: No  FALLS:  Has patient fallen in last 6 months? No  LIVING ENVIRONMENT: Lives with: lives with their spouse Lives in: House/apartment Stairs:  2 sets to get to bedroom Has following equipment at home: None How many hours do you sleep every night: 8  OCCUPATION: Retired   PLOF: Independent   PATIENT GOALS: To avoid surgery   NEXT MD VISIT: TBD --------------------------------------------------------------------------------------------- OBJECTIVE:   DIAGNOSTIC FINDINGS:  IMPRESSION: 1. Solid fusion at L4-5 without significant central or foraminal stenosis. 2. Right paramedian inferior disc extrusion at L5-S1 extends 10 mm below the superior endplate of S1. This contacts and displaces the traversing right S1 nerve roots. 3. Moderate foraminal stenosis bilaterally at L5-S1. 4. Moderate central canal stenosis at L2-3 with crowding of the nerve roots. Subarticular narrowing is worse left than right. 5. Moderate foraminal stenosis bilaterally at L2-3 is worse on the left. 6. Mild central canal stenosis and moderate foraminal stenosis bilaterally at L3-4. 7.  Aortic Atherosclerosis (ICD10-I70.0).  PATIENT SURVEYS:  FOTO [phone]  SCREENING FOR RED FLAGS: Bowel or bladder incontinence: No Spinal tumors: No Cauda equina syndrome: No Compression fracture: No Abdominal aneurysm: No  COGNITION: Overall cognitive status:     POSTURE: No Significant postural limitations      FUNCTIONAL TESTS:  5 times sit to stand: 16.73s with minimal UE assist    GAIT ANALYSIS: Distance walked: 78ft Assistive device utilized: None Level of assistance: Complete Independence Comments: Decreased LU lordosis; minimal antalgia   SENSATION: WFL   LUMBAR ROM:   AROM eval  Flexion Mid shin  Extension Unable- pain  Right lateral flexion Mid thigh   Left lateral flexion Mid thigh   Right rotation   Left rotation    (Blank rows = not  tested; * = limited by pain)  LOWER EXTREMITY MMT:    Left ankle/hip/knee grossly 4-/5 except:  Right ankle/hip/knee grossly 4-/5 except:   LOWER EXTREMITY ROM:     Left ankle/hip/knee AROM grossly WFL except:  Right ankle/hip/knee AROM grossly WFL except:   LUMBAR SPECIAL TESTS:  Not indicitae due to fusion   --------------------------------------------------------------------------------------------- TODAY'S TREATMENT:                                                                                                                              DATE:   09/05/23  PT Eval- Low  PATIENT EDUCATION:  Education details: HEP Person educated: Patient Education method: Medical illustrator Education comprehension: verbalized understanding  HOME EXERCISE PROGRAM: TBD --------------------------------------------------------------------------------------------- ASSESSMENT:  CLINICAL IMPRESSION: Patient is a 62 y.o. y.o. male who was seen today for physical therapy evaluation and treatment for difficulty walking, other low back pain. Patient presents to PT with the following objective impairments: Abnormal gait, decreased endurance, difficulty walking, decreased strength, improper body mechanics, postural dysfunction, and pain. These impairments limit the patient in activities such as carrying, lifting, bending, standing, squatting, stairs, and locomotion level. These impairments also limit the patient in participation such as meal prep, cleaning, laundry, personal finances, interpersonal relationship, driving, shopping, community activity, and occupation. The patient will benefit from PT to address the limitations/impairments listed below to return to their prior level of function in the domains of activity and participation.    PERSONAL FACTORS:  n/a  are also affecting patient's functional outcome.   REHAB POTENTIAL: Good  CLINICAL DECISION MAKING:  Stable/uncomplicated  EVALUATION COMPLEXITY: Low  --------------------------------------------------------------------------------------------- GOALS: Goals reviewed with patient? No  SHORT TERM GOALS: see long term    LONG TERM GOALS: Target date: 5 visits   Patient will be able to score a >/= 45 on the FOTO    to demonstrate an improvement in overall housework, ADL completion, mobility, and self-care.Baseline:  Goal status: INITIAL  2.   Patient will be able to reach ankle with a forward bend with no pain to demonstrate an improvement lower extremity mobility needed for home and community ambulation  Baseline:  Goal status: INITIAL  3.  Patient will be independent with a comprehensive strengthening HEP  Baseline:  Goal status: INITIAL  --------------------------------------------------------------------------------------------- PLAN:  PT FREQUENCY:     PT DURATION:  5 sessions  PLANNED INTERVENTIONS: 97110-Therapeutic exercises, 97530- Therapeutic activity, [id number]- Neuromuscular re-education, 97535- Self Care, [id number]- Manual therapy, [id number]- Gait training, Dry Needling, Joint mobilization, Joint manipulation, Spinal manipulation, Spinal mobilization, Cryotherapy, and Moist heat.  PLAN FOR NEXT SESSION: Stenosis HEP    Seymour Bars, PT 09/05/2023, 4:13 PM

## 2023-09-17 ENCOUNTER — Encounter (HOSPITAL_COMMUNITY): Payer: BC Managed Care – PPO

## 2023-09-26 DIAGNOSIS — R972 Elevated prostate specific antigen [PSA]: Secondary | ICD-10-CM | POA: Diagnosis not present

## 2023-09-26 DIAGNOSIS — C61 Malignant neoplasm of prostate: Secondary | ICD-10-CM | POA: Diagnosis not present

## 2023-10-21 ENCOUNTER — Other Ambulatory Visit (HOSPITAL_COMMUNITY): Payer: Self-pay

## 2023-11-07 DIAGNOSIS — E119 Type 2 diabetes mellitus without complications: Secondary | ICD-10-CM | POA: Diagnosis not present

## 2023-11-07 DIAGNOSIS — M48062 Spinal stenosis, lumbar region with neurogenic claudication: Secondary | ICD-10-CM | POA: Diagnosis not present

## 2023-12-04 DIAGNOSIS — E1140 Type 2 diabetes mellitus with diabetic neuropathy, unspecified: Secondary | ICD-10-CM | POA: Diagnosis not present

## 2023-12-10 DIAGNOSIS — I712 Thoracic aortic aneurysm, without rupture, unspecified: Secondary | ICD-10-CM | POA: Diagnosis not present

## 2023-12-10 DIAGNOSIS — E1140 Type 2 diabetes mellitus with diabetic neuropathy, unspecified: Secondary | ICD-10-CM | POA: Diagnosis not present

## 2023-12-10 DIAGNOSIS — I10 Essential (primary) hypertension: Secondary | ICD-10-CM | POA: Diagnosis not present

## 2023-12-25 ENCOUNTER — Other Ambulatory Visit: Payer: Self-pay | Admitting: Cardiology

## 2023-12-25 DIAGNOSIS — N5235 Erectile dysfunction following radiation therapy: Secondary | ICD-10-CM

## 2023-12-25 NOTE — Telephone Encounter (Signed)
 Pt's pharmacy is requesting a refill on tadalafil . Would Dr. Berry Bristol like to refill this medication? Please addresss

## 2023-12-26 DIAGNOSIS — M79672 Pain in left foot: Secondary | ICD-10-CM | POA: Diagnosis not present

## 2023-12-26 DIAGNOSIS — M722 Plantar fascial fibromatosis: Secondary | ICD-10-CM | POA: Diagnosis not present

## 2024-01-10 ENCOUNTER — Other Ambulatory Visit (HOSPITAL_COMMUNITY): Payer: Self-pay

## 2024-01-20 ENCOUNTER — Telehealth: Payer: Self-pay | Admitting: Gastroenterology

## 2024-01-20 NOTE — Telephone Encounter (Signed)
 Patient called in to say he is ready to schedule the banding.  I know there will be days that Toni Amend will have help and just wanted to see when to schedule him.

## 2024-03-04 DIAGNOSIS — Z9689 Presence of other specified functional implants: Secondary | ICD-10-CM | POA: Diagnosis not present

## 2024-03-04 DIAGNOSIS — M542 Cervicalgia: Secondary | ICD-10-CM | POA: Diagnosis not present

## 2024-03-04 DIAGNOSIS — M961 Postlaminectomy syndrome, not elsewhere classified: Secondary | ICD-10-CM | POA: Diagnosis not present

## 2024-03-05 DIAGNOSIS — E1140 Type 2 diabetes mellitus with diabetic neuropathy, unspecified: Secondary | ICD-10-CM | POA: Diagnosis not present

## 2024-03-09 NOTE — Progress Notes (Unsigned)
   [facility] Banding Procedure Note:   Lance Frazier is a 63 y.o. male presenting today for consideration of hemorrhoid banding. Last colonoscopy 06/14/2023 with 5 mm polyp in sigmoid colon, sigmoid diverticulosis, and few nonbleeding colonic angiectasia's consistent with radiation proctoscopy as well as nonbleeding external and internal hemorrhoids.  Will plan for anoscopy first to evaluate anal canal and then potentially proceed with hemorrhoid banding.  Latex Allergy: No  Interval History: Has been doing well overall since last colonoscopy, has not had any bleeding but has had some occasional itching ankle pain.   The patient presents with symptomatic grade 1/2 hemorrhoids, unresponsive to maximal medical therapy, requesting rubber band ligation of his/her hemorrhoidal disease. All risks, benefits, and alternative forms of therapy were described and informed consent was obtained.  In the left lateral decubitus position anoscopic examination revealed grade 1/2 hemorrhoids in the right posterior and left lateral positions, mild hemorrhoids noted to the right anterior position, no active bleeding identified.  Mild erythema noted to the perianal canal however no external hemorrhoids identified.  Discussed performing this procedure with Dr. Riley Cheadle prior to decision to band today.   The decision was made to band the right posterior internal hemorrhoid with avoidance of right anterior column along the aspect of treatment of prostate at site of prior radiation, and the [facility] System was used to perform band ligation without complication. Digital anorectal examination was then performed to assure proper positioning of the band, and to adjust the banded tissue as required. The patient was discharged home without pain or other issues. Dietary and behavioral recommendations were given and along with follow-up instructions. The patient will return as needed for follow up and additional banding as  required.  No complications were encountered and the patient tolerated the procedure well.    [name], MSN, FNP-BC, AGACNP-BC [facility]

## 2024-03-10 ENCOUNTER — Encounter: Payer: Self-pay | Admitting: Gastroenterology

## 2024-03-10 ENCOUNTER — Ambulatory Visit (INDEPENDENT_AMBULATORY_CARE_PROVIDER_SITE_OTHER): Admitting: Gastroenterology

## 2024-03-10 VITALS — BP 135/79 | HR 77 | Temp 98.6°F | Ht 65.0 in

## 2024-03-10 DIAGNOSIS — K641 Second degree hemorrhoids: Secondary | ICD-10-CM

## 2024-03-10 DIAGNOSIS — E1140 Type 2 diabetes mellitus with diabetic neuropathy, unspecified: Secondary | ICD-10-CM | POA: Diagnosis not present

## 2024-03-10 DIAGNOSIS — J189 Pneumonia, unspecified organism: Secondary | ICD-10-CM | POA: Diagnosis not present

## 2024-03-10 NOTE — Patient Instructions (Addendum)
 Continue to avoid straining. Limit toilet time to 2-3 minutes at the most.   Avoid constipation. Take 2 tablespoons of natural wheat bran, natural oat bran, flax, Benefiber or any over the counter fiber supplement and increase your water intake to 7-8 glasses daily.  Occasionally, you may have more bleeding than usual after the banding procedure. This is often from the untreated hemorrhoids rather than the treated one. Don't be concerned if there is a tablespoon or so of blood. If there is more blood than this, lie flat with your bottom higher than your head and apply an ice pack to the area. If the bleeding does not stop within a half an hour or if you feel faint, have severe pain, chills, fever or difficulty passing urine (very rare) or other problems, you should call us  at [phone] or report to the nearest emergency room. Please call me with any concerns!  The procedure you have had should have been relatively painless since the banding of the area involved does not have nerve endings and there is no pain sensation. The rubber band cuts off the blood supply to the hemorrhoid and the band may fall off as soon as 48 hours after the banding (the band may occasionally be seen in the toilet bowl following a bowel movement). You may notice a temporary feeling of fullness in the rectum which should respond adequately to plain Tylenol  or Motrin.  The second period of time where you may see some additional bleeding is about 10-14 days after the procedure and the scab falls off.  Follow-up as needed.  For now given your history of radiation, I would prefer to treat conservatively however within the next couple months if you start having any flares of your hemorrhoids with pain or bleeding please let me know and happy to take another look and consider additional banding if needed.   [name], MSN, FNP-BC, AGACNP-BC [facility]

## 2024-03-23 DIAGNOSIS — M47812 Spondylosis without myelopathy or radiculopathy, cervical region: Secondary | ICD-10-CM | POA: Diagnosis not present

## 2024-03-26 DIAGNOSIS — C61 Malignant neoplasm of prostate: Secondary | ICD-10-CM | POA: Diagnosis not present

## 2024-04-07 DIAGNOSIS — M47812 Spondylosis without myelopathy or radiculopathy, cervical region: Secondary | ICD-10-CM | POA: Diagnosis not present

## 2024-04-15 ENCOUNTER — Other Ambulatory Visit (HOSPITAL_COMMUNITY): Payer: Self-pay

## 2024-04-21 DIAGNOSIS — M47812 Spondylosis without myelopathy or radiculopathy, cervical region: Secondary | ICD-10-CM | POA: Diagnosis not present

## 2024-04-29 ENCOUNTER — Telehealth: Payer: Self-pay | Admitting: Cardiology

## 2024-04-29 NOTE — Telephone Encounter (Signed)
 Patient called with severe myalgias that he is unable to tolerate that started a few weeks ago.  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  No dark urine.  Will follow-up in 2 to 3 weeks.

## 2024-05-13 ENCOUNTER — Other Ambulatory Visit: Payer: Self-pay | Admitting: Cardiology

## 2024-05-13 DIAGNOSIS — N5235 Erectile dysfunction following radiation therapy: Secondary | ICD-10-CM

## 2024-05-15 NOTE — Telephone Encounter (Signed)
 Pt's pharmacy is requesting a refill on medication tadalafil . Would Dr. Berry Bristol like to refill this non cardiac medication? Please address

## 2024-05-21 DIAGNOSIS — G894 Chronic pain syndrome: Secondary | ICD-10-CM | POA: Diagnosis not present

## 2024-05-25 DIAGNOSIS — M47812 Spondylosis without myelopathy or radiculopathy, cervical region: Secondary | ICD-10-CM | POA: Diagnosis not present

## 2024-05-28 DIAGNOSIS — K1121 Acute sialoadenitis: Secondary | ICD-10-CM | POA: Diagnosis not present

## 2024-06-11 DIAGNOSIS — S90921A Unspecified superficial injury of right foot, initial encounter: Secondary | ICD-10-CM | POA: Diagnosis not present

## 2024-06-11 DIAGNOSIS — H6092 Unspecified otitis externa, left ear: Secondary | ICD-10-CM | POA: Diagnosis not present

## 2024-06-22 ENCOUNTER — Other Ambulatory Visit: Payer: Self-pay | Admitting: Cardiology

## 2024-06-22 DIAGNOSIS — I10 Essential (primary) hypertension: Secondary | ICD-10-CM

## 2024-06-22 DIAGNOSIS — E7800 Pure hypercholesterolemia, unspecified: Secondary | ICD-10-CM

## 2024-06-22 DIAGNOSIS — I7121 Aneurysm of the ascending aorta, without rupture: Secondary | ICD-10-CM

## 2024-06-27 ENCOUNTER — Other Ambulatory Visit: Payer: Self-pay | Admitting: Cardiology

## 2024-06-27 DIAGNOSIS — Z794 Long term (current) use of insulin: Secondary | ICD-10-CM

## 2024-06-29 ENCOUNTER — Ambulatory Visit (HOSPITAL_COMMUNITY)
Admission: RE | Admit: 2024-06-29 | Discharge: 2024-06-29 | Disposition: A | Discharge status: Home / Self Care | Payer: BC Managed Care – PPO | Source: Ambulatory Visit | Attending: Cardiology | Admitting: Cardiology

## 2024-06-29 ENCOUNTER — Other Ambulatory Visit: Payer: BC Managed Care – PPO

## 2024-06-29 ENCOUNTER — Ambulatory Visit: Payer: Self-pay | Admitting: Cardiology

## 2024-06-29 ENCOUNTER — Other Ambulatory Visit (HOSPITAL_COMMUNITY): Payer: Self-pay

## 2024-06-29 DIAGNOSIS — I7121 Aneurysm of the ascending aorta, without rupture: Secondary | ICD-10-CM | POA: Insufficient documentation

## 2024-06-29 LAB — ECHOCARDIOGRAM COMPLETE
Area-P 1/2: 3.06 cm2
S' Lateral: 2.6 cm

## 2024-06-29 MED ORDER — MOUNJARO 15 MG/0.5ML ~~LOC~~ SOAJ
15.0000 mg | SUBCUTANEOUS | 3 refills | Status: AC
  Filled 2024-06-29: qty 6, 84d supply, fill #0
  Filled 2024-09-14: qty 6, 84d supply, fill #1

## 2024-06-29 NOTE — Progress Notes (Signed)
 Normal LV systolic function, ascending aorta aneurysm measuring 46 mm, unchanged from prior echocardiogram 06/26/2023.  I will discuss more at our office visit soon.

## 2024-07-01 DIAGNOSIS — E785 Hyperlipidemia, unspecified: Secondary | ICD-10-CM | POA: Diagnosis not present

## 2024-07-01 DIAGNOSIS — E1140 Type 2 diabetes mellitus with diabetic neuropathy, unspecified: Secondary | ICD-10-CM | POA: Diagnosis not present

## 2024-07-01 DIAGNOSIS — I10 Essential (primary) hypertension: Secondary | ICD-10-CM | POA: Diagnosis not present

## 2024-07-01 DIAGNOSIS — Z79899 Other long term (current) drug therapy: Secondary | ICD-10-CM | POA: Diagnosis not present

## 2024-07-02 LAB — LAB REPORT - SCANNED
A1c: 8.3
EGFR: 96

## 2024-07-08 DIAGNOSIS — I712 Thoracic aortic aneurysm, without rupture, unspecified: Secondary | ICD-10-CM | POA: Diagnosis not present

## 2024-07-08 DIAGNOSIS — M791 Myalgia, unspecified site: Secondary | ICD-10-CM | POA: Diagnosis not present

## 2024-07-08 DIAGNOSIS — E785 Hyperlipidemia, unspecified: Secondary | ICD-10-CM | POA: Diagnosis not present

## 2024-07-08 DIAGNOSIS — E1140 Type 2 diabetes mellitus with diabetic neuropathy, unspecified: Secondary | ICD-10-CM | POA: Diagnosis not present

## 2024-07-09 ENCOUNTER — Ambulatory Visit: Payer: Self-pay | Admitting: Cardiology

## 2024-07-27 DIAGNOSIS — M961 Postlaminectomy syndrome, not elsewhere classified: Secondary | ICD-10-CM | POA: Diagnosis not present

## 2024-07-27 DIAGNOSIS — M47812 Spondylosis without myelopathy or radiculopathy, cervical region: Secondary | ICD-10-CM | POA: Diagnosis not present

## 2024-08-20 ENCOUNTER — Ambulatory Visit: Attending: Cardiology | Admitting: Cardiology

## 2024-08-20 ENCOUNTER — Encounter: Payer: Self-pay | Admitting: Cardiology

## 2024-08-20 VITALS — BP 104/67 | HR 73 | Resp 16 | Ht 65.0 in | Wt 257.2 lb

## 2024-08-20 DIAGNOSIS — E7800 Pure hypercholesterolemia, unspecified: Secondary | ICD-10-CM | POA: Diagnosis not present

## 2024-08-20 DIAGNOSIS — R931 Abnormal findings on diagnostic imaging of heart and coronary circulation: Secondary | ICD-10-CM

## 2024-08-20 DIAGNOSIS — E1165 Type 2 diabetes mellitus with hyperglycemia: Secondary | ICD-10-CM

## 2024-08-20 DIAGNOSIS — I7121 Aneurysm of the ascending aorta, without rupture: Secondary | ICD-10-CM

## 2024-08-20 DIAGNOSIS — I10 Essential (primary) hypertension: Secondary | ICD-10-CM | POA: Diagnosis not present

## 2024-08-20 DIAGNOSIS — Z794 Long term (current) use of insulin: Secondary | ICD-10-CM

## 2024-08-20 MED ORDER — ATORVASTATIN CALCIUM 10 MG PO TABS: 10.0000 mg | ORAL_TABLET | Freq: Every day | ORAL | 3 refills | Status: AC

## 2024-08-20 NOTE — Progress Notes (Signed)
 Cardiology Office Note:  .   Date:  08/22/2024  ID:  Lance Frazier, DOB [birth year], MRN [id number] PCP: Sheryle Carwin, MD  [facility] Providers Cardiologist:  None   History of Present Illness: Lance Frazier   Lance Frazier is a 63 y.o. Caucasian male patient with elevated coronary calcium  score in the 88th percentile on 05/27/2022, moderate ascending aortic aneurysm aorta measuring around 4.5 cm, diabetes mellitus, OSA on CPAP and compliant, hyperlipidemia, primary hypertension, chronic back pain, has spinal stimulator placed sometime in 2015 presents for annual visit.  His main concern is severe back pain and Achilles tendinitis leading to decreased physical activity.  I had discontinued his rosuvastatin  as he had complained of severe myalgias.  Cardiac Studies relevent.    ECHOCARDIOGRAM COMPLETE 06/29/2024  1. Left ventricular ejection fraction, by estimation, is 65 to 70%. The left ventricle has normal function. The left ventricle has no regional wall motion abnormalities. There is mild concentric left ventricular hypertrophy. Left ventricular diastolic parameters are consistent with Grade I diastolic dysfunction (impaired relaxation). 2. Right ventricular systolic function is normal. The right ventricular size is normal. Tricuspid regurgitation signal is inadequate for assessing PA pressure. 3. Left atrial size was mildly dilated. 4. Aortic dilatation noted. There is mild dilatation of the aortic root, measuring 39 mm. There is moderate dilatation of the ascending aorta, measuring 46 mm.  5.  No significant change from 06/26/2023 and 12/17/2022.  Exercise Sestamibi stress test 05/30/2022: Exercise nuclear stress test was performed using Bruce protocol. Exercise time 7 minutes 2 seconds, achieved 8.62 METS, 89% APMHR. Stress ECG negative for ischemia. Normal myocardial perfusion with apical thinning artifact (normal variant), no obvious evidence of reversible myocardial ischemia or prior  infarct. Left ventricular size preserved, calculated LVEF 71%, no regional wall motion abnormalities.  CT CARDIAC CORONARY ARTERY CALCIUM  SCORE 05/27/2022:  Total Agatston Score: 503. MESA database percentile: 69      Discussed the use of AI scribe software for clinical note transcription with the patient, who gave verbal consent to proceed.  History of Present Illness Lance Frazier is a 63 year old male with diabetes and hyperlipidemia who presents for cardiovascular follow-up.  He is managing diabetes with Mounjaro , switched from Ozempic  in August 2024, with a temporary pause during travel. His A1c remains elevated. He discontinued rosuvastatin  due to severe back and muscle pain, with significant pain improvement post-discontinuation, and is not on any cholesterol medication currently. An aortic aneurysm has remained stable at 4.6 cm since January 2024. Extensive travel over the summer disrupted his routine and medication adherence.  Labs     Lab Results  Component Value Date   TSH 1.370 07/19/2023    Care everywhere/Faxed External Labs:  Labs 07/02/2023:  Hb 15.5/HCT 47.6, platelets 158, normal indicis.  Serum glucose 233, BUN 14, creatinine 0.89, eGFR 96 mL, potassium 4.5, LFTs normal.  Total cholesterol 181, triglycerides 385, HDL 30, LDL 88.  A1c 8.3%.  ROS  Review of Systems  Cardiovascular:  Negative for chest pain, dyspnea on exertion and leg swelling.  Musculoskeletal:  Positive for arthritis and back pain.   Physical Exam:   VS:  BP 104/67 (BP Location: Left Arm, Patient Position: Sitting, Cuff Size: Large)   Pulse 73   Resp 16   Ht 5' 5 (1.651 m)   Wt 257 lb 3.2 oz (116.7 kg)   SpO2 94%   BMI 42.80 kg/m    Wt Readings from Last 3 Encounters:  08/20/24 257 lb  3.2 oz (116.7 kg)  07/04/23 259 lb (117.5 kg)  06/12/23 263 lb 3.7 oz (119.4 kg)    BP Readings from Last 3 Encounters:  08/20/24 104/67  03/10/24 135/79  08/08/23 (!) 110/54   Physical  Exam Constitutional:      Appearance: He is morbidly obese.  Neck:     Vascular: No carotid bruit or JVD.  Cardiovascular:     Rate and Rhythm: Normal rate and regular rhythm.     Pulses: Intact distal pulses.     Heart sounds: Normal heart sounds. No murmur heard.    No gallop.  Pulmonary:     Effort: Pulmonary effort is normal.     Breath sounds: Normal breath sounds.  Abdominal:     General: Bowel sounds are normal.     Palpations: Abdomen is soft.  Musculoskeletal:     Right lower leg: No edema.     Left lower leg: No edema.    EKG:    EKG Interpretation Date/Time:  Thursday August 20 2024 09:32:44 EDT Ventricular Rate:  77 PR Interval:  184 QRS Duration:  100 QT Interval:  388 QTC Calculation: 439 R Axis:   41  Text Interpretation: Normal sinus rhythm Low voltage QRS Inferior infarct (cited on or before 10-Aug-2014) When compared with ECG of 07/04/2023, No significant change since Left posterior fascicular block is no longer Present Confirmed by Ladona Milan [phone]) on 08/22/2024 7:18:35 PM  EKG 07/04/2023: Normal sinus rhythm with rate of 76 bpm, normal axis, incomplete right bundle branch block. Low-voltage complexes.   ASSESSMENT AND PLAN: .      ICD-10-CM   1. Aneurysm of ascending aorta without rupture  I71.21 EKG 12-Lead    AMB Referral to [facility]    ECHOCARDIOGRAM COMPLETE    2. Elevated coronary artery calcium  score 05/27/22: Total Score: 503. MESA database percentile: 88  R93.1 AMB Referral to [facility] Pharm-D    3. Primary hypertension  I10 AMB Referral to [facility] Pharm-[facility]    ECHOCARDIOGRAM COMPLETE    4. Hypercholesteremia  E78.00 AMB Referral to [facility] Pharm-D    5. Type 2 diabetes mellitus with hyperglycemia, with long-term current use of insulin  (HCC)  E11.65 AMB Referral to [facility] Pharm-D   Z79.4       Assessment & Plan Aneurysm of ascending aorta, without rupture Aortic aneurysm measures 4.6 cm, below the surgical  threshold of 6 cm, unchanged since January 2024. Blood pressure, cholesterol, and diabetes control are crucial to prevent aneurysm growth. - Continue monitoring with echocardiogram in one year.  Type 2 diabetes mellitus with hyperglycemia Diabetes is poorly controlled with an A1c of 8.8, exceeding the target of 7 or less. Long-term effects on eyes, kidneys, and brain are a concern. - Refer to clinical pharmacist Robbi for diabetes management in [place]. - Maintain current diabetes medications until reviewed by the clinical pharmacist.  Pure hypercholesterolemia, statin intolerance Cholesterol levels are slightly elevated. Previous intolerance to rosuvastatin  due to muscle pain. - Prescribe Lipitor 10 mg daily. - Monitor for any muscle pain or back pain exacerbation. - Will have our clinical pharmacist to also check lipids during follow up if lipitor tolerated.   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 Physical activity is limited due to pain and recent Achilles tendinitis. - Refer to [facility] for specific physical therapy for back pain and or surgical considerations. - Encourage joining a gym and working with a Psychologist, occupational for  overall health and mobility. - Letter sent to Chyrl Budge  Essential (primary) hypertension Blood pressure control is essential to prevent complications related to the aortic aneurysm and diabetes.  Goals of Care Emphasized managing diabetes and cardiovascular health to prevent long-term complications. Lifestyle changes and adherence to treatment plans are crucial for longevity and quality of life.  Follow up: 1 Year with repeat Echocardiogram for Asc Ao Aneurysm  Signed,  Gordy Bergamo, MD, [facility] 08/22/2024, 7:19 PM [facility] [address] [place], [place] [address] Phone: [phone]. Fax:  [phone]

## 2024-08-20 NOTE — Patient Instructions (Addendum)
 Medication Instructions:  START Lipitor 10mg  Take 1 tablet once a day  *If you need a refill on your cardiac medications before your next appointment, please call your pharmacy*  Lab Work: None ordered If you have labs (blood work) drawn today and your tests are completely normal, you will receive your results only by: MyChart Message (if you have MyChart) OR A paper copy in the mail If you have any lab test that is abnormal or we need to change your treatment, we will call you to review the results.  Testing/Procedures: SCHEDULE ECHO OCTOBER 2026 Your physician has requested that you have an echocardiogram. Echocardiography is a painless test that uses sound waves to create images of your heart. It provides your doctor with information about the size and shape of your heart and how well your heart's chambers and valves are working. This procedure takes approximately one hour. There are no restrictions for this procedure. Please do NOT wear cologne, perfume, aftershave, or lotions (deodorant is allowed). Please arrive 15 minutes prior to your appointment time.  Please note: We ask at that you not bring children with you during ultrasound (echo/ vascular) testing. Due to room size and safety concerns, children are not allowed in the ultrasound rooms during exams. Our front office staff cannot provide observation of children in our lobby area while testing is being conducted. An adult accompanying a patient to their appointment will only be allowed in the ultrasound room at the discretion of the ultrasound technician under special circumstances. We apologize for any inconvenience.  Follow-Up: At [facility], you and your health needs are our priority.  As part of our continuing mission to provide you with exceptional heart care, our providers are all part of one team.  This team includes your primary Cardiologist (physician) and Advanced Practice Providers or APPs (Physician Assistants  and Nurse Practitioners) who all work together to provide you with the care you need, when you need it.  Your next appointment:   12 month(s)  Provider:   Gordy Bergamo, MD   We recommend signing up for the patient portal called MyChart.  Sign up information is provided on this After Visit Summary.  MyChart is used to connect with patients for Virtual Visits (Telemedicine).  Patients are able to view lab/test results, encounter notes, upcoming appointments, etc.  Non-urgent messages can be sent to your provider as well.   To learn more about what you can do with MyChart, go to [email or web address].   Other Instructions You have been referred to PHARM-D APPT IN [place] [facility] DIABETES AND LIPIDS)

## 2024-08-22 ENCOUNTER — Encounter: Payer: Self-pay | Admitting: Cardiology

## 2024-09-04 DIAGNOSIS — Z6841 Body Mass Index (BMI) 40.0 and over, adult: Secondary | ICD-10-CM | POA: Diagnosis not present

## 2024-09-04 DIAGNOSIS — R29898 Other symptoms and signs involving the musculoskeletal system: Secondary | ICD-10-CM | POA: Diagnosis not present

## 2024-09-15 ENCOUNTER — Other Ambulatory Visit (HOSPITAL_COMMUNITY): Payer: Self-pay

## 2024-09-17 ENCOUNTER — Ambulatory Visit: Attending: Pharmacist | Admitting: Pharmacist

## 2024-09-17 NOTE — Progress Notes (Deleted)
 Patient ID: AZAD CALAME                 DOB: [birth year]                    MRN: [id number]      HPI: Lance Frazier is a 63 y.o. male patient referred to lipid clinic by [facility] . PMH is significant for OSA, T2DM, HLD, HTN.   Was not able to tolerate rosuvastatin  due to severe myalgia.  Reviewed options for lowering LDL cholesterol, including ezetimibe, PCSK-9 inhibitors, bempedoic acid and inclisiran.  Discussed mechanisms of action, dosing, side effects and potential decreases in LDL cholesterol.  Also reviewed cost information and potential options for patient assistance.  Current Medications: Atorvastatin  10 mg  Intolerances:  Risk Factors:  LDL goal: <55 mg/dl and TG <849 mg/dl  Last lab 91/84/7974 Tc 181, TG 385, HDLc 30, LDLc 88 while on what  Current diabetes meds: Tirzepatide  15 mg every 7 days and Jardiance 25 mg daily   Diet:   Exercise:   Family History:  Relation Problem Comments  Mother (Deceased at age 73) Heart disease     Father (Deceased) Coronary artery disease (Age: 23) Acute MI    Sister (Alive)   Maternal Aunt Heart disease     Social History:   Labs:  Lipid Panel     Component Value Date/Time   CHOL 134 07/19/2023 1037   TRIG 253 (H) 07/19/2023 1037   HDL 29 (L) 07/19/2023 1037   CHOLHDL 6.6 01/15/2014 1140   VLDL NOT CALC 01/15/2014 1140   LDLCALC 64 07/19/2023 1037   LDLDIRECT 65 07/19/2023 1037   LDLDIRECT 111 (H) 01/18/2014 1524   LABVLDL 41 (H) 07/19/2023 1037    Past Medical History:  Diagnosis Date   Anxiety    Arthritis    Diabetes mellitus without complication (HCC)    Fibromyalgia    Hyperlipidemia 05/28/2012   Hypertension    Marijuana use    Obesity    BMI of 40.4 at a weight of 265 pounds   Prostate cancer (HCC) 06/08/2022   Prostate cancer (HCC)    Sleep apnea     Current Outpatient Medications on File Prior to Visit  Medication Sig Dispense Refill   aspirin  (ASPIRIN  CHILDRENS) 81 MG chewable tablet Chew 1  tablet (81 mg total) by mouth daily.     atorvastatin  (LIPITOR) 10 MG tablet Take 1 tablet (10 mg total) by mouth daily. 90 tablet 3   baclofen (LIORESAL) 10 MG tablet Take 10 mg by mouth daily as needed for muscle spasms.     glimepiride (AMARYL) 4 MG tablet Take 2 mg by mouth daily with breakfast. Take 1/2 tab daily starting 07/04/23  3   glucose blood test strip 1 each by Other route See admin instructions. Check blood sugar daily.     ibuprofen (ADVIL) 400 MG tablet Take 400 mg by mouth as needed.     JARDIANCE 25 MG TABS tablet Take 25 mg by mouth daily.     losartan  (COZAAR ) 50 MG tablet TAKE 1 TABLET(50 MG) BY MOUTH EVERY EVENING 90 tablet 0   pioglitazone (ACTOS) 30 MG tablet Take 30 mg by mouth daily.     pregabalin (LYRICA) 200 MG capsule Take 200 mg by mouth 2 (two) times daily.     tadalafil  (CIALIS ) 20 MG tablet TAKE 1 TABLET(20 MG) BY MOUTH DAILY 30 tablet 3   tirzepatide  (MOUNJARO ) 15 MG/0.5ML Pen Inject  15 mg into the skin once a week. 6 mL 3   No current facility-administered medications on file prior to visit.    Allergies  Allergen Reactions   Crestor  [Rosuvastatin ] Other (See Comments)    Severe arthralgia and myalgia   Lisinopril  Cough    Assessment/Plan:  1. Hyperlipidemia -  No problems updated. No problem-specific Assessment & Plan notes found for this encounter.    Thank you,  Robbi Blanch, Pharm.D [place] [facility]. [facility] [address], [place], [place] [address] Phone: [phone]; Fax: [phone]

## 2024-09-19 ENCOUNTER — Other Ambulatory Visit: Payer: Self-pay | Admitting: Cardiology

## 2024-09-19 DIAGNOSIS — I10 Essential (primary) hypertension: Secondary | ICD-10-CM

## 2024-09-19 DIAGNOSIS — I7121 Aneurysm of the ascending aorta, without rupture: Secondary | ICD-10-CM

## 2024-09-28 DIAGNOSIS — R972 Elevated prostate specific antigen [PSA]: Secondary | ICD-10-CM | POA: Diagnosis not present

## 2024-09-29 ENCOUNTER — Other Ambulatory Visit: Payer: Self-pay

## 2024-10-01 ENCOUNTER — Other Ambulatory Visit (HOSPITAL_COMMUNITY): Payer: Self-pay

## 2024-10-01 ENCOUNTER — Encounter: Payer: Self-pay | Admitting: Pharmacist

## 2024-10-01 ENCOUNTER — Telehealth: Payer: Self-pay | Admitting: Pharmacy Technician

## 2024-10-01 ENCOUNTER — Ambulatory Visit: Attending: Cardiology | Admitting: Pharmacist

## 2024-10-01 ENCOUNTER — Telehealth: Payer: Self-pay | Admitting: Pharmacist

## 2024-10-01 DIAGNOSIS — E7849 Other hyperlipidemia: Secondary | ICD-10-CM | POA: Diagnosis not present

## 2024-10-01 DIAGNOSIS — M79672 Pain in left foot: Secondary | ICD-10-CM | POA: Diagnosis not present

## 2024-10-01 DIAGNOSIS — M7662 Achilles tendinitis, left leg: Secondary | ICD-10-CM | POA: Diagnosis not present

## 2024-10-01 NOTE — Telephone Encounter (Signed)
  I sent the coupon to [facility]

## 2024-10-01 NOTE — Telephone Encounter (Signed)
 Pharmacy Patient Advocate Encounter  Received notification from [facility] that Prior Authorization for vascepa-has to be brand has been APPROVED from 10/01/24 to 10/01/27. Ran test claim, Copay is $270.13. This test claim was processed through [facility]- copay amounts may vary at other pharmacies due to pharmacy/plan contracts, or as the patient moves through the different stages of their insurance plan.   PA #/Case ID/Reference #: [id number]    I sent the coupon to [facility]

## 2024-10-01 NOTE — Telephone Encounter (Signed)
   Insurance wants brand. Brand needs pa   Pharmacy Patient Advocate Encounter   Received notification from Pt Calls Messages that prior authorization for vascepa is required/requested.   Insurance verification completed.   The patient is insured through [facility].   Per test claim: PA required; PA submitted to above mentioned insurance via Latent Key/confirmation #/EOC [id number] Status is pending

## 2024-10-01 NOTE — Progress Notes (Signed)
 Patient ID: Lance Frazier                 DOB: 07-24-1961                    MRN: [id number]      HPI: Lance Frazier is a 63 y.o. male patient referred to lipid clinic by [facility]  PMH is significant for coronary calcium  score in the 88th percentile on 05/27/2022, moderate ascending aortic aneurysm aorta measuring around 4.5 cm, diabetes mellitus, OSA on CPAP and compliant, hyperlipidemia, primary hypertension, chronic back pain, has spinal stimulator placed sometime in 2015 presented today for lipid clinic.   Patient reported severe myalgia from Rosuvastatin  40 mg daily  - dose were reduced to 3 times per week . He can't tolerate that either. Patient was prescribed Lipitor 10 mg daily which he tolerates well. Tendonitis - getting steroid - oral can affect the BG level checks BG once a week. BG 150 mg/dl. He eats out most meals and drink sugary beverages. He has sweet tooth- snack on chocolates and enjoys chips too.  Reviewed options for lowering LDL cholesterol, including ezetimibe, PCSK-9 inhibitors, bempedoic acid and inclisiran.  Discussed mechanisms of action, dosing, side effects and potential decreases in LDL cholesterol.  Also reviewed cost information and potential options for patient assistance.  We also reviewed elevated TG and medication that helps to lower TG. Per wife will be getting new Medicare insurance stating from Dec so want to wait for PA on both PCSK9i and Vascepa  Current Medications: Lipitor 10 mg daily  Intolerances: Crestor  40 gm daily and 40 mg 3 times per week  Risk Factors: CAD, T2DM  LDL goal: <55 mg/dl TG <849  Last lab: TC 818, TG 385, HDLc 30, LDLc 88  Diet: Breakfast: coffee with cream and sugar  pre made 400 calories from 2 cups  Lunch Chicken and fries  Dinner: soups, some vegetables  Snack: chocolate, chips  Drink: Gatorade    Exercise: none - multiple joint injuries ( upper body and lower body)   Family History:  Relation Problem Comments  Mother  (Deceased at age 78) Heart disease     Father (Deceased) Coronary artery disease (Age: 33) Acute MI    Sister Metallurgist)   Maternal Aunt     Social History:  Alcohol- none  Smoking: none  Labs:  Lipid Panel     Component Value Date/Time   CHOL 134 07/19/2023 1037   TRIG 253 (H) 07/19/2023 1037   HDL 29 (L) 07/19/2023 1037   CHOLHDL 6.6 01/15/2014 1140   VLDL NOT CALC 01/15/2014 1140   LDLCALC 64 07/19/2023 1037   LDLDIRECT 65 07/19/2023 1037   LDLDIRECT 111 (H) 01/18/2014 1524   LABVLDL 41 (H) 07/19/2023 1037    Past Medical History:  Diagnosis Date   Anxiety    Arthritis    Diabetes mellitus without complication (HCC)    Fibromyalgia    Hyperlipidemia 05/28/2012   Hypertension    Marijuana use    Obesity    BMI of 40.4 at a weight of 265 pounds   Prostate cancer (HCC) 06/08/2022   Prostate cancer (HCC)    Sleep apnea     Current Outpatient Medications on File Prior to Visit  Medication Sig Dispense Refill   aspirin  (ASPIRIN  CHILDRENS) 81 MG chewable tablet Chew 1 tablet (81 mg total) by mouth daily.     atorvastatin  (LIPITOR) 10 MG tablet Take 1 tablet (10 mg  total) by mouth daily. 90 tablet 3   baclofen (LIORESAL) 10 MG tablet Take 10 mg by mouth daily as needed for muscle spasms.     glimepiride (AMARYL) 4 MG tablet Take 2 mg by mouth daily with breakfast. Take 1/2 tab daily starting 07/04/23  3   glucose blood test strip 1 each by Other route See admin instructions. Check blood sugar daily.     ibuprofen (ADVIL) 400 MG tablet Take 400 mg by mouth as needed.     JARDIANCE 25 MG TABS tablet Take 25 mg by mouth daily.     losartan  (COZAAR ) 50 MG tablet Take 1 tablet (50 mg total) by mouth every evening. 90 tablet 3   pioglitazone (ACTOS) 30 MG tablet Take 30 mg by mouth daily.     pregabalin (LYRICA) 200 MG capsule Take 200 mg by mouth 2 (two) times daily.     tadalafil  (CIALIS ) 20 MG tablet TAKE 1 TABLET(20 MG) BY MOUTH DAILY 30 tablet 3   tirzepatide  (MOUNJARO )  15 MG/0.5ML Pen Inject 15 mg into the skin once a week. 6 mL 3   No current facility-administered medications on file prior to visit.    Allergies  Allergen Reactions   Crestor  [Rosuvastatin ] Other (See Comments)    Severe arthralgia and myalgia   Lisinopril  Cough    Assessment/Plan:  1. Hyperlipidemia -  Problem  Hyperlipidemia   Current Medications: Lipitor 10 mg daily  Intolerances: Crestor  40 gm daily and 40 mg 3 times per week  Risk Factors: CAD, T2DM  LDL goal: <55 mg/dl TG <849  Last lab: TC 818, TG 385, HDLc 30, LDLc 88    Hyperlipidemia Assessment:  LDL goal: < 55 mg/dl last LDLc 88 mg/dl while on Crestor  40 mg daily  Tolerates moderate/high intensity statins well without any side effects  Intolerance to high intensity statins  Discussed next potential options (Zetia PCSK-9 inhibitors, bempedoic acid and inclisiran); cost, dosing efficacy, side effects  Diet needs lots of improvement. Exercise capacity is limited due to previous muscle injury/ joint injury related pain   Plan: Continue taking current medications (atorvastatin  10 mg daily) Patient new insurance will in effect starting from Dec 1 will assess coverage for Vascepa and Repatha to lower TG and LDLc to goal    2. Type 2 diabetes  Assessment and plan:  BG uncontrolled - short term steroid use due to joint injuy. Currently on pioglitazone 30 mg daily , tirzepatide  15 mg once a week, Jardiance 25 mg daily glimepiride 4 mg daily with breakfast. Patient diet needs lots of improvement - discussed diet in detail. Patient will make changes and want to follow up back in 2 months    Thank you,  Robbi Blanch, Pharm.D [place] [facility]. [facility] [address], [place], [place] [address] Phone: [phone]; Fax: [phone]

## 2024-10-01 NOTE — Assessment & Plan Note (Signed)
 Assessment:  LDL goal: < 55 mg/dl last LDLc 88 mg/dl while on Crestor  40 mg daily  Tolerates moderate/high intensity statins well without any side effects  Intolerance to high intensity statins  Discussed next potential options (Zetia PCSK-9 inhibitors, bempedoic acid and inclisiran); cost, dosing efficacy, side effects  Diet needs lots of improvement. Exercise capacity is limited due to previous muscle injury/ joint injury related pain   Plan: Continue taking current medications (atorvastatin  10 mg daily) Patient new insurance will in effect starting from Dec 1 will assess coverage for Vascepa and Repatha to lower TG and LDLc to goal

## 2024-10-10 ENCOUNTER — Other Ambulatory Visit: Payer: Self-pay | Admitting: Cardiology

## 2024-10-10 DIAGNOSIS — N5235 Erectile dysfunction following radiation therapy: Secondary | ICD-10-CM

## 2024-10-13 NOTE — Telephone Encounter (Signed)
 Pt of Dr. Ladona. Does Dr. Ladona want to refill this RX? Please advise.

## 2024-10-13 NOTE — Telephone Encounter (Signed)
 Patient will be changing insurance will wit till new year to start Vascepa will work on lifestyle meanwhile

## 2024-10-20 ENCOUNTER — Other Ambulatory Visit (HOSPITAL_COMMUNITY): Payer: Self-pay

## 2024-10-20 ENCOUNTER — Telehealth: Payer: Self-pay | Admitting: Pharmacist

## 2024-10-20 ENCOUNTER — Telehealth: Payer: Self-pay | Admitting: Pharmacy Technician

## 2024-10-20 MED ORDER — VASCEPA 1 G PO CAPS: 2.0000 g | ORAL_CAPSULE | Freq: Two times a day (BID) | ORAL | 11 refills | Status: AC

## 2024-10-20 MED ORDER — REPATHA SURECLICK 140 MG/ML ~~LOC~~ SOAJ: 140.0000 mg | SUBCUTANEOUS | 3 refills | Status: AC

## 2024-10-20 NOTE — Telephone Encounter (Signed)
 Pharmacy Patient Advocate Encounter   Received notification from Physician's Office that prior authorization for Repatha is required/requested.   Insurance verification completed.   The patient is insured through [employer].   Per test claim: The current 10/20/24 day co-pay is, $45.00- one month.  No PA needed at this time. This test claim was processed through [facility]- copay amounts may vary at other pharmacies due to pharmacy/plan contracts, or as the patient moves through the different stages of their insurance plan.

## 2024-10-20 NOTE — Telephone Encounter (Signed)
 Pharmacy Patient Advocate Encounter  Insurance verification completed.   The patient is insured through [employer] test claim for [employer]. Currently a quantity of 120 is a 30 day supply and the co-pay is 45.00 . The current 10/20/24 day co-pay is, $45.00.  No PA needed at this time.  This test claim was processed through [facility]- copay amounts may vary at other pharmacies due to pharmacy/plan contracts, or as the patient moves through the different stages of their insurance plan.

## 2024-10-20 NOTE — Telephone Encounter (Signed)
 New insurance info requested to assess coverage for Repatha and Vascepa

## 2024-10-20 NOTE — Addendum Note (Signed)
 Addended by: Chevon Fomby K on: 10/20/2024 02:53 PM   Modules accepted: Orders

## 2024-10-26 ENCOUNTER — Ambulatory Visit (INDEPENDENT_AMBULATORY_CARE_PROVIDER_SITE_OTHER): Admitting: Audiology

## 2024-10-26 ENCOUNTER — Ambulatory Visit (INDEPENDENT_AMBULATORY_CARE_PROVIDER_SITE_OTHER): Admitting: Otolaryngology

## 2024-10-26 ENCOUNTER — Encounter (INDEPENDENT_AMBULATORY_CARE_PROVIDER_SITE_OTHER): Payer: Self-pay | Admitting: Otolaryngology

## 2024-10-26 VITALS — HR 88 | Temp 98.9°F | Ht 65.0 in | Wt 266.0 lb

## 2024-10-26 DIAGNOSIS — H6122 Impacted cerumen, left ear: Secondary | ICD-10-CM | POA: Insufficient documentation

## 2024-10-26 DIAGNOSIS — H90A12 Conductive hearing loss, unilateral, left ear with restricted hearing on the contralateral side: Secondary | ICD-10-CM

## 2024-10-26 DIAGNOSIS — H90A32 Mixed conductive and sensorineural hearing loss, unilateral, left ear with restricted hearing on the contralateral side: Secondary | ICD-10-CM

## 2024-10-26 DIAGNOSIS — J343 Hypertrophy of nasal turbinates: Secondary | ICD-10-CM

## 2024-10-26 DIAGNOSIS — H6522 Chronic serous otitis media, left ear: Secondary | ICD-10-CM | POA: Insufficient documentation

## 2024-10-26 DIAGNOSIS — H903 Sensorineural hearing loss, bilateral: Secondary | ICD-10-CM | POA: Diagnosis not present

## 2024-10-26 DIAGNOSIS — J310 Chronic rhinitis: Secondary | ICD-10-CM | POA: Insufficient documentation

## 2024-10-26 DIAGNOSIS — H6982 Other specified disorders of Eustachian tube, left ear: Secondary | ICD-10-CM | POA: Insufficient documentation

## 2024-10-26 DIAGNOSIS — H748X2 Other specified disorders of left middle ear and mastoid: Secondary | ICD-10-CM

## 2024-10-26 MED ORDER — FLUTICASONE PROPIONATE 50 MCG/ACT NA SUSP: 2.0000 | Freq: Every day | NASAL | 10 refills | Status: AC

## 2024-10-26 NOTE — Progress Notes (Unsigned)
  [address], [address] [place], [place] [address] [phone]  Audiological Evaluation    Name: Lance Frazier     DOB:   [birth year]      MRN:   [id number]                                                                                     Service Date: 10/26/2024     Accompanied by: wife   Patient comes today after Dr. Karis, ENT sent a referral for a hearing evaluation due to concerns with hearing loss.   Symptoms Yes Details  Hearing loss  [x]  No change since last hearing test, left ear worse. Previous hearing test completed at Dr. Rojean clinic in 11-27-2021   Tinnitus  []    Ear pain/ infections/pressure  [x]  Left ear, one month he had fluid come out below/behind his ear (mastoid area)   Balance problems  []    Noise exposure history  []    Previous ear surgeries  []    Family history of hearing loss  []    Amplification  []    Other  [x]  COVID cold 3 years ago and since then having more hearing issues    Otoscopy: Right ear: Abnormal eardrum appearance. Left ear:  Abnormal eardrum appearance.  Tympanometry: Right ear: Type B - Normal external ear canal volume with no middle ear pressure peak or tympanic membrane compliance.  Findings are consistent with abnormal middle ear function.  Left ear: Type C - Normal external ear canal volume with negative middle ear pressure and normal or reduced tympanic membrane compliance. Findings are consistent with Eustachian tube dysfunction.     Hearing Evaluation The hearing test results were completed under headphones and results are deemed to be of good to fair reliability. Test technique:  conventional    Pure tone Audiometry: Right ear- Borderline normal to severe sensorineural hearing loss from 250 Hz - 8000 Hz. Left ear-  Mild to profound mixed hearing loss from 250 Hz - 8000 Hz.  Speech Audiometry: Right ear- Speech Reception Threshold (SRT) was obtained at 35 dBHL. Left ear-Speech Reception Threshold (SRT) was obtained at  55 dBHL.   Word Recognition Score Tested using NU-6 (recorded) Right ear: 88% was obtained at a presentation level of 75 dBHL with contralateral masking which is deemed as  good . Left ear: 88% was obtained at a presentation level of 95 dBHL with contralateral masking which is deemed as  good .     Recommendations: Follow up with ENT as scheduled. Repeat audiogram after medical care. Hearing aid evaluation may be considered after medica care/clearance and re-checking his hearing.   Favour Aleshire MARIE LEROUX-MARTINEZ, AUD

## 2024-10-26 NOTE — Progress Notes (Signed)
 Patient ID: Lance Frazier, male   DOB: 06/09/1961, 63 y.o.   MRN: [id number]  Follow-up: Bilateral hearing loss, chronic nasal congestion  HPI: The patient is a 63 year old male who presents today complaining of progressive hearing difficulty.  The patient was last seen in 2023.  At that time, he was noted to have chronic rhinitis and bilateral inferior turbinate hypertrophy.  He was also noted to have bilateral high-frequency sensorineural hearing loss.  At one point 2 years ago, he was having bilateral middle ear effusion, causing conductive hearing loss.  He was successfully treated with Flonase  nasal spray.  The patient returns today complaining of progressive worsening of his hearing.  He has also noted increasing nasal congestion.  He is no longer using Flonase  nasal spray.  He denies any otalgia, otorrhea, facial pain, or fever.  Exam: General: Communicates without difficulty, well nourished, no acute distress. Head: Normocephalic, no evidence injury, no tenderness, facial buttresses intact without stepoff. Face/sinus: No tenderness to palpation and percussion. Facial movement is normal and symmetric. Eyes: PERRL, EOMI. No scleral icterus, conjunctivae clear. Neuro: CN II exam reveals vision grossly intact.  No nystagmus at any point of gaze. Ears: Auricles well formed without lesions.  Left ear cerumen impaction.  Nose: External evaluation reveals normal support and skin without lesions.  Dorsum is intact.  Anterior rhinoscopy reveals congested mucosa over anterior aspect of inferior turbinates and intact septum.  No purulence noted. Oral:  Oral cavity and oropharynx are intact, symmetric, without erythema or edema.  Mucosa is moist without lesions. Neck: Full range of motion without pain.  There is no significant lymphadenopathy.  No masses palpable.  Thyroid  bed within normal limits to palpation.  Parotid glands and submandibular glands equal bilaterally without mass.  Trachea is midline. Neuro:   CN 2-12 grossly intact.   Procedure: Left ear cerumen disimpaction Anesthesia: None Description: Under the operating microscope, the cerumen is carefully removed with a combination of cerumen currette, alligator forceps, and suction catheters.  After the cerumen is removed, the left tympanic membrane is noted to be intact but retracted, with left middle ear effusion.  No mass, erythema, or lesions. The patient tolerated the procedure well.    His hearing test shows bilateral high-frequency sensorineural hearing loss, with additional conductive component on the left side.  Assessment: 1.  Chronic rhinitis with nasal mucosal congestion and bilateral inferior turbinate hypertrophy. 2.  Left ear cerumen impaction.  After the cerumen removal procedure, the left tympanic membrane is noted to be retracted, with left middle ear effusion. 3.  Bilateral high-frequency sensorineural hearing loss, with additional conductive hearing loss component on the left side.  Plan: 1.  Otomicroscopy with left ear cerumen disimpaction. 2.  The physical exam findings and the hearing test results are reviewed with the patient and his wife. 3.  Flonase  nasal spray 2 sprays each nostril daily.  The importance of consistent daily use is discussed. 4.  The patient will return for reevaluation in 2 months.

## 2024-10-30 ENCOUNTER — Encounter (INDEPENDENT_AMBULATORY_CARE_PROVIDER_SITE_OTHER): Payer: Self-pay

## 2024-11-02 NOTE — Telephone Encounter (Signed)
 Contacted the patient to confirm pickup of Repatha  and Vascepa . The patient reported that both prescriptions were collected and that he paid $45 for each, which was significantly lower than the initial estimate of approximately $200 foVascepa .

## 2024-12-11 ENCOUNTER — Ambulatory Visit

## 2024-12-24 ENCOUNTER — Other Ambulatory Visit (INDEPENDENT_AMBULATORY_CARE_PROVIDER_SITE_OTHER): Payer: Self-pay | Admitting: Otolaryngology

## 2024-12-24 DIAGNOSIS — H903 Sensorineural hearing loss, bilateral: Secondary | ICD-10-CM

## 2024-12-25 ENCOUNTER — Other Ambulatory Visit (HOSPITAL_COMMUNITY): Payer: Self-pay

## 2024-12-29 ENCOUNTER — Ambulatory Visit (INDEPENDENT_AMBULATORY_CARE_PROVIDER_SITE_OTHER): Admitting: Audiology

## 2024-12-29 ENCOUNTER — Ambulatory Visit (INDEPENDENT_AMBULATORY_CARE_PROVIDER_SITE_OTHER): Admitting: Otolaryngology

## 2025-01-21 ENCOUNTER — Ambulatory Visit

## 2025-08-19 ENCOUNTER — Other Ambulatory Visit (HOSPITAL_COMMUNITY)
